# Patient Record
Sex: Male | Born: 1960 | Race: Black or African American | Hispanic: No | Marital: Married | Smoking: Never smoker
Health system: Southern US, Community
[De-identification: ages and names within clinical notes are randomized; demographics above are authoritative.]

## PROBLEM LIST (undated history)

## (undated) DIAGNOSIS — T7840XA Allergy, unspecified, initial encounter: Secondary | ICD-10-CM

## (undated) DIAGNOSIS — K603 Anal fistula, unspecified: Secondary | ICD-10-CM

## (undated) DIAGNOSIS — M1990 Unspecified osteoarthritis, unspecified site: Secondary | ICD-10-CM

## (undated) DIAGNOSIS — I10 Essential (primary) hypertension: Secondary | ICD-10-CM

## (undated) DIAGNOSIS — Z87442 Personal history of urinary calculi: Secondary | ICD-10-CM

## (undated) HISTORY — PX: HERNIA REPAIR: SHX51

## (undated) HISTORY — DX: Unspecified osteoarthritis, unspecified site: M19.90

## (undated) HISTORY — DX: Allergy, unspecified, initial encounter: T78.40XA

## (undated) HISTORY — DX: Essential (primary) hypertension: I10

---

## 1998-08-07 ENCOUNTER — Emergency Department (HOSPITAL_COMMUNITY): Admission: EM | Admit: 1998-08-07 | Discharge: 1998-08-07 | Payer: Self-pay | Admitting: Emergency Medicine

## 1998-08-07 ENCOUNTER — Encounter: Payer: Self-pay | Admitting: Emergency Medicine

## 2011-03-08 ENCOUNTER — Ambulatory Visit (INDEPENDENT_AMBULATORY_CARE_PROVIDER_SITE_OTHER): Payer: BC Managed Care – PPO

## 2011-03-08 DIAGNOSIS — R609 Edema, unspecified: Secondary | ICD-10-CM

## 2011-11-27 ENCOUNTER — Ambulatory Visit: Payer: BC Managed Care – PPO

## 2011-11-27 ENCOUNTER — Ambulatory Visit (INDEPENDENT_AMBULATORY_CARE_PROVIDER_SITE_OTHER): Payer: BC Managed Care – PPO | Admitting: Family Medicine

## 2011-11-27 VITALS — BP 135/83 | HR 86 | Temp 98.0°F | Resp 18 | Ht 71.0 in | Wt 257.0 lb

## 2011-11-27 DIAGNOSIS — N200 Calculus of kidney: Secondary | ICD-10-CM

## 2011-11-27 DIAGNOSIS — M79672 Pain in left foot: Secondary | ICD-10-CM

## 2011-11-27 DIAGNOSIS — L0231 Cutaneous abscess of buttock: Secondary | ICD-10-CM

## 2011-11-27 DIAGNOSIS — M79609 Pain in unspecified limb: Secondary | ICD-10-CM

## 2011-11-27 DIAGNOSIS — R109 Unspecified abdominal pain: Secondary | ICD-10-CM

## 2011-11-27 LAB — POCT UA - MICROSCOPIC ONLY
Bacteria, U Microscopic: NEGATIVE
Casts, Ur, LPF, POC: NEGATIVE
Crystals, Ur, HPF, POC: NEGATIVE
Epithelial cells, urine per micros: NEGATIVE
Yeast, UA: NEGATIVE

## 2011-11-27 LAB — POCT URINALYSIS DIPSTICK
Bilirubin, UA: NEGATIVE
Blood, UA: NEGATIVE
Glucose, UA: NEGATIVE
Ketones, UA: NEGATIVE
Leukocytes, UA: NEGATIVE
Nitrite, UA: NEGATIVE
Protein, UA: NEGATIVE
Spec Grav, UA: 1.025
Urobilinogen, UA: 0.2
pH, UA: 5.5

## 2011-11-27 IMAGING — CR DG ABDOMEN 1V
1 series · 1 of 1 positions shown · non-contrast
Comparison: None.

CLINICAL DATA: Left sided abdominal pain, history of kidney stones

ABDOMEN - 1 VIEW

[AP]
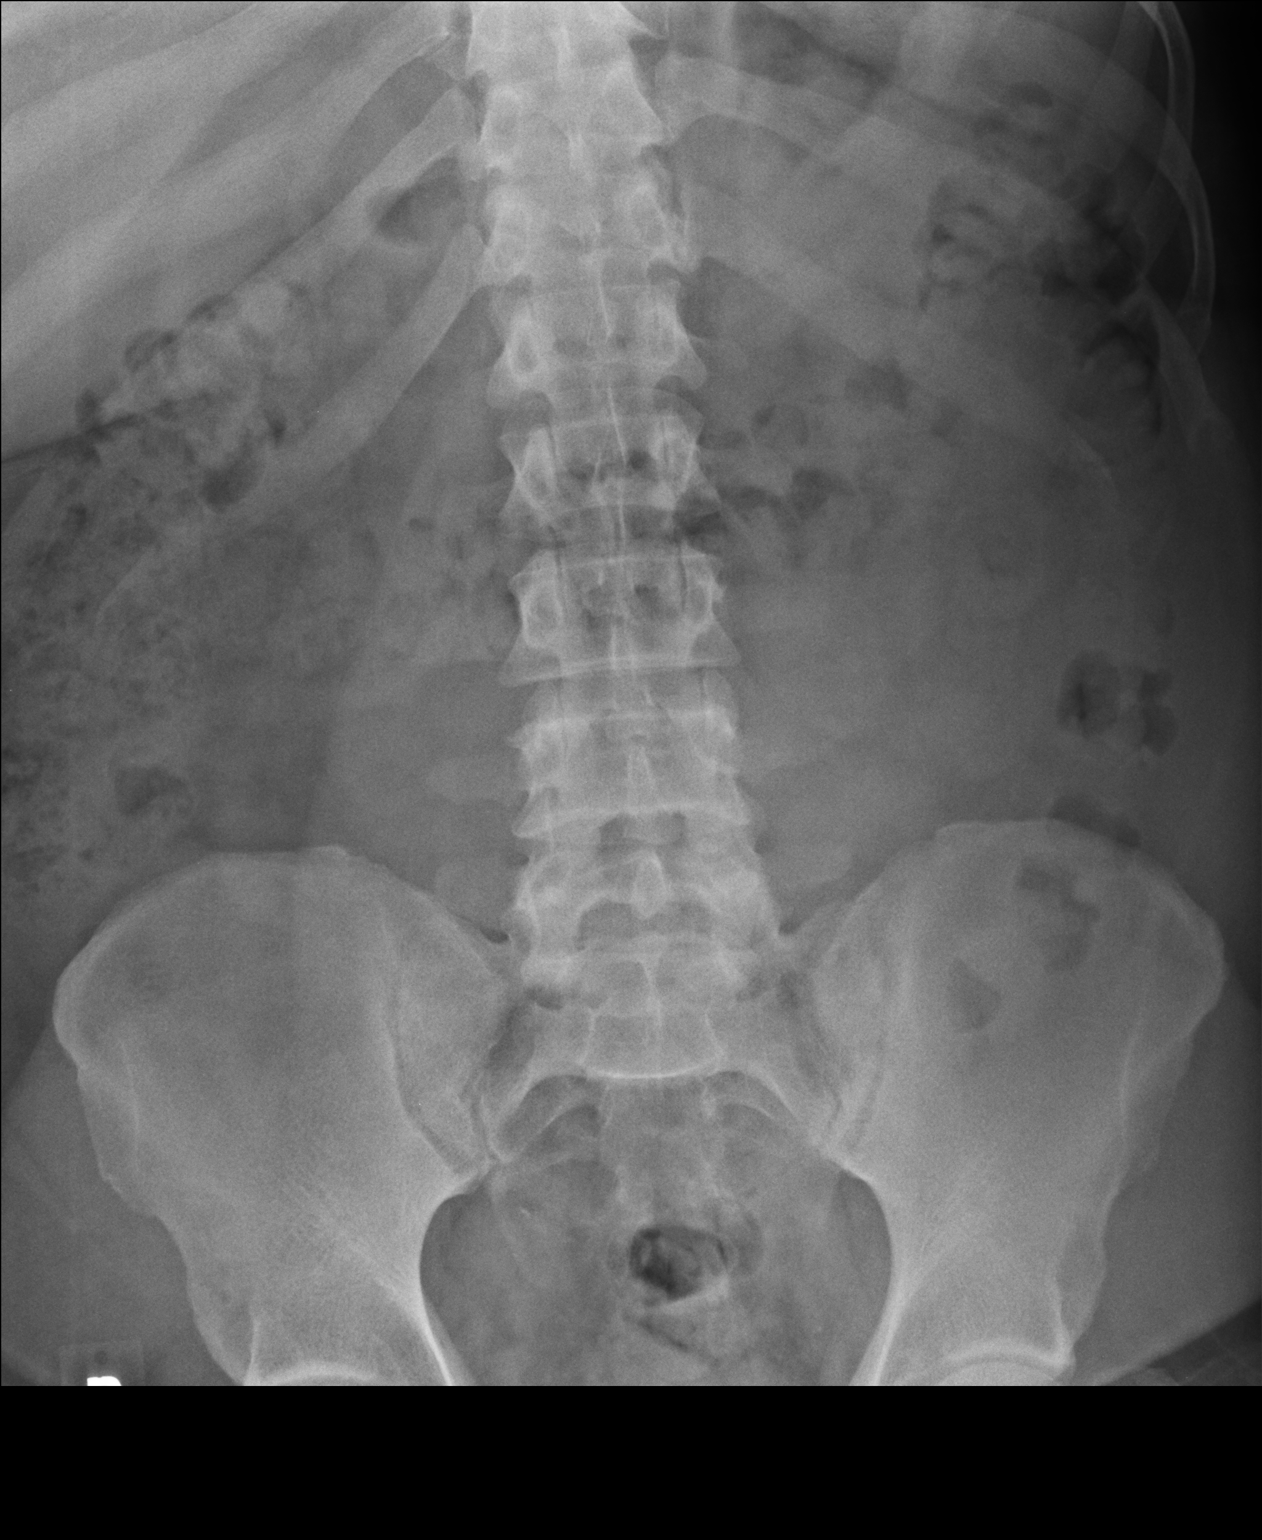

[1 of 1 positions shown; findings below may reference images not displayed]

FINDINGS: A supine film of the abdomen shows a nonspecific bowel
gas pattern.  The kidneys are overlain by bowel gas and feces
making assessment of calculi difficult, but no definite renal
calculi are seen.  No calcifications are seen along the expected
courses of the ureters.  No bony abnormality is seen.
IMPRESSION: No bowel obstruction.  No opaque calculi are noted.

Clinically significant discrepancy from primary report, if
provided: None

## 2011-11-27 MED ORDER — PREDNISONE 20 MG PO TABS: ORAL_TABLET | ORAL | Status: AC

## 2011-11-27 MED ORDER — DOXYCYCLINE HYCLATE 100 MG PO TABS: 100.0000 mg | ORAL_TABLET | Freq: Two times a day (BID) | ORAL | Status: AC

## 2011-11-27 NOTE — Progress Notes (Addendum)
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  This summer he passed some kidney stones.  He still has some left flank pain.  He has had some blood in urine.   Objective: NAD Foot exam:  Normal bony architecture, normal skin, good DP, nontender without STS  Results for orders placed in visit on 11/27/11  POCT URINALYSIS DIPSTICK      Component Value Range   Color, UA yellow     Clarity, UA clear     Glucose, UA neg     Bilirubin, UA neg     Ketones, UA neg     Spec Grav, UA 1.025     Blood, UA neg     pH, UA 5.5     Protein, UA neg     Urobilinogen, UA 0.2     Nitrite, UA neg     Leukocytes, UA Negative    POCT UA - MICROSCOPIC ONLY      Component Value Range   WBC, Ur, HPF, POC 2-5     RBC, urine, microscopic 0-2     Bacteria, U Microscopic neg     Mucus, UA small     Epithelial cells, urine per micros neg     Crystals, Ur, HPF, POC neg     Casts, Ur, LPF, POC neg     Yeast, UA neg     3 mm left gluteal cleft pustule without induration  Assessment:  Metatarsalgia and flank pain; infected pustule left gluteal cleft  Plan: 1. Kidney stones  POCT urinalysis dipstick, POCT UA - Microscopic Only, DG Abd 1 View, Comprehensive metabolic panel  2. Foot pain, left  predniSONE (DELTASONE) 20 MG tablet  3. Abscess of buttock, left  doxycycline (VIBRA-TABS) 100 MG tablet   [facility] reading (PRIMARY) by  Dr. Milus Glazier:  Negative KUB.

## 2011-11-28 LAB — COMPREHENSIVE METABOLIC PANEL
ALT: 13 U/L (ref 0–35)
AST: 15 U/L (ref 0–37)
Albumin: 4.2 g/dL (ref 3.5–5.2)
Alkaline Phosphatase: 131 U/L — ABNORMAL HIGH (ref 39–117)
BUN: 11 mg/dL (ref 6–23)
CO2: 29 mEq/L (ref 19–32)
Calcium: 10.1 mg/dL (ref 8.4–10.5)
Chloride: 106 mEq/L (ref 96–112)
Creat: 1.1 mg/dL (ref 0.50–1.10)
Glucose, Bld: 91 mg/dL (ref 70–99)
Potassium: 4.6 mEq/L (ref 3.5–5.3)
Sodium: 142 mEq/L (ref 135–145)
Total Bilirubin: 0.4 mg/dL (ref 0.3–1.2)
Total Protein: 7.2 g/dL (ref 6.0–8.3)

## 2012-02-03 ENCOUNTER — Ambulatory Visit (INDEPENDENT_AMBULATORY_CARE_PROVIDER_SITE_OTHER): Payer: BC Managed Care – PPO | Admitting: Emergency Medicine

## 2012-02-03 VITALS — BP 139/85 | HR 84 | Temp 98.4°F | Resp 16 | Ht 71.0 in | Wt 265.6 lb

## 2012-02-03 DIAGNOSIS — Z0289 Encounter for other administrative examinations: Secondary | ICD-10-CM

## 2012-02-03 NOTE — Progress Notes (Signed)
Tuberculosis Risk Questionnaire  1. Were you born outside the [place] in one of the following parts of the world:    [place], [place], [place], [place] or [place]?  No  2. Have you traveled outside the [place] and lived for more than one month in one of the following parts of the world:  [place], [place], [place], [place] or [place]?  No  3. Do you have a compromised immune system such as from any of the following conditions:  HIV/AIDS, organ or bone marrow transplantation, diabetes, immunosuppressive   medicines (e.g. Prednisone, Remicaide), leukemia, lymphoma, cancer of the   head or neck, gastrectomy or jejunal bypass, end-stage renal disease (on   dialysis), or silicosis?  No    4. 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  Yes 1992  5. Have you ever been exposed to anyone with infectious tuberculosis?  No Tuberculosis Symptom Questionnaire  Do you currently have any of the following symptoms?  1. Unexplained cough lasting more than 3 weeks?No  Unexplained fever lasting more than 3 weeks. No   3. Night Sweats (sweating that leaves the bedclothes and sheets wet)   No  4. Shortness of Breath No  5. Chest Pain No  6. Unintentional weight loss  No  7. Unexplained fatigue (very tired for no reason) No

## 2012-02-03 NOTE — Progress Notes (Signed)
  Subjective:    Patient ID: Sean Valencia, male    DOB: 16-Oct-1960, 51 y.o.   MRN: [id number]  HPI  Malen Gauze parent physical   Review of Systems    As per HPI, otherwise negative.   Objective:   Physical Exam  GEN: WDWN, NAD, Non-toxic, A & O x 3 HEENT: Atraumatic, Normocephalic. Neck supple. No masses, No LAD. Ears and Nose: No external deformity. CV: RRR, No M/G/R. No JVD. No thrill. No extra heart sounds. PULM: CTA B, no wheezes, crackles, rhonchi. No retractions. No resp. distress. No accessory muscle use. ABD: S, NT, ND, +BS. No rebound. No HSM. EXTR: No c/c/e NEURO Normal gait.  PSYCH: Normally interactive. Conversant. Not depressed or anxious appearing.  Calm demeanor.        Assessment & Plan:   TB screening accomplished Paperwork completed

## 2012-02-04 ENCOUNTER — Encounter: Payer: Self-pay | Admitting: Radiology

## 2012-02-05 NOTE — Progress Notes (Signed)
Reviewed and agree.

## 2013-06-14 ENCOUNTER — Ambulatory Visit: Payer: BC Managed Care – PPO

## 2013-06-14 ENCOUNTER — Ambulatory Visit (INDEPENDENT_AMBULATORY_CARE_PROVIDER_SITE_OTHER): Payer: BC Managed Care – PPO | Admitting: Internal Medicine

## 2013-06-14 VITALS — BP 138/82 | HR 86 | Temp 97.8°F | Resp 16 | Ht 70.5 in | Wt 257.0 lb

## 2013-06-14 DIAGNOSIS — R350 Frequency of micturition: Secondary | ICD-10-CM

## 2013-06-14 DIAGNOSIS — R1032 Left lower quadrant pain: Secondary | ICD-10-CM

## 2013-06-14 DIAGNOSIS — K603 Anal fistula: Secondary | ICD-10-CM

## 2013-06-14 DIAGNOSIS — Z1211 Encounter for screening for malignant neoplasm of colon: Secondary | ICD-10-CM

## 2013-06-14 DIAGNOSIS — Z87442 Personal history of urinary calculi: Secondary | ICD-10-CM

## 2013-06-14 DIAGNOSIS — R309 Painful micturition, unspecified: Secondary | ICD-10-CM

## 2013-06-14 LAB — POCT CBC
Granulocyte percent: 61.1 %G (ref 37–80)
HCT, POC: 44.9 % (ref 43.5–53.7)
HEMOGLOBIN: 14.3 g/dL (ref 14.1–18.1)
LYMPH, POC: 2.2 (ref 0.6–3.4)
MCH: 28.2 pg (ref 27–31.2)
MCHC: 31.8 g/dL (ref 31.8–35.4)
MCV: 88.5 fL (ref 80–97)
MID (cbc): 0.5 (ref 0–0.9)
MPV: 9.2 fL (ref 0–99.8)
POC GRANULOCYTE: 4.2 (ref 2–6.9)
POC LYMPH %: 31.3 % (ref 10–50)
POC MID %: 7.6 %M (ref 0–12)
Platelet Count, POC: 314 10*3/uL (ref 142–424)
RBC: 5.07 M/uL (ref 4.69–6.13)
RDW, POC: 14 %
WBC: 6.9 10*3/uL (ref 4.6–10.2)

## 2013-06-14 LAB — COMPREHENSIVE METABOLIC PANEL
ALBUMIN: 3.8 g/dL (ref 3.5–5.2)
ALT: 10 U/L (ref 0–53)
AST: 12 U/L (ref 0–37)
Alkaline Phosphatase: 113 U/L (ref 39–117)
BUN: 9 mg/dL (ref 6–23)
CALCIUM: 9.3 mg/dL (ref 8.4–10.5)
CO2: 27 mEq/L (ref 19–32)
CREATININE: 1.07 mg/dL (ref 0.50–1.35)
Chloride: 105 mEq/L (ref 96–112)
GLUCOSE: 117 mg/dL — AB (ref 70–99)
POTASSIUM: 4.3 meq/L (ref 3.5–5.3)
Sodium: 139 mEq/L (ref 135–145)
Total Bilirubin: 0.4 mg/dL (ref 0.2–1.2)
Total Protein: 6.8 g/dL (ref 6.0–8.3)

## 2013-06-14 LAB — POCT URINALYSIS DIPSTICK
Bilirubin, UA: NEGATIVE
Blood, UA: NEGATIVE
Glucose, UA: NEGATIVE
Ketones, UA: NEGATIVE
LEUKOCYTES UA: NEGATIVE
Nitrite, UA: NEGATIVE
PROTEIN UA: NEGATIVE
Spec Grav, UA: 1.02
Urobilinogen, UA: 0.2
pH, UA: 6.5

## 2013-06-14 LAB — POCT UA - MICROSCOPIC ONLY
Casts, Ur, LPF, POC: NEGATIVE
Crystals, Ur, HPF, POC: NEGATIVE
Mucus, UA: NEGATIVE
Yeast, UA: NEGATIVE

## 2013-06-14 LAB — IFOBT (OCCULT BLOOD): IMMUNOLOGICAL FECAL OCCULT BLOOD TEST: NEGATIVE

## 2013-06-14 LAB — PSA: PSA: 0.56 ng/mL (ref <=4.00)

## 2013-06-14 IMAGING — CR DG ABDOMEN 1V
2 series · 2 of 2 positions shown · non-contrast
Comparison: [DATE]

CLINICAL DATA: Abdominal pain.

EXAM:
ABDOMEN - 1 VIEW

[AP (1 of 2)]
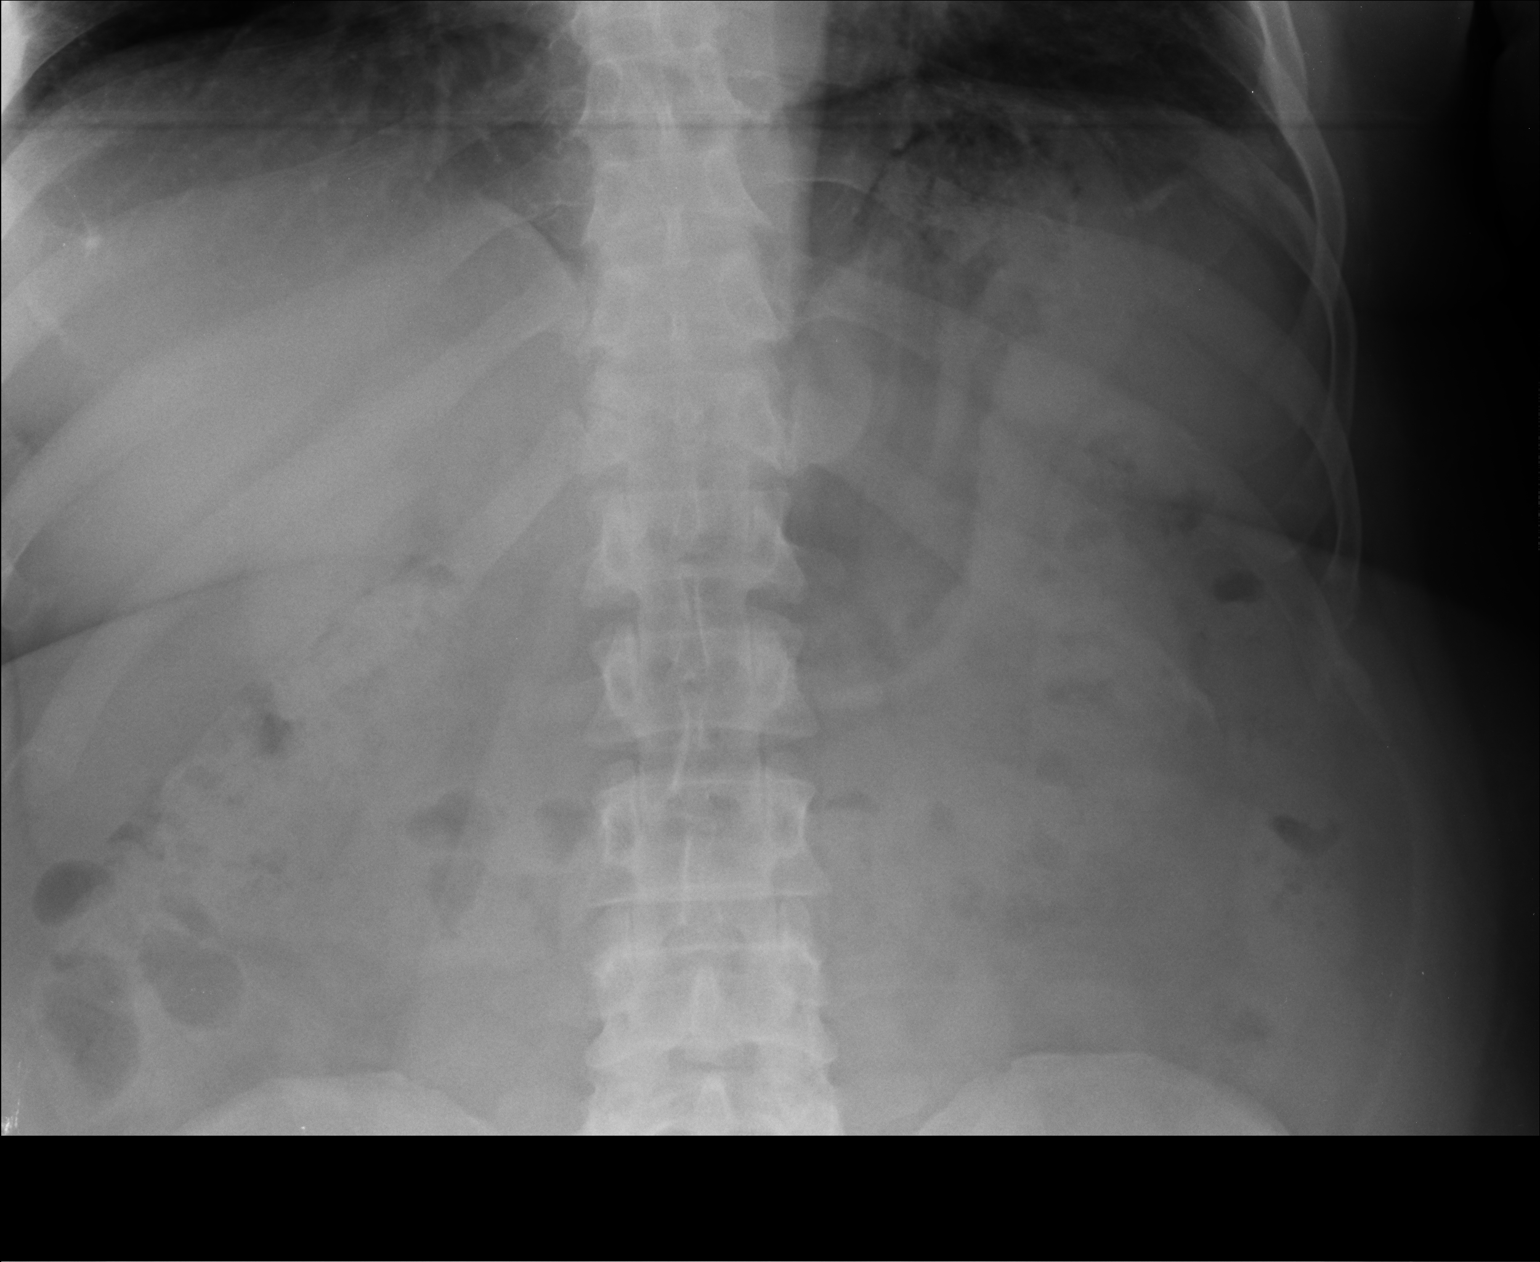

[AP (2 of 2)]
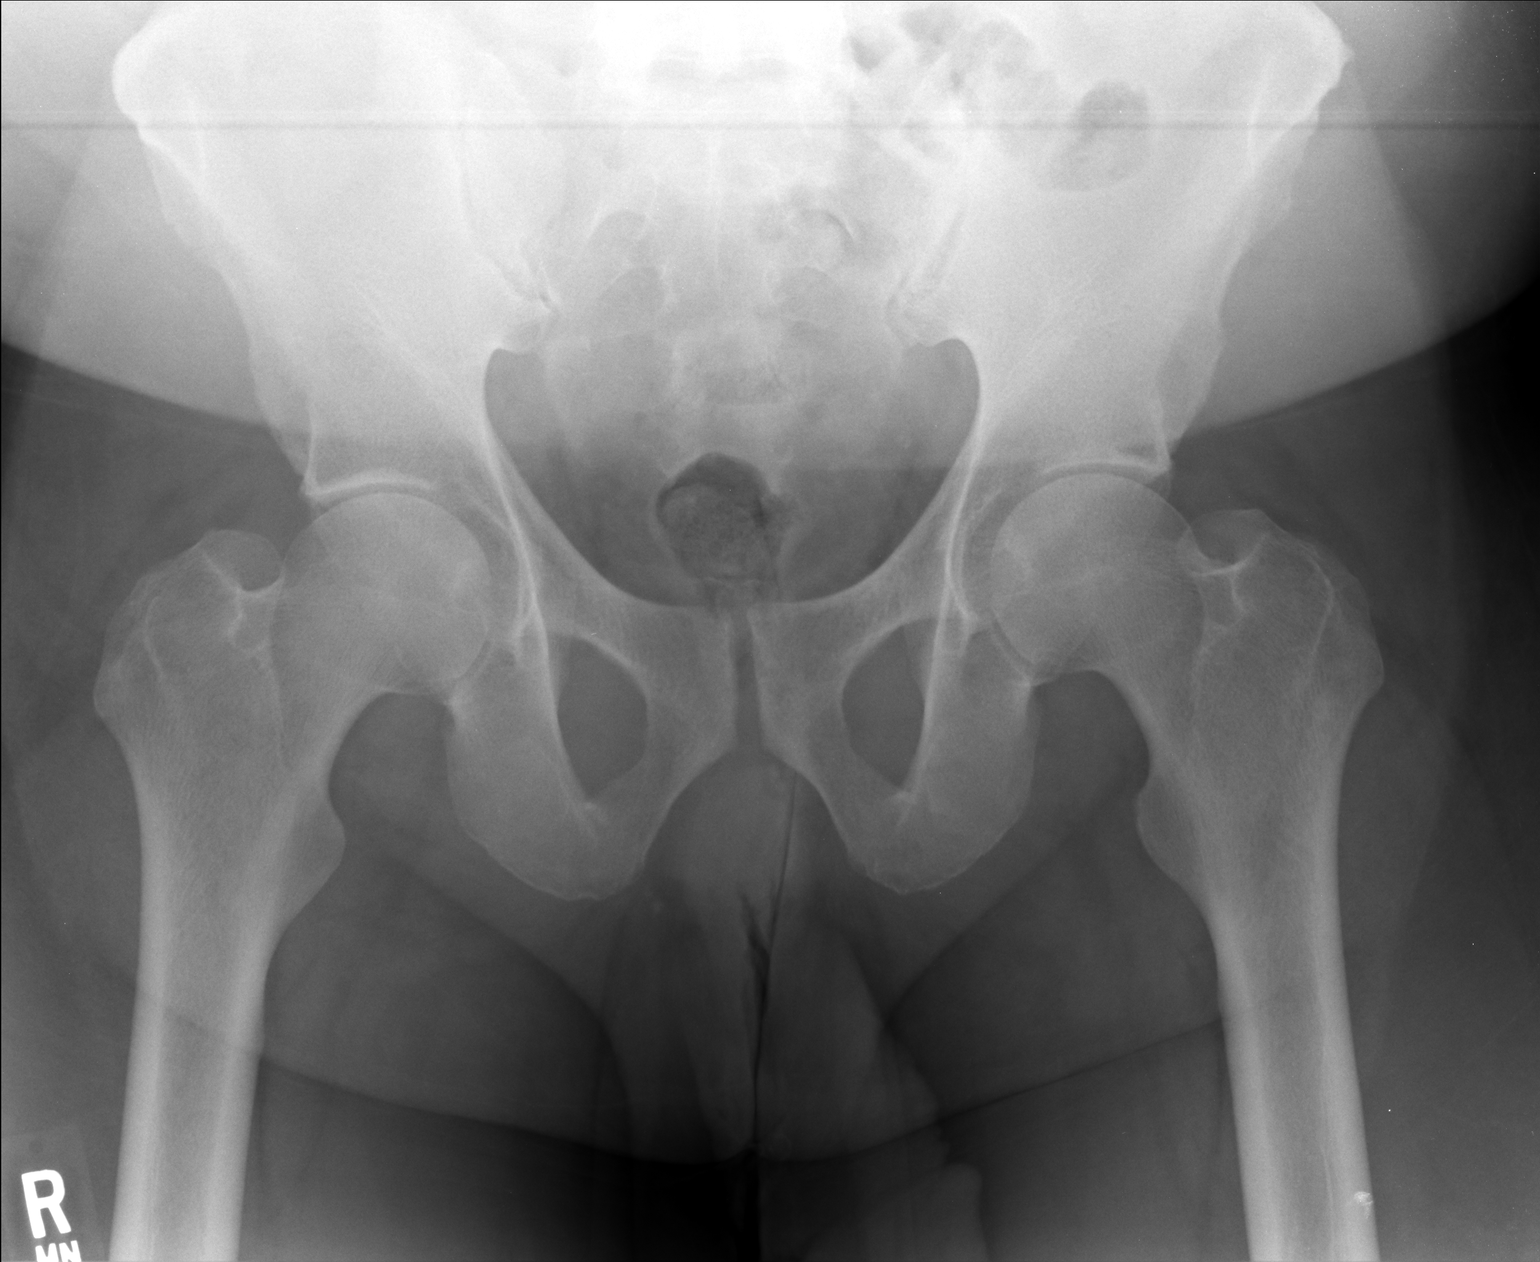

[2 of 2 positions shown; findings below may reference images not displayed]

FINDINGS: Small calcification in the left side of the anatomic pelvis with rim
calcification, most compatible with phlebolith. I see no suspicious
calcification projecting over the kidneys or expected course of the
ureters. Nonobstructive bowel gas pattern. No free air organomegaly.
IMPRESSION: No suspicious calcifications.  No acute findings.

## 2013-06-14 NOTE — Progress Notes (Addendum)
Subjective:   This chart was scribed for Sean Lin, MD by Forrestine Him, [facility] Scribe. This patient was seen in room 2 and the patient's care was started 11:00 AM.    Patient ID: Sean Valencia, male    DOB: [birth year], 53 y.o.   MRN: [id number]  Chief Complaint  Patient presents with  . Nephrolithiasis    x 2 wks  . Prostate Check     HPI  HPI Comments: Sean Valencia is a 53 y.o. male who presents to [facility] complaining of nephrolithiasis x 3 weeks that is progressively worsening. Pt reports dysuria, pain to the left groin area, and frequency. Pt admits to a history of kidney stones, and feels his symptoms are consistent with what he experiences when a stone is passing. Denies any prostate abnormalities. At this time he denies any hematuria, trouble starting a urine stream, or penile discharge. Pt has no pertinent medical history. No other concerns this visit.  He has never actually been diagnosed with a kidney stone. He has had no imaging procedures. He also has had relatively little medical care in the last 5 years.   History reviewed. No pertinent past medical history.   Review of Systems  Constitutional: Negative for fever, chills, fatigue and unexpected weight change.  HENT: Negative for congestion and trouble swallowing.   Eyes: Negative for visual disturbance.  Respiratory: Negative for cough.   Cardiovascular: Negative for chest pain, palpitations and leg swelling.  Gastrointestinal: Negative for diarrhea, constipation and rectal pain.       He describes a chronic problem with a rectal discharge that is purulent and foul smelling. He has identified in the past some sort of persistent "bump" and has been treated with antibiotics though it did not work. This has never been thoroughly evaluated.  Genitourinary: Positive for dysuria and frequency. Negative for urgency, hematuria, flank pain, decreased urine volume,  discharge, scrotal swelling, penile pain and testicular pain.  Skin: Negative for rash.  Psychiatric/Behavioral: Negative for confusion.    Triage Vitals: BP 138/82  Pulse 86  Temp(Src) 97.8 F (36.6 C) (Oral)  Resp 16  Ht 5' 10.5" (1.791 m)  Wt 257 lb (116.574 kg)  BMI 36.34 kg/m2  SpO2 99%   Objective:   Physical Exam  Nursing note and vitals reviewed. Constitutional: He is oriented to person, place, and time. He appears well-developed and well-nourished.  HENT:  Head: Normocephalic and atraumatic.  Eyes: EOM are normal. Pupils are equal, round, and reactive to light.  Neck: Normal range of motion. Neck supple.  Cardiovascular: Normal rate, regular rhythm and normal heart sounds.   Pulmonary/Chest: Effort normal and breath sounds normal.  Abdominal: Soft. Bowel sounds are normal. He exhibits no mass. There is tenderness. There is no rebound and no guarding.  Slightly tender is LLQ  Genitourinary: Rectum normal, prostate normal and penis normal.  No CVA tenderness to percussion No tenderness to Left inguinal area At 9:00 there is a fistula that is draining pus but there is no induration redness or tenderness. There are no rectal masses. The prostate has no nodules, is symmetrical and soft and nontender.  Musculoskeletal: Normal range of motion.  Neurological: He is alert and oriented to person, place, and time.  Skin: Skin is warm and dry.  Psychiatric: He has a normal mood and affect. His behavior is normal.   [facility] reading (PRIMARY) by  Dr. Laney Pastor? Stone at [facility]  wnl   Assessment & Plan:  History of kidney stones -   Painful urination -  Urinary frequency -   Abdominal pain, left lower quadrant -  Anal fistula - Plan: Ambulatory referral to General Surgery  Special screening for malignant neoplasms, colon - Plan: Ambulatory referral to Gastroenterology  With an unclear diagnosis no medications are prescribed. Laboratory pending.  I personally  performed the services described in this documentation, which was scribed in my presence. The recorded information has been reviewed and is accurate.I have completed the patient encounter in its entirety as documented by the scribe, with editing by me where necessary. Dace Denn P. Laney Pastor, M.D.    Addendum- Results for orders placed in visit on 06/14/13  COMPREHENSIVE METABOLIC PANEL      Result Value Ref Range   Sodium 139  135 - 145 mEq/L   Potassium 4.3  3.5 - 5.3 mEq/L   Chloride 105  96 - 112 mEq/L   CO2 27  19 - 32 mEq/L   Glucose, Bld 117 (*) 70 - 99 mg/dL   BUN 9  6 - 23 mg/dL   Creat 1.07  0.50 - 1.35 mg/dL   Total Bilirubin 0.4  0.2 - 1.2 mg/dL   Alkaline Phosphatase 113  39 - 117 U/L   AST 12  0 - 37 U/L   ALT 10  0 - 53 U/L   Total Protein 6.8  6.0 - 8.3 g/dL   Albumin 3.8  3.5 - 5.2 g/dL   Calcium 9.3  8.4 - 10.5 mg/dL  PSA      Result Value Ref Range   PSA 0.56  <=4.00 ng/mL  POCT UA - MICROSCOPIC ONLY      Result Value Ref Range   WBC, Ur, HPF, POC 0-1     RBC, urine, microscopic 0-1     Bacteria, U Microscopic trace     Mucus, UA neg     Epithelial cells, urine per micros 0-1     Crystals, Ur, HPF, POC neg     Casts, Ur, LPF, POC neg     Yeast, UA neg    POCT URINALYSIS DIPSTICK      Result Value Ref Range   Color, UA yellow     Clarity, UA clear     Glucose, UA neg     Bilirubin, UA neg     Ketones, UA neg     Spec Grav, UA 1.020     Blood, UA neg     pH, UA 6.5     Protein, UA neg     Urobilinogen, UA 0.2     Nitrite, UA neg     Leukocytes, UA Negative    POCT CBC      Result Value Ref Range   WBC 6.9  4.6 - 10.2 K/uL   Lymph, poc 2.2  0.6 - 3.4   POC LYMPH PERCENT 31.3  10 - 50 %L   MID (cbc) 0.5  0 - 0.9   POC MID % 7.6  0 - 12 %M   POC Granulocyte 4.2  2 - 6.9   Granulocyte percent 61.1  37 - 80 %G   RBC 5.07  4.69 - 6.13 M/uL   Hemoglobin 14.3  14.1 - 18.1 g/dL   HCT, POC 44.9  43.5 - 53.7 %   MCV 88.5  80 - 97 fL   MCH, POC 28.2  27  - 31.2 pg   MCHC 31.8  31.8 -  35.4 g/dL   RDW, POC 14.0     Platelet Count, POC 314  142 - 424 K/uL   MPV 9.2  0 - 99.8 fL  IFOBT (OCCULT BLOOD)      Result Value Ref Range   IFOBT Negative     Radiology did not see a stone There is no etiology found for his urinary symptoms and so we will set up a urological evaluation after his general surgery evaluation as the fistula seems like a more urgent problem. He also needs a health maintenance evaluation and we will set up an appointment w/ dr Antonietta Jewel at [phone]

## 2013-06-15 LAB — URINE CULTURE
COLONY COUNT: NO GROWTH
Organism ID, Bacteria: NO GROWTH

## 2013-06-20 NOTE — Progress Notes (Signed)
Left message for patient to call back  

## 2013-07-04 ENCOUNTER — Ambulatory Visit (INDEPENDENT_AMBULATORY_CARE_PROVIDER_SITE_OTHER): Payer: BC Managed Care – PPO | Admitting: Surgery

## 2013-07-07 ENCOUNTER — Encounter (INDEPENDENT_AMBULATORY_CARE_PROVIDER_SITE_OTHER): Payer: Self-pay | Admitting: Surgery

## 2013-07-17 ENCOUNTER — Encounter: Payer: Self-pay | Admitting: Internal Medicine

## 2013-08-08 ENCOUNTER — Ambulatory Visit (INDEPENDENT_AMBULATORY_CARE_PROVIDER_SITE_OTHER): Payer: BC Managed Care – PPO | Admitting: Internal Medicine

## 2013-08-08 VITALS — BP 144/78 | HR 81 | Temp 98.2°F | Resp 16 | Ht 70.0 in | Wt 259.0 lb

## 2013-08-08 DIAGNOSIS — M545 Low back pain, unspecified: Secondary | ICD-10-CM

## 2013-08-08 DIAGNOSIS — H1010 Acute atopic conjunctivitis, unspecified eye: Secondary | ICD-10-CM

## 2013-08-08 DIAGNOSIS — H1045 Other chronic allergic conjunctivitis: Secondary | ICD-10-CM

## 2013-08-08 MED ORDER — OLOPATADINE HCL 0.2 % OP SOLN: 1.0000 [drp] | Freq: Every day | OPHTHALMIC | Status: DC
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
## 2013-10-18 ENCOUNTER — Ambulatory Visit (INDEPENDENT_AMBULATORY_CARE_PROVIDER_SITE_OTHER): Payer: BC Managed Care – PPO

## 2013-10-18 ENCOUNTER — Ambulatory Visit (INDEPENDENT_AMBULATORY_CARE_PROVIDER_SITE_OTHER): Payer: BC Managed Care – PPO | Admitting: Internal Medicine

## 2013-10-18 VITALS — BP 130/82 | HR 74 | Temp 98.1°F | Resp 16 | Ht 71.0 in | Wt 261.0 lb

## 2013-10-18 DIAGNOSIS — M79609 Pain in unspecified limb: Secondary | ICD-10-CM

## 2013-10-18 DIAGNOSIS — M79645 Pain in left finger(s): Secondary | ICD-10-CM

## 2013-10-18 IMAGING — CR DG FINGER LITTLE 2+V*L*
2 series · 2 of 2 positions shown · non-contrast
Comparison: None.

CLINICAL DATA: Left fifth digit pain

EXAM:
LEFT LITTLE FINGER 2+V

[PA]
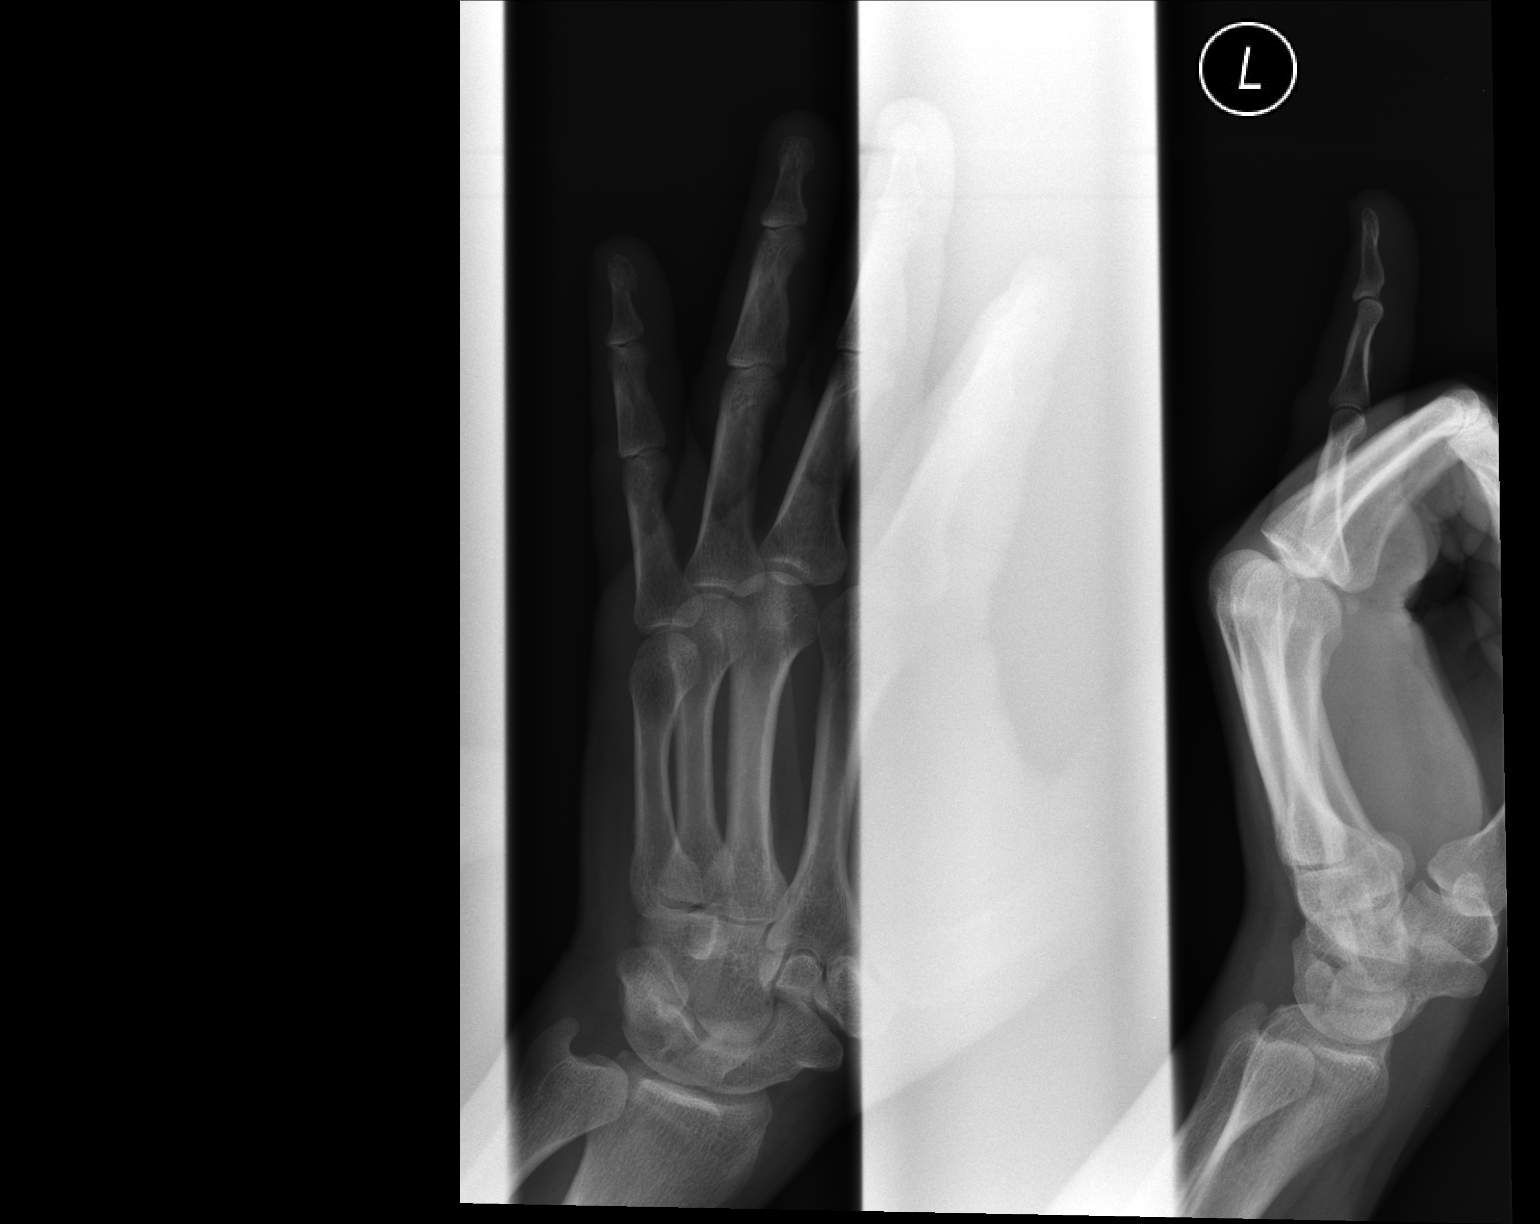

[pa obl]
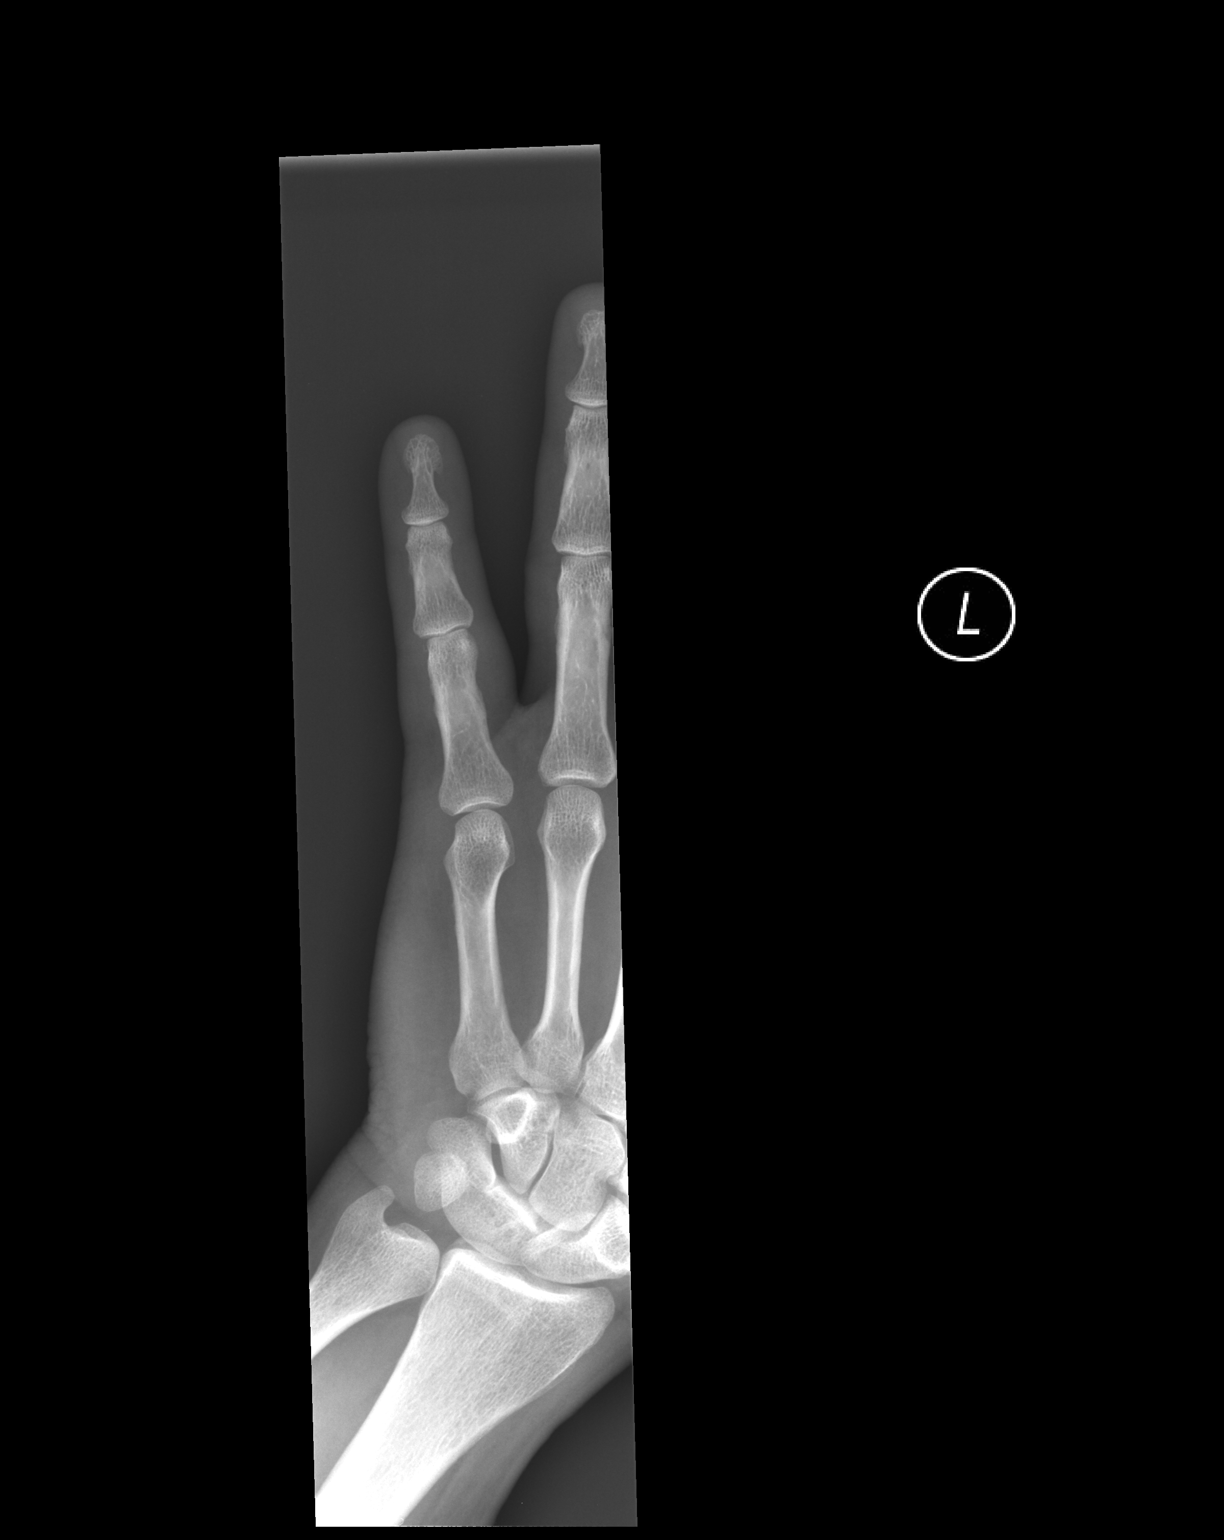

[2 of 2 positions shown; findings below may reference images not displayed]

FINDINGS: There is no evidence of fracture or dislocation. There is no
evidence of arthropathy or other focal bone abnormality. Soft
tissues are unremarkable.
IMPRESSION: Negative.

## 2013-10-18 NOTE — Progress Notes (Signed)
   Subjective:  This chart was scribed for Sean Lin, MD by Sean Valencia, Medical Scribe. This patient was seen in Room 5 and the patient's care was started at 9:15 AM.   Patient ID: Sean Valencia, male    DOB: Jan 14, 1961, 53 y.o.   MRN: [id number]  HPI HPI Comments: Sean Valencia is a 53 y.o. male who presents to the [facility] complaining of constant pain to his left fifth finger that started yesterday after he jammed his finger.    No past medical history on file. No past surgical history on file. No family history on file. History   Social History  . Marital Status: Married    Spouse Name: N/A    Number of Children: N/A  . Years of Education: N/A   Occupational History  . Not on file.   Social History Main Topics  . Smoking status: Never Smoker   . Smokeless tobacco: Never Used  . Alcohol Use: Yes  . Drug Use: No  . Sexual Activity: Yes    Birth Control/ Protection: None   Other Topics Concern  . Not on file   Social History Narrative  . No narrative on file   No Known Allergies  Review of Systems   Objective:  Physical Exam  Nursing note and vitals reviewed. Constitutional: He is oriented to person, place, and time. He appears well-developed and well-nourished. No distress.  HENT:  Head: Normocephalic and atraumatic.  Eyes: Conjunctivae and EOM are normal. Pupils are equal, round, and reactive to light.  Neck: Neck supple.  Cardiovascular: Normal rate.   Pulmonary/Chest: Effort normal.  Musculoskeletal:  Left fifth finger is displaced laterally with tenderness over the PIP and proximal phalanx and incomplete flexion though no malrotation.    Neurological: He is alert and oriented to person, place, and time. No cranial nerve deficit.  Psychiatric: He has a normal mood and affect. His behavior is normal.   [facility] preliminary x-ray report read by Dr. Laney Pastor: Left hand - no fractures at the PIP.    BP 130/82  Pulse 74   Temp(Src) 98.1 F (36.7 C) (Oral)  Resp 16  Ht 5\' 11"  (1.803 m)  Wt 261 lb (118.389 kg)  BMI 36.42 kg/m2  SpO2 98% Assessment & Plan:   Finger pain, left - Plan: DG Finger Little Left Sprain PIP  Buddy tape 3 weeks rom bid tid F/u if still has sl lat drift  I have completed the patient encounter in its entirety as documented by the scribe, with editing by me where necessary. Arkie Tagliaferro P. Laney Pastor, M.D.

## 2013-10-18 NOTE — Patient Instructions (Signed)
Use [place] to buddy tape 2 fingers. You may remove the tape when showering. After 2 weeks you may take off buddy tape in the mornings and soak finger in hot water and light make a fist a few times, then put the tape back on fingers. Use the buddy tape and therapy method for 4 weeks.

## 2014-01-22 ENCOUNTER — Ambulatory Visit (INDEPENDENT_AMBULATORY_CARE_PROVIDER_SITE_OTHER): Payer: BC Managed Care – PPO | Admitting: Emergency Medicine

## 2014-01-22 VITALS — BP 138/84 | HR 86 | Temp 98.3°F | Resp 16 | Ht 70.0 in | Wt 268.8 lb

## 2014-01-22 DIAGNOSIS — Z021 Encounter for pre-employment examination: Secondary | ICD-10-CM

## 2014-01-22 NOTE — Progress Notes (Signed)
[facility] [address], [place] [address] [phone]  Date:  01/22/2014   Name:  Sean Valencia   DOB:  [birth year]   MRN:  [id number]  PCP:  No PCP Per Patient    Chief Complaint: Employment Physical   History of Present Illness:  AC COLAN is a 53 y.o. very pleasant male patient who presents with the following:  Wellness exam   There are no active problems to display for this patient.   Past Medical History  Diagnosis Date  . Allergy     Past Surgical History  Procedure Laterality Date  . Hernia repair      History  Substance Use Topics  . Smoking status: Never Smoker   . Smokeless tobacco: Never Used  . Alcohol Use: Yes    Family History  Problem Relation Age of Onset  . Diabetes Father     No Known Allergies  Medication list has been reviewed and updated.  No current outpatient prescriptions on file prior to visit.   No current facility-administered medications on file prior to visit.    Review of Systems:  As per HPI, otherwise negative.    Physical Examination: Filed Vitals:   01/22/14 2012  BP: 138/84  Pulse: 86  Temp: 98.3 F (36.8 C)  Resp: 16   Filed Vitals:   01/22/14 2012  Height: 5\' 10"  (1.778 m)  Weight: 268 lb 12.8 oz (121.927 kg)   Body mass index is 38.57 kg/(m^2). Ideal Body Weight: Weight in (lb) to have BMI = 25: 173.9  GEN: WDWN, NAD, Non-toxic, A & O x 3 HEENT: Atraumatic, Normocephalic. Neck supple. No masses, No LAD. Ears and Nose: No external deformity. CV: RRR, No M/G/R. No JVD. No thrill. No extra heart sounds. PULM: CTA B, no wheezes, crackles, rhonchi. No retractions. No resp. distress. No accessory muscle use. ABD: S, NT, ND, +BS. No rebound. No HSM. EXTR: No c/c/e NEURO Normal gait.  PSYCH: Normally interactive. Conversant. Not depressed or anxious appearing.  Calm demeanor.    Assessment and Plan: Wellness exam  Signed,  Ellison Carwin, MD

## 2014-05-12 ENCOUNTER — Ambulatory Visit (INDEPENDENT_AMBULATORY_CARE_PROVIDER_SITE_OTHER): Payer: BC Managed Care – PPO

## 2014-05-12 ENCOUNTER — Ambulatory Visit (INDEPENDENT_AMBULATORY_CARE_PROVIDER_SITE_OTHER): Payer: BC Managed Care – PPO | Admitting: Family Medicine

## 2014-05-12 VITALS — BP 160/98 | HR 63 | Temp 98.0°F | Resp 16 | Ht 70.0 in | Wt 275.0 lb

## 2014-05-12 DIAGNOSIS — W19XXXA Unspecified fall, initial encounter: Secondary | ICD-10-CM

## 2014-05-12 DIAGNOSIS — M6249 Contracture of muscle, multiple sites: Secondary | ICD-10-CM

## 2014-05-12 DIAGNOSIS — M542 Cervicalgia: Secondary | ICD-10-CM

## 2014-05-12 DIAGNOSIS — I10 Essential (primary) hypertension: Secondary | ICD-10-CM

## 2014-05-12 DIAGNOSIS — S161XXS Strain of muscle, fascia and tendon at neck level, sequela: Secondary | ICD-10-CM

## 2014-05-12 DIAGNOSIS — M62838 Other muscle spasm: Secondary | ICD-10-CM

## 2014-05-12 IMAGING — CR DG CERVICAL SPINE 2 OR 3 VIEWS
2 series · 2 of 2 positions shown · non-contrast
Comparison: None.

CLINICAL DATA: Subacute neck pain following fall 1 month ago.
Initial encounter.

EXAM:
CERVICAL SPINE - 2-3 VIEW

[lateral]
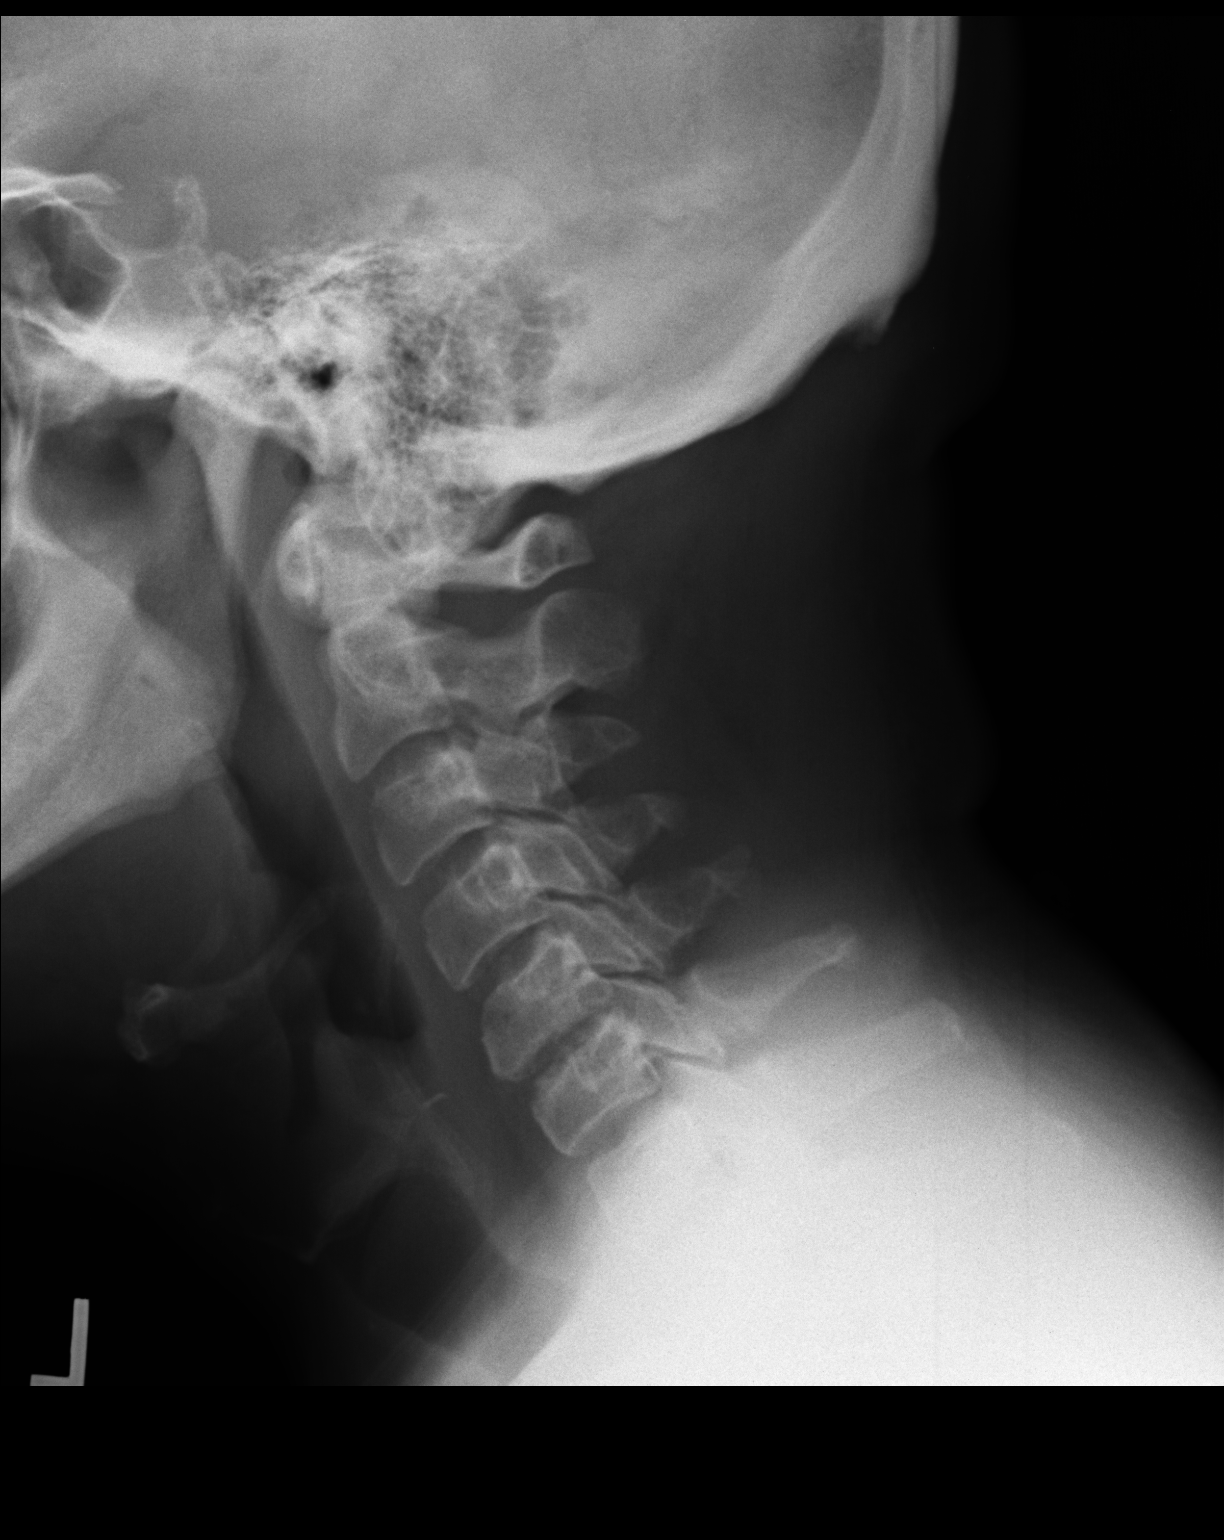

[AP]
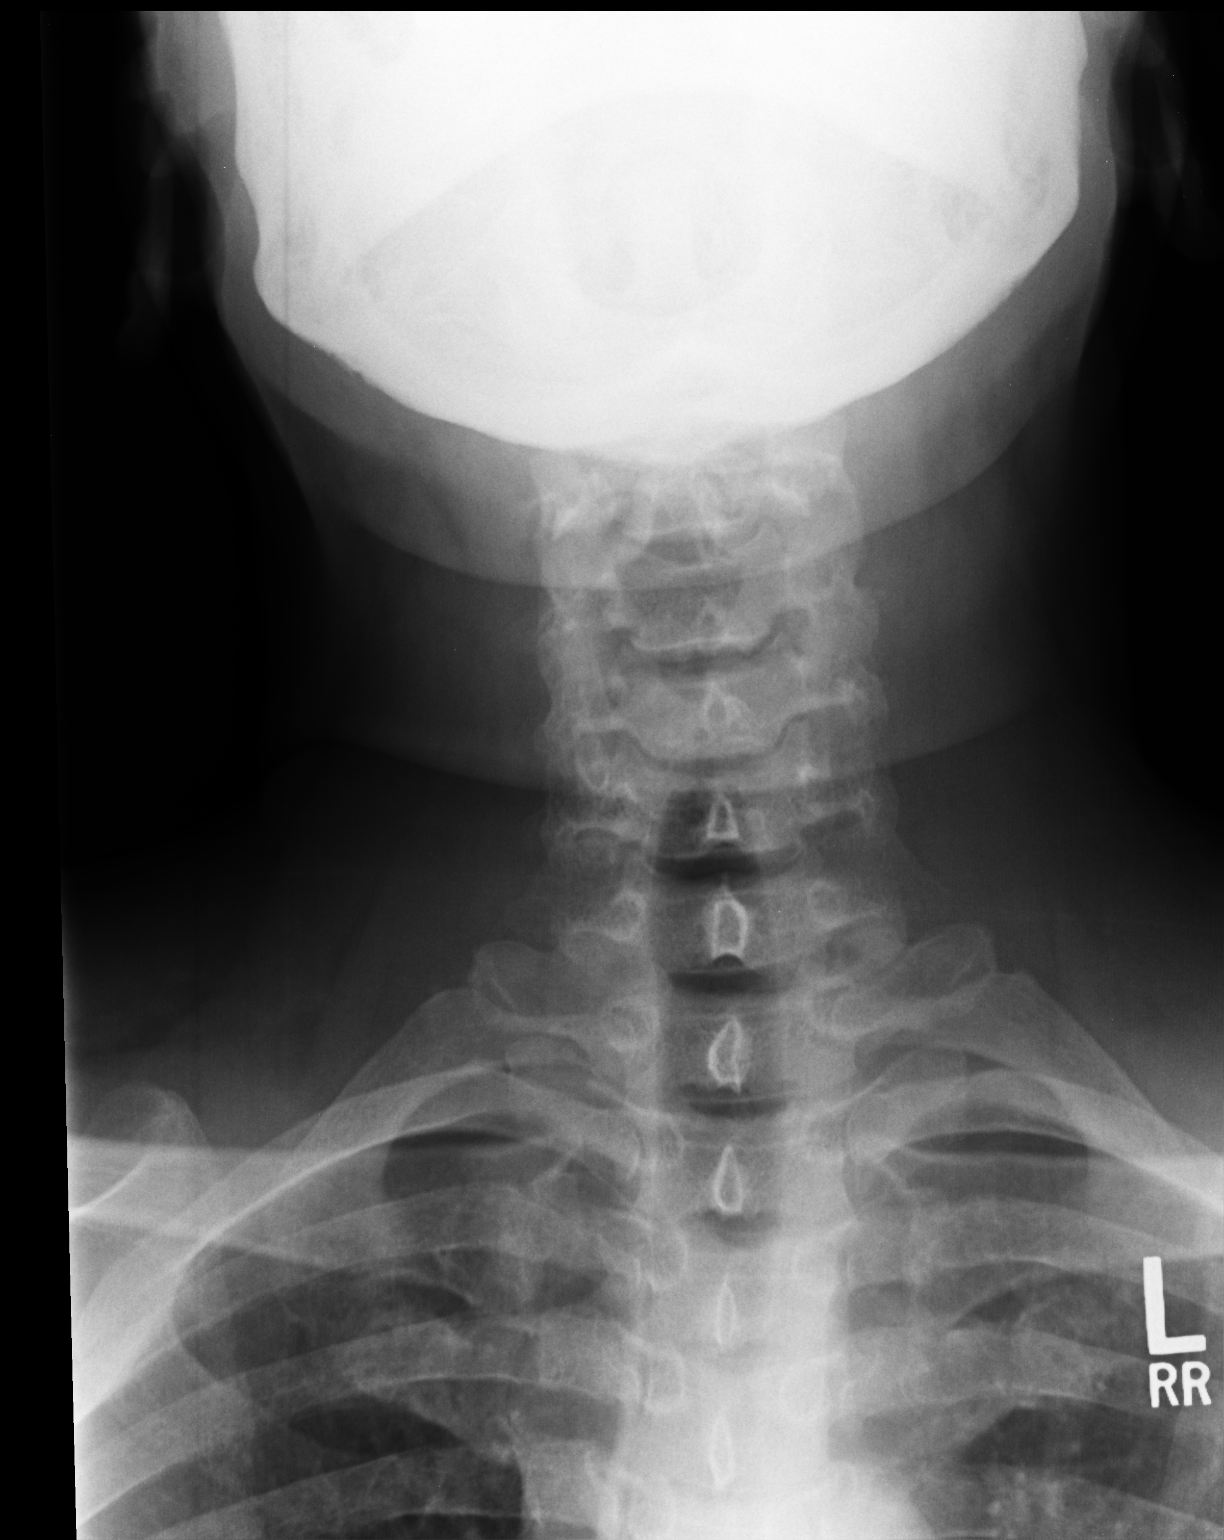

[2 of 2 positions shown; findings below may reference images not displayed]

FINDINGS: Normal alignment is noted.

There is no evidence of acute fracture, subluxation or prevertebral
soft tissue swelling.

Mild to moderate degenerative disc disease at C5-C6 is noted.

No focal bony abnormalities are noted.
IMPRESSION: No evidence of acute bony abnormality.

Mild to moderate degenerative disc disease at C5-C6.

## 2014-05-12 MED ORDER — CYCLOBENZAPRINE HCL 10 MG PO TABS
10.0000 mg | ORAL_TABLET | Freq: Three times a day (TID) | ORAL | Status: DC | PRN
Start: 2014-05-12 — End: 2016-11-16

## 2014-05-12 MED ORDER — TRAMADOL HCL 50 MG PO TABS: 50.0000 mg | ORAL_TABLET | Freq: Three times a day (TID) | ORAL | Status: DC | PRN

## 2014-05-12 MED ORDER — MELOXICAM 7.5 MG PO TABS: 7.5000 mg | ORAL_TABLET | Freq: Every day | ORAL | Status: DC

## 2014-05-12 NOTE — Patient Instructions (Addendum)
DASH Eating Plan DASH stands for "Dietary Approaches to Stop Hypertension." The DASH eating plan is a healthy eating plan that has been shown to reduce high blood pressure (hypertension). Additional health benefits may include reducing the risk of type 2 diabetes mellitus, heart disease, and stroke. The DASH eating plan may also help with weight loss. WHAT DO I NEED TO KNOW ABOUT THE DASH EATING PLAN? For the DASH eating plan, you will follow these general guidelines:  Choose foods with a percent daily value for sodium of less than 5% (as listed on the food label).  Use salt-free seasonings or herbs instead of table salt or sea salt.  Check with your health care provider or pharmacist before using salt substitutes.  Eat lower-sodium products, often labeled as "lower sodium" or "no salt added."  Eat fresh foods.  Eat more vegetables, fruits, and low-fat dairy products.  Choose whole grains. Look for the word "whole" as the first word in the ingredient list.  Choose fish and skinless chicken or turkey more often than red meat. Limit fish, poultry, and meat to 6 oz (170 g) each day.  Limit sweets, desserts, sugars, and sugary drinks.  Choose heart-healthy fats.  Limit cheese to 1 oz (28 g) per day.  Eat more home-cooked food and less restaurant, buffet, and fast food.  Limit fried foods.  Cook foods using methods other than frying.  Limit canned vegetables. If you do use them, rinse them well to decrease the sodium.  When eating at a restaurant, ask that your food be prepared with less salt, or no salt if possible. WHAT FOODS CAN I EAT? Seek help from a dietitian for individual calorie needs. Grains Whole grain or whole wheat bread. Brown rice. Whole grain or whole wheat pasta. Quinoa, bulgur, and whole grain cereals. Low-sodium cereals. Corn or whole wheat flour tortillas. Whole grain cornbread. Whole grain crackers. Low-sodium crackers. Vegetables Fresh or frozen vegetables  (raw, steamed, roasted, or grilled). Low-sodium or reduced-sodium tomato and vegetable juices. Low-sodium or reduced-sodium tomato sauce and paste. Low-sodium or reduced-sodium canned vegetables.  Fruits All fresh, canned (in natural juice), or frozen fruits. Meat and Other Protein Products Ground beef (85% or leaner), grass-fed beef, or beef trimmed of fat. Skinless chicken or turkey. Ground chicken or turkey. Pork trimmed of fat. All fish and seafood. Eggs. Dried beans, peas, or lentils. Unsalted nuts and seeds. Unsalted canned beans. Dairy Low-fat dairy products, such as skim or 1% milk, 2% or reduced-fat cheeses, low-fat ricotta or cottage cheese, or plain low-fat yogurt. Low-sodium or reduced-sodium cheeses. Fats and Oils Tub margarines without trans fats. Light or reduced-fat mayonnaise and salad dressings (reduced sodium). Avocado. Safflower, olive, or canola oils. Natural peanut or almond butter. Other Unsalted popcorn and pretzels. The items listed above may not be a complete list of recommended foods or beverages. Contact your dietitian for more options. WHAT FOODS ARE NOT RECOMMENDED? Grains White bread. White pasta. White rice. Refined cornbread. Bagels and croissants. Crackers that contain trans fat. Vegetables Creamed or fried vegetables. Vegetables in a cheese sauce. Regular canned vegetables. Regular canned tomato sauce and paste. Regular tomato and vegetable juices. Fruits Dried fruits. Canned fruit in light or heavy syrup. Fruit juice. Meat and Other Protein Products Fatty cuts of meat. Ribs, chicken wings, bacon, sausage, bologna, salami, chitterlings, fatback, hot dogs, bratwurst, and packaged luncheon meats. Salted nuts and seeds. Canned beans with salt. Dairy Whole or 2% milk, cream, half-and-half, and cream cheese. Whole-fat or sweetened yogurt. Full-fat   cheeses or blue cheese. Nondairy creamers and whipped toppings. Processed cheese, cheese spreads, or cheese  curds. Condiments Onion and garlic salt, seasoned salt, table salt, and sea salt. Canned and packaged gravies. Worcestershire sauce. Tartar sauce. Barbecue sauce. Teriyaki sauce. Soy sauce, including reduced sodium. Steak sauce. Fish sauce. Oyster sauce. Cocktail sauce. Horseradish. Ketchup and mustard. Meat flavorings and tenderizers. Bouillon cubes. Hot sauce. Tabasco sauce. Marinades. Taco seasonings. Relishes. Fats and Oils Butter, stick margarine, lard, shortening, ghee, and bacon fat. Coconut, palm kernel, or palm oils. Regular salad dressings. Other Pickles and olives. Salted popcorn and pretzels. The items listed above may not be a complete list of foods and beverages to avoid. Contact your dietitian for more information. WHERE CAN I FIND MORE INFORMATION? [employer], [employer], [employer]: travelstabl[employer] Document Released: 02/23/2011 Document Revised: 07/21/2013 Document Reviewed: 01/08/2013 [facility] Patient Information 2015 [place], [place]. This information is not intended to replace advice given to you by your health care provider. Make sure you discuss any questions you have with your health care provider.   Hypertension Hypertension, commonly called high blood pressure, is when the force of blood pumping through your arteries is too strong. Your arteries are the blood vessels that carry blood from your heart throughout your body. A blood pressure reading consists of a higher number over a lower number, such as 110/72. The higher number (systolic) is the pressure inside your arteries when your heart pumps. The lower number (diastolic) is the pressure inside your arteries when your heart relaxes. Ideally you want your blood pressure below 120/80. Hypertension forces your heart to work harder to pump blood. Your arteries may become narrow or stiff. Having hypertension puts you at risk for heart disease, stroke, and other problems.  RISK  FACTORS Some risk factors for high blood pressure are controllable. Others are not.  Risk factors you cannot control include:   Race. You may be at higher risk if you are African American.  Age. Risk increases with age.  Gender. Men are at higher risk than women before age 53 years. After age 53, women are at higher risk than men. Risk factors you can control include:  Not getting enough exercise or physical activity.  Being overweight.  Getting too much fat, sugar, calories, or salt in your diet.  Drinking too much alcohol. SIGNS AND SYMPTOMS Hypertension does not usually cause signs or symptoms. Extremely high blood pressure (hypertensive crisis) may cause headache, anxiety, shortness of breath, and nosebleed. DIAGNOSIS  To check if you have hypertension, your health care provider will measure your blood pressure while you are seated, with your arm held at the level of your heart. It should be measured at least twice using the same arm. Certain conditions can cause a difference in blood pressure between your right and left arms. A blood pressure reading that is higher than normal on one occasion does not mean that you need treatment. If one blood pressure reading is high, ask your health care provider about having it checked again. TREATMENT  Treating high blood pressure includes making lifestyle changes and possibly taking medicine. Living a healthy lifestyle can help lower high blood pressure. You may need to change some of your habits. Lifestyle changes may include:  Following the DASH diet. This diet is high in fruits, vegetables, and whole grains. It is low in salt, red meat, and added sugars.  Getting at least 2 hours of brisk physical activity every week.  Losing weight if necessary.  Not smoking.  Limiting alcoholic beverages.  Learning ways to reduce stress. If lifestyle changes are not enough to get your blood pressure under control, your health care provider may  prescribe medicine. You may need to take more than one. Work closely with your health care provider to understand the risks and benefits. HOME CARE INSTRUCTIONS  Have your blood pressure rechecked as directed by your health care provider.   Take medicines only as directed by your health care provider. Follow the directions carefully. Blood pressure medicines must be taken as prescribed. The medicine does not work as well when you skip doses. Skipping doses also puts you at risk for problems.   Do not smoke.   Monitor your blood pressure at home as directed by your health care provider. SEEK MEDICAL CARE IF:   You think you are having a reaction to medicines taken.  You have recurrent headaches or feel dizzy.  You have swelling in your ankles.  You have trouble with your vision. SEEK IMMEDIATE MEDICAL CARE IF:  You develop a severe headache or confusion.  You have unusual weakness, numbness, or feel faint.  You have severe chest or abdominal pain.  You vomit repeatedly.  You have trouble breathing. MAKE SURE YOU:   Understand these instructions.  Will watch your condition.  Will get help right away if you are not doing well or get worse. Document Released: 03/06/2005 Document Revised: 07/21/2013 Document Reviewed: 12/27/2012 [facility] Patient Information 2015 [place], [place]. This information is not intended to replace advice given to you by your health care provider. Make sure you discuss any questions you have with your health care provider. Cervical Strain and Sprain (Whiplash) with Rehab Cervical strain and sprain are injuries that commonly occur with "whiplash" injuries. Whiplash occurs when the neck is forcefully whipped backward or forward, such as during a motor vehicle accident or during contact sports. The muscles, ligaments, tendons, discs, and nerves of the neck are susceptible to injury when this occurs. RISK FACTORS Risk of having a whiplash injury increases  if:  Osteoarthritis of the spine.  Situations that make head or neck accidents or trauma more likely.  High-risk sports (football, rugby, wrestling, hockey, auto racing, gymnastics, diving, contact karate, or boxing).  Poor strength and flexibility of the neck.  Previous neck injury.  Poor tackling technique.  Improperly fitted or padded equipment. SYMPTOMS   Pain or stiffness in the front or back of neck or both.  Symptoms may present immediately or up to 24 hours after injury.  Dizziness, headache, nausea, and vomiting.  Muscle spasm with soreness and stiffness in the neck.  Tenderness and swelling at the injury site. PREVENTION  Learn and use proper technique (avoid tackling with the head, spearing, and head-butting; use proper falling techniques to avoid landing on the head).  Warm up and stretch properly before activity.  Maintain physical fitness:  Strength, flexibility, and endurance.  Cardiovascular fitness.  Wear properly fitted and padded protective equipment, such as padded soft collars, for participation in contact sports. PROGNOSIS  Recovery from cervical strain and sprain injuries is dependent on the extent of the injury. These injuries are usually curable in 1 week to 3 months with appropriate treatment.  RELATED COMPLICATIONS   Temporary numbness and weakness may occur if the nerve roots are damaged, and this may persist until the nerve has completely healed.  Chronic pain due to frequent recurrence of symptoms.  Prolonged healing, especially if activity is resumed too soon (before complete recovery). TREATMENT  Treatment  initially involves the use of ice and medication to help reduce pain and inflammation. It is also important to perform strengthening and stretching exercises and modify activities that worsen symptoms so the injury does not get worse. These exercises may be performed at home or with a therapist. For patients who experience severe  symptoms, a soft, padded collar may be recommended to be worn around the neck.  Improving your posture may help reduce symptoms. Posture improvement includes pulling your chin and abdomen in while sitting or standing. If you are sitting, sit in a firm chair with your buttocks against the back of the chair. While sleeping, try replacing your pillow with a small towel rolled to 2 inches in diameter, or use a cervical pillow or soft cervical collar. Poor sleeping positions delay healing.  For patients with nerve root damage, which causes numbness or weakness, the use of a cervical traction apparatus may be recommended. Surgery is rarely necessary for these injuries. However, cervical strain and sprains that are present at birth (congenital) may require surgery. MEDICATION   If pain medication is necessary, nonsteroidal anti-inflammatory medications, such as aspirin and ibuprofen, or other minor pain relievers, such as acetaminophen, are often recommended.  Do not take pain medication for 7 days before surgery.  Prescription pain relievers may be given if deemed necessary by your caregiver. Use only as directed and only as much as you need. HEAT AND COLD:   Cold treatment (icing) relieves pain and reduces inflammation. Cold treatment should be applied for 10 to 15 minutes every 2 to 3 hours for inflammation and pain and immediately after any activity that aggravates your symptoms. Use ice packs or an ice massage.  Heat treatment may be used prior to performing the stretching and strengthening activities prescribed by your caregiver, physical therapist, or athletic trainer. Use a heat pack or a warm soak. SEEK MEDICAL CARE IF:   Symptoms get worse or do not improve in 2 weeks despite treatment.  New, unexplained symptoms develop (drugs used in treatment may produce side effects). EXERCISES RANGE OF MOTION (ROM) AND STRETCHING EXERCISES - Cervical Strain and Sprain These exercises may help you when  beginning to rehabilitate your injury. In order to successfully resolve your symptoms, you must improve your posture. These exercises are designed to help reduce the forward-head and rounded-shoulder posture which contributes to this condition. Your symptoms may resolve with or without further involvement from your physician, physical therapist or athletic trainer. While completing these exercises, remember:   Restoring tissue flexibility helps normal motion to return to the joints. This allows healthier, less painful movement and activity.  An effective stretch should be held for at least 20 seconds, although you may need to begin with shorter hold times for comfort.  A stretch should never be painful. You should only feel a gentle lengthening or release in the stretched tissue. STRETCH- Axial Extensors  Lie on your back on the floor. You may bend your knees for comfort. Place a rolled-up hand towel or dish towel, about 2 inches in diameter, under the part of your head that makes contact with the floor.  Gently tuck your chin, as if trying to make a "double chin," until you feel a gentle stretch at the base of your head.  Hold __________ seconds. Repeat __________ times. Complete this exercise __________ times per day.  STRETCH - Axial Extension   Stand or sit on a firm surface. Assume a good posture: chest up, shoulders drawn back, abdominal muscles  slightly tense, knees unlocked (if standing) and feet hip width apart.  Slowly retract your chin so your head slides back and your chin slightly lowers. Continue to look straight ahead.  You should feel a gentle stretch in the back of your head. Be certain not to feel an aggressive stretch since this can cause headaches later.  Hold for __________ seconds. Repeat __________ times. Complete this exercise __________ times per day. STRETCH - Cervical Side Bend   Stand or sit on a firm surface. Assume a good posture: chest up, shoulders drawn  back, abdominal muscles slightly tense, knees unlocked (if standing) and feet hip width apart.  Without letting your nose or shoulders move, slowly tip your right / left ear to your shoulder until your feel a gentle stretch in the muscles on the opposite side of your neck.  Hold __________ seconds. Repeat __________ times. Complete this exercise __________ times per day. STRETCH - Cervical Rotators   Stand or sit on a firm surface. Assume a good posture: chest up, shoulders drawn back, abdominal muscles slightly tense, knees unlocked (if standing) and feet hip width apart.  Keeping your eyes level with the ground, slowly turn your head until you feel a gentle stretch along the back and opposite side of your neck.  Hold __________ seconds. Repeat __________ times. Complete this exercise __________ times per day. RANGE OF MOTION - Neck Circles   Stand or sit on a firm surface. Assume a good posture: chest up, shoulders drawn back, abdominal muscles slightly tense, knees unlocked (if standing) and feet hip width apart.  Gently roll your head down and around from the back of one shoulder to the back of the other. The motion should never be forced or painful.  Repeat the motion 10-20 times, or until you feel the neck muscles relax and loosen. Repeat __________ times. Complete the exercise __________ times per day. STRENGTHENING EXERCISES - Cervical Strain and Sprain These exercises may help you when beginning to rehabilitate your injury. They may resolve your symptoms with or without further involvement from your physician, physical therapist, or athletic trainer. While completing these exercises, remember:   Muscles can gain both the endurance and the strength needed for everyday activities through controlled exercises.  Complete these exercises as instructed by your physician, physical therapist, or athletic trainer. Progress the resistance and repetitions only as guided.  You may  experience muscle soreness or fatigue, but the pain or discomfort you are trying to eliminate should never worsen during these exercises. If this pain does worsen, stop and make certain you are following the directions exactly. If the pain is still present after adjustments, discontinue the exercise until you can discuss the trouble with your clinician. STRENGTH - Cervical Flexors, Isometric  Face a wall, standing about 6 inches away. Place a small pillow, a ball about 6-8 inches in diameter, or a folded towel between your forehead and the wall.  Slightly tuck your chin and gently push your forehead into the soft object. Push only with mild to moderate intensity, building up tension gradually. Keep your jaw and forehead relaxed.  Hold 10 to 20 seconds. Keep your breathing relaxed.  Release the tension slowly. Relax your neck muscles completely before you start the next repetition. Repeat __________ times. Complete this exercise __________ times per day. STRENGTH- Cervical Lateral Flexors, Isometric   Stand about 6 inches away from a wall. Place a small pillow, a ball about 6-8 inches in diameter, or a folded towel between the  side of your head and the wall.  Slightly tuck your chin and gently tilt your head into the soft object. Push only with mild to moderate intensity, building up tension gradually. Keep your jaw and forehead relaxed.  Hold 10 to 20 seconds. Keep your breathing relaxed.  Release the tension slowly. Relax your neck muscles completely before you start the next repetition. Repeat __________ times. Complete this exercise __________ times per day. STRENGTH - Cervical Extensors, Isometric   Stand about 6 inches away from a wall. Place a small pillow, a ball about 6-8 inches in diameter, or a folded towel between the back of your head and the wall.  Slightly tuck your chin and gently tilt your head back into the soft object. Push only with mild to moderate intensity, building up  tension gradually. Keep your jaw and forehead relaxed.  Hold 10 to 20 seconds. Keep your breathing relaxed.  Release the tension slowly. Relax your neck muscles completely before you start the next repetition. Repeat __________ times. Complete this exercise __________ times per day. POSTURE AND BODY MECHANICS CONSIDERATIONS - Cervical Strain and Sprain Keeping correct posture when sitting, standing or completing your activities will reduce the stress put on different body tissues, allowing injured tissues a chance to heal and limiting painful experiences. The following are general guidelines for improved posture. Your physician or physical therapist will provide you with any instructions specific to your needs. While reading these guidelines, remember:  The exercises prescribed by your provider will help you have the flexibility and strength to maintain correct postures.  The correct posture provides the optimal environment for your joints to work. All of your joints have less wear and tear when properly supported by a spine with good posture. This means you will experience a healthier, less painful body.  Correct posture must be practiced with all of your activities, especially prolonged sitting and standing. Correct posture is as important when doing repetitive low-stress activities (typing) as it is when doing a single heavy-load activity (lifting). PROLONGED STANDING WHILE SLIGHTLY LEANING FORWARD When completing a task that requires you to lean forward while standing in one place for a long time, place either foot up on a stationary 2- to 4-inch high object to help maintain the best posture. When both feet are on the ground, the low back tends to lose its slight inward curve. If this curve flattens (or becomes too large), then the back and your other joints will experience too much stress, fatigue more quickly, and can cause pain.  RESTING POSITIONS Consider which positions are most painful for  you when choosing a resting position. If you have pain with flexion-based activities (sitting, bending, stooping, squatting), choose a position that allows you to rest in a less flexed posture. You would want to avoid curling into a fetal position on your side. If your pain worsens with extension-based activities (prolonged standing, working overhead), avoid resting in an extended position such as sleeping on your stomach. Most people will find more comfort when they rest with their spine in a more neutral position, neither too rounded nor too arched. Lying on a non-sagging bed on your side with a pillow between your knees, or on your back with a pillow under your knees will often provide some relief. Keep in mind, being in any one position for a prolonged period of time, no matter how correct your posture, can still lead to stiffness. WALKING Walk with an upright posture. Your ears, shoulders, and hips should  all line up. OFFICE WORK When working at a desk, create an environment that supports good, upright posture. Without extra support, muscles fatigue and lead to excessive strain on joints and other tissues. CHAIR:  A chair should be able to slide under your desk when your back makes contact with the back of the chair. This allows you to work closely.  The chair's height should allow your eyes to be level with the upper part of your monitor and your hands to be slightly lower than your elbows.  Body position:  Your feet should make contact with the floor. If this is not possible, use a foot rest.  Keep your ears over your shoulders. This will reduce stress on your neck and low back. Document Released: 03/06/2005 Document Revised: 07/21/2013 Document Reviewed: 06/18/2008 [facility] Patient Information 2015 [place], [place]. This information is not intended to replace advice given to you by your health care provider. Make sure you discuss any questions you have with your health care provider.

## 2014-05-12 NOTE — Progress Notes (Addendum)
This chart was scribed for Delman Cheadle, MD by Lowella Petties, ED Scribe. The patient was seen in room 8. Patient's care was started at 10:45 AM.  Subjective:   Patient ID: Sean Valencia, male    DOB: 05-07-1960, 54 y.o.   MRN: [id number]  Chief Complaint  Patient presents with  . Neck Pain    Injured during snow/ 1 month ago, fell at [employer]    HPI HPI Comments: Sean Valencia is a 54 y.o. male who presents to the [facility] complaining of constant, aching, gradually worsening, left sided, neck pain after a fall in the ice and snow about 4 week ago. He states that he slipped and fell onto his side, and he denies bracing himself with his arms. He reports that the pain radiates into his upper left arm, but not past the elbow. He reports being able to use his left arm fully. He reports taking goody powders with moderate pain relief. He denies weakness, numbness, or headaches. He reports using Nasacort for allergies. He denies a history of HTN.  Past Medical History  Diagnosis Date  . Allergy    Current Outpatient Prescriptions on File Prior to Visit  Medication Sig Dispense Refill  . triamcinolone (NASACORT AQ) 55 MCG/ACT AERO nasal inhaler Place 2 sprays into the nose daily.     No current facility-administered medications on file prior to visit.   No Known Allergies  Review of Systems  Constitutional: Negative for fever and chills.  Musculoskeletal: Positive for neck pain.  Neurological: Negative for weakness and numbness.   Objective:  Physical Exam  Constitutional: He is oriented to person, place, and time. He appears well-developed and well-nourished. No distress.  HENT:  Head: Normocephalic and atraumatic.  Eyes: Conjunctivae and EOM are normal.  Neck: Neck supple. No tracheal deviation present.  Cardiovascular: Normal rate.   Pulmonary/Chest: Effort normal. No respiratory distress.  Musculoskeletal: Normal range of motion.  No tenderness over  C-spinous process. Severe spasm of the left cervical paraspinal muscle. Right paraspinal muscle normal. Negative Spurling's test. Moderately reduced flexion and lateral rotation, severely reduced extension and lateral flexion. Shoulder strength 5/5 bilaterally. Strength in hands 5/5 bilaterally.   Neurological: He is alert and oriented to person, place, and time. He has normal reflexes.  Reflex Scores:      Tricep reflexes are 2+ on the right side and 2+ on the left side.      Bicep reflexes are 2+ on the right side and 2+ on the left side.      Brachioradialis reflexes are 2+ on the right side and 2+ on the left side. Skin: Skin is warm and dry.  Psychiatric: He has a normal mood and affect. His behavior is normal.  Nursing note and vitals reviewed.  BP 160/98 mmHg  Pulse 63  Temp(Src) 98 F (36.7 C) (Oral)  Resp 16  Ht 5\' 10"  (1.778 m)  Wt 275 lb (124.739 kg)  BMI 39.46 kg/m2  SpO2 98%  [facility] reading (PRIMARY) by  Dr. Brigitte Pulse. C-spine: no acute bony abnormality. Some mild deg change c5-6, c6-7  Dg Cervical Spine 2 Or 3 Views  05/12/2014   CLINICAL DATA:  Subacute neck pain following fall 1 month ago. Initial encounter.  EXAM: CERVICAL SPINE - 2-3 VIEW  COMPARISON:  None.  FINDINGS: Normal alignment is noted.  There is no evidence of acute fracture, subluxation or prevertebral soft tissue swelling.  Mild to moderate degenerative disc disease  at C5-C6 is noted.  No focal bony abnormalities are noted.  IMPRESSION: No evidence of acute bony abnormality.  Mild to moderate degenerative disc disease at C5-C6.   Electronically Signed   By: Margarette Canada M.D.   On: 05/12/2014 12:34    Assessment & Plan:   Neck pain - Plan: DG Cervical Spine 2 or 3 views  Fall, initial encounter - Plan: DG Cervical Spine 2 or 3 views  Cervical strain, acute, sequela - Plan: Ambulatory referral to Physical Therapy  Cervical paraspinal muscle spasm - Plan: Ambulatory referral to Physical Therapy - worsening over 1  mo so try below w/ heat and gentle stretching.  Essential hypertension, benign - low salt dash diet, f/u in OV in 1 mo for recheck, careful with otc nsaids  Meds ordered this encounter  Medications  . loratadine-pseudoephedrine (CLARITIN-D 24-HOUR) 10-240 MG per 24 hr tablet    Sig: Take 1 tablet by mouth daily.  . cyclobenzaprine (FLEXERIL) 10 MG tablet    Sig: Take 1 tablet (10 mg total) by mouth 3 (three) times daily as needed for muscle spasms.    Dispense:  30 tablet    Refill:  1  . meloxicam (MOBIC) 7.5 MG tablet    Sig: Take 1 tablet (7.5 mg total) by mouth daily.    Dispense:  30 tablet    Refill:  0  . traMADol (ULTRAM) 50 MG tablet    Sig: Take 1 tablet (50 mg total) by mouth every 8 (eight) hours as needed.    Dispense:  30 tablet    Refill:  1    I personally performed the services described in this documentation, which was scribed in my presence. The recorded information has been reviewed and considered, and addended by me as needed.  Delman Cheadle, MD MPH

## 2015-02-07 ENCOUNTER — Encounter (HOSPITAL_COMMUNITY): Payer: Self-pay | Admitting: Emergency Medicine

## 2015-02-07 ENCOUNTER — Emergency Department (HOSPITAL_COMMUNITY): Payer: BC Managed Care – PPO

## 2015-02-07 ENCOUNTER — Emergency Department (HOSPITAL_COMMUNITY)
Admission: EM | Admit: 2015-02-07 | Discharge: 2015-02-08 | Disposition: A | Discharge status: Home / Self Care | Payer: BC Managed Care – PPO | Attending: Emergency Medicine | Admitting: Emergency Medicine

## 2015-02-07 DIAGNOSIS — M25562 Pain in left knee: Secondary | ICD-10-CM

## 2015-02-07 DIAGNOSIS — Y9289 Other specified places as the place of occurrence of the external cause: Secondary | ICD-10-CM | POA: Insufficient documentation

## 2015-02-07 DIAGNOSIS — Y9389 Activity, other specified: Secondary | ICD-10-CM | POA: Insufficient documentation

## 2015-02-07 DIAGNOSIS — S8992XA Unspecified injury of left lower leg, initial encounter: Secondary | ICD-10-CM | POA: Insufficient documentation

## 2015-02-07 DIAGNOSIS — Z79899 Other long term (current) drug therapy: Secondary | ICD-10-CM | POA: Insufficient documentation

## 2015-02-07 DIAGNOSIS — Z7951 Long term (current) use of inhaled steroids: Secondary | ICD-10-CM | POA: Diagnosis not present

## 2015-02-07 DIAGNOSIS — X58XXXA Exposure to other specified factors, initial encounter: Secondary | ICD-10-CM | POA: Diagnosis not present

## 2015-02-07 DIAGNOSIS — Y998 Other external cause status: Secondary | ICD-10-CM | POA: Diagnosis not present

## 2015-02-07 IMAGING — CR DG KNEE COMPLETE 4+V*L*
4 series · 4 of 4 positions shown · non-contrast
Comparison: None.

CLINICAL DATA: Patient missed a step with twisting injury to the
left knee at home this evening. Generalized left knee pain.

EXAM:
LEFT KNEE - COMPLETE 4+ VIEW

[knee ap]
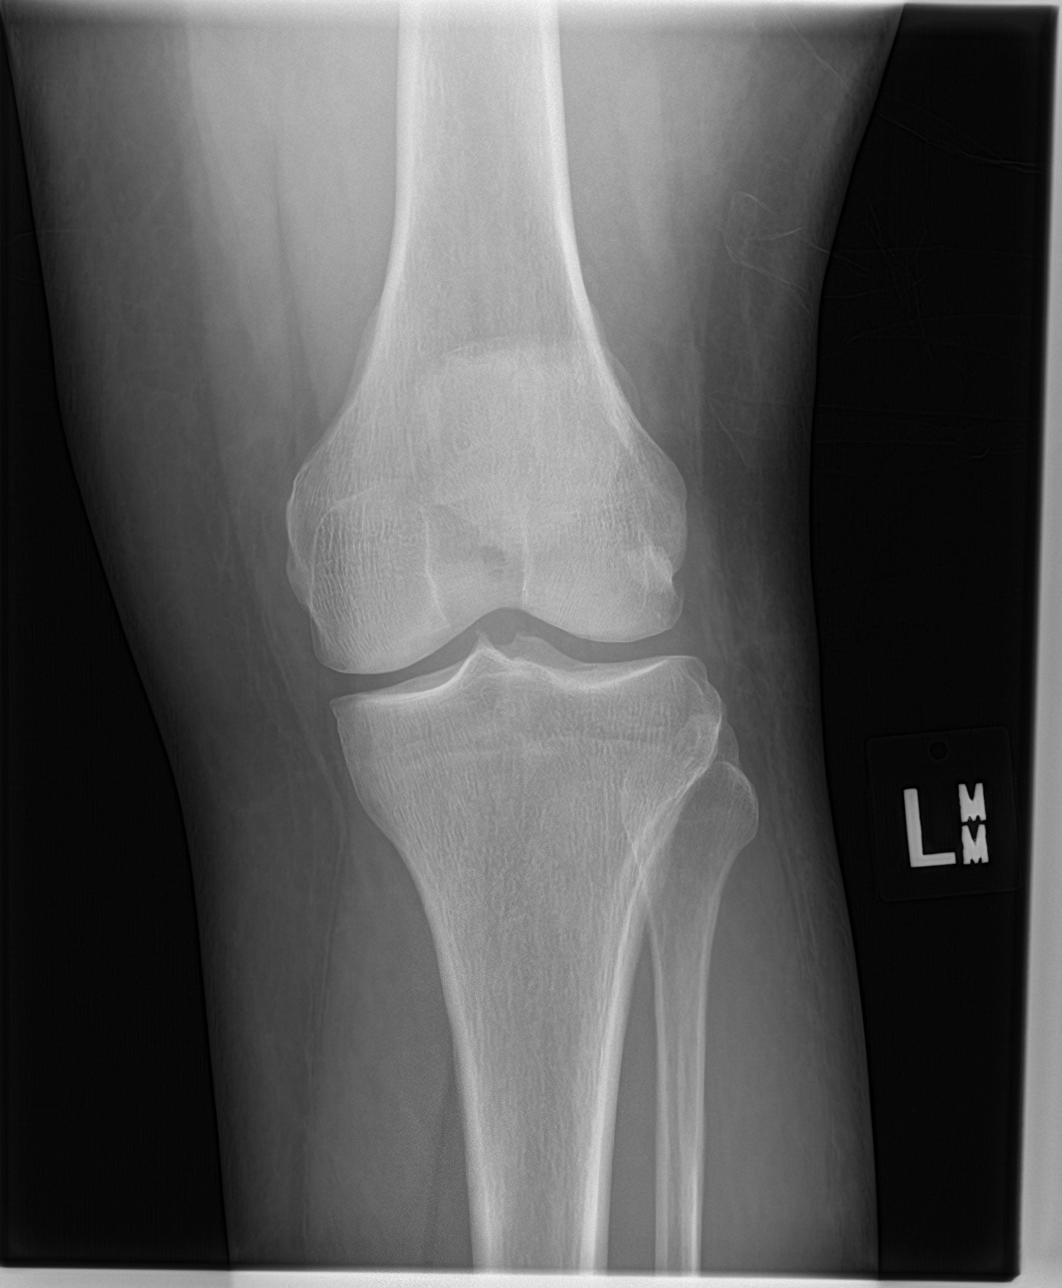

[tunnel]
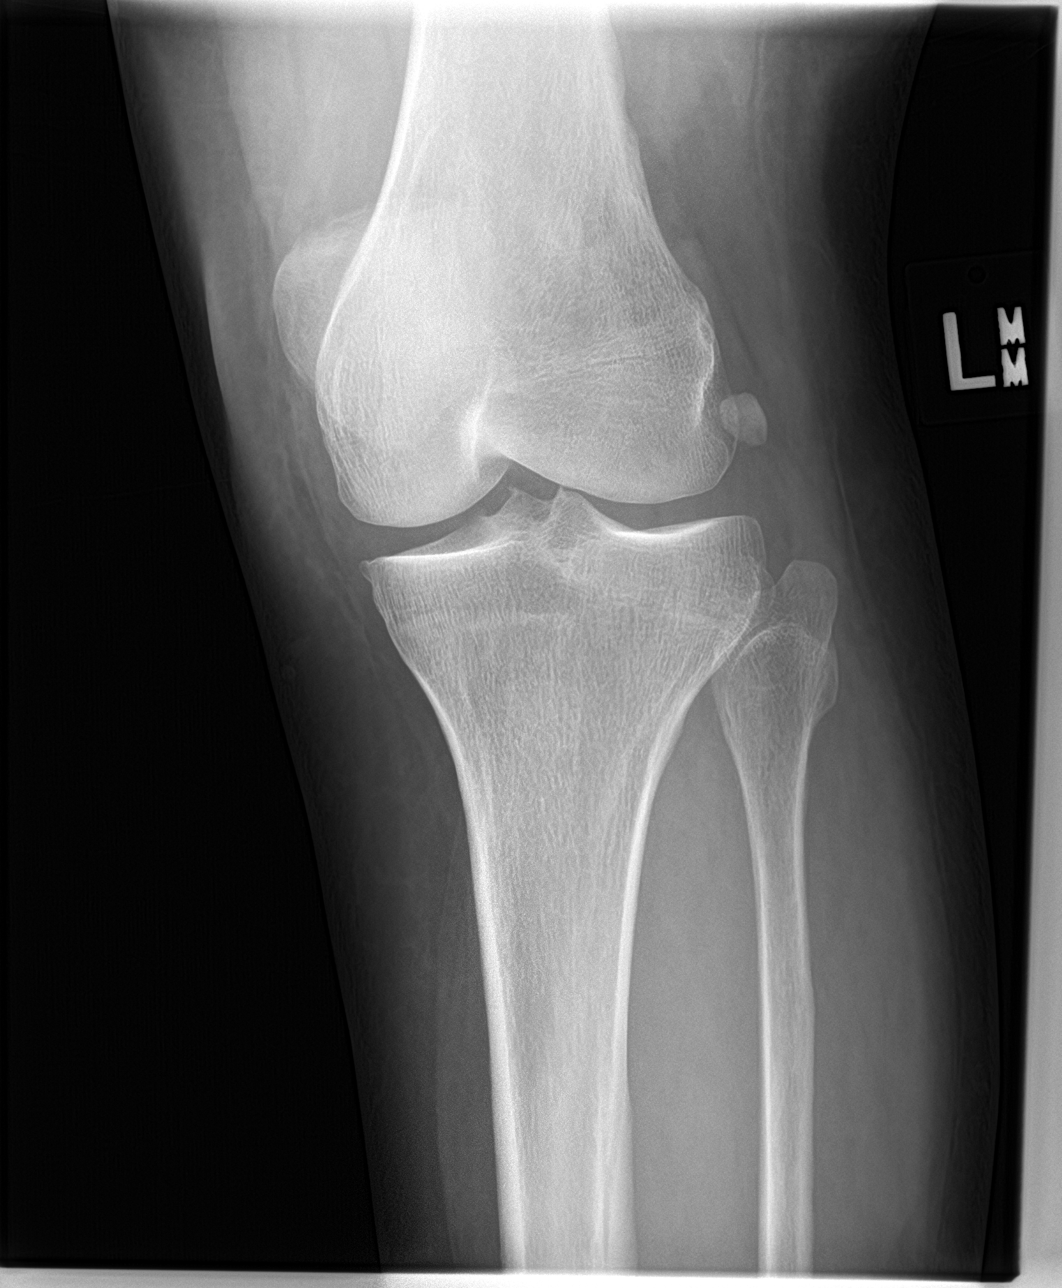

[knee lat]
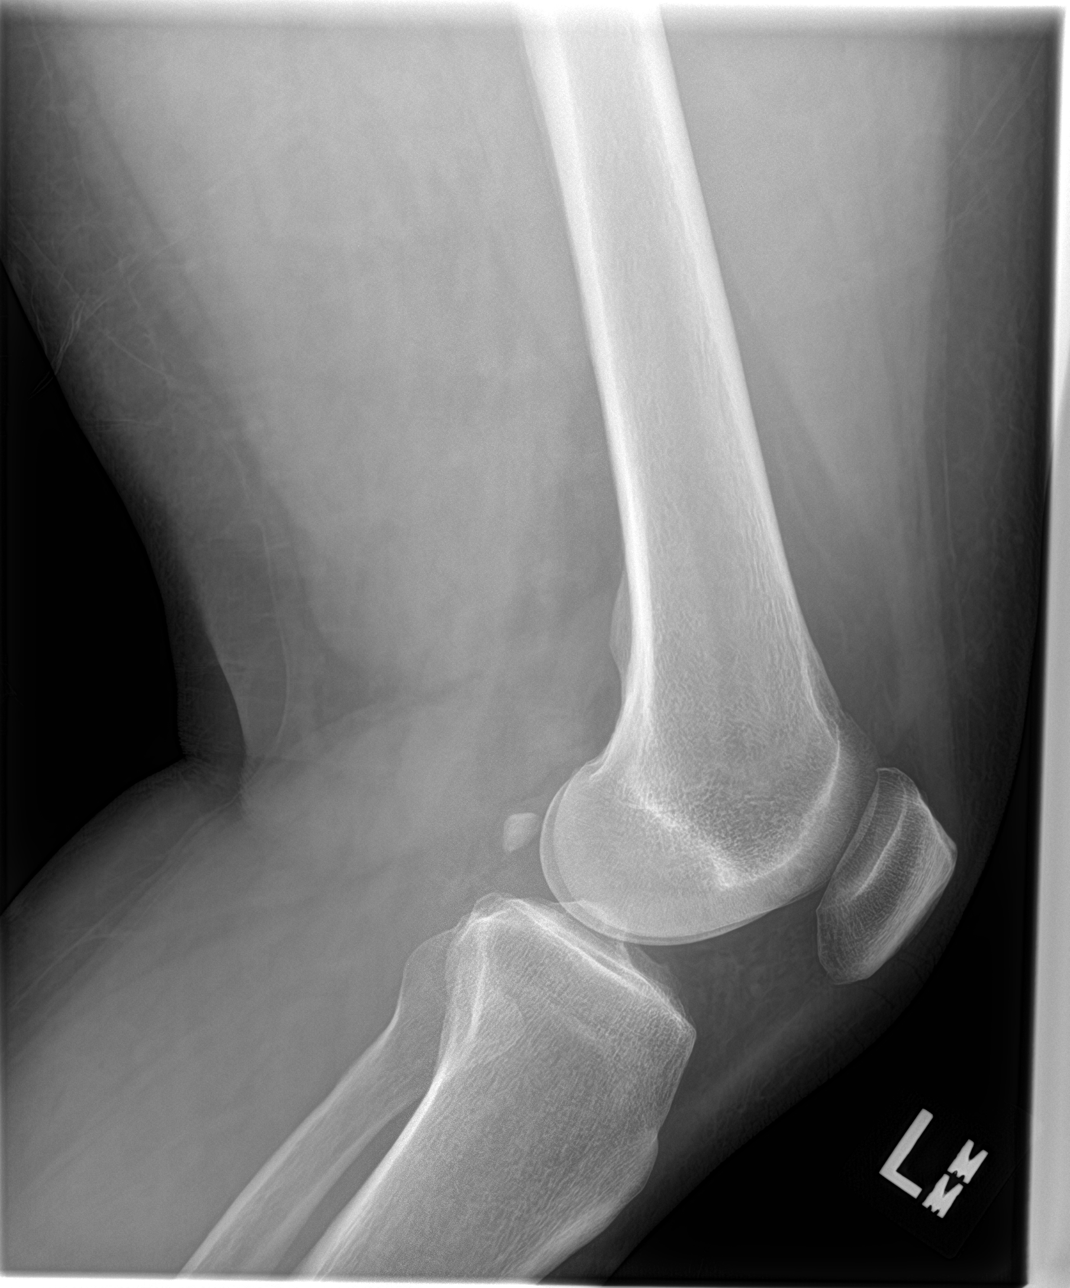

[knee obl]
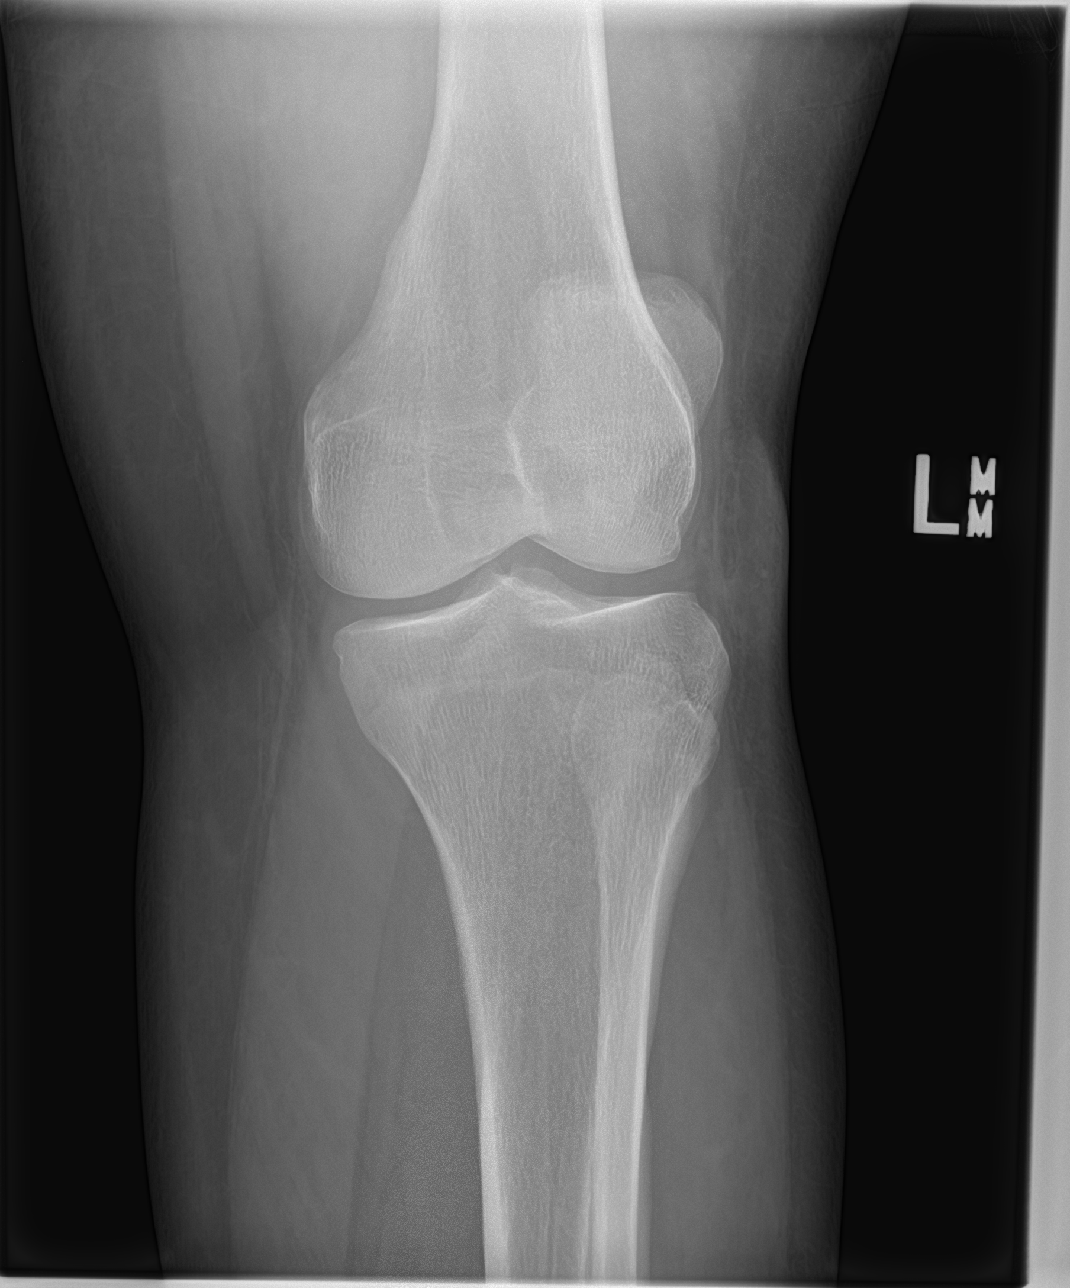

[4 of 4 positions shown; findings below may reference images not displayed]

FINDINGS: Mild medial compartment narrowing and osteophyte formation in the
left knee, likely early degenerative change. No significant
effusion. No evidence of acute fracture or subluxation. No focal
bone lesion or bone destruction. Bone cortex and trabecular
architecture appear intact. No radiopaque soft tissue foreign
bodies.
IMPRESSION: Mild degenerative changes in the medial compartment of the left
knee. No acute bony abnormalities.

## 2015-02-07 NOTE — ED Provider Notes (Signed)
CSN: [id number]     Arrival date & time 02/07/15  2308 History  By signing my name below, I, Hilda Lias, attest that this documentation has been prepared under the direction and in the presence of Rob Mentor, [place].  Electronically Signed: Hilda Lias, ED Scribe. 02/07/2015. 11:30 PM.  Chief Complaint  Patient presents with  . Knee Injury      The history is provided by the patient. No language interpreter was used.    HPI Comments: Sean Valencia is a 53 y.o. male who presents to the Emergency Department complaining of constant, worsening left knee pain that worsens with ambulation and movement of the knee that has been present since earlier this evening when pt missed a step and twisted his knee. Pt uses a cane to ambulate, and reports it is even more difficult to walk now that his knee hurts. Pt denies taking any medication to treat his pain prior to coming to ED.   Past Medical History  Diagnosis Date  . Allergy    Past Surgical History  Procedure Laterality Date  . Hernia repair     Family History  Problem Relation Age of Onset  . Diabetes Father    Social History  Substance Use Topics  . Smoking status: Never Smoker   . Smokeless tobacco: Never Used  . Alcohol Use: Yes    Review of Systems  Musculoskeletal: Positive for myalgias, arthralgias and gait problem.  All other systems reviewed and are negative.     Allergies  Review of patient's allergies indicates no known allergies.  Home Medications   Prior to Admission medications   Medication Sig Start Date End Date Taking? Authorizing Provider  cyclobenzaprine (FLEXERIL) 10 MG tablet Take 1 tablet (10 mg total) by mouth 3 (three) times daily as needed for muscle spasms. 05/12/14   Shawnee Knapp, MD  loratadine-pseudoephedrine (CLARITIN-D 24-HOUR) 10-240 MG per 24 hr tablet Take 1 tablet by mouth daily.    Historical Provider, MD  meloxicam (MOBIC) 7.5 MG tablet Take 1 tablet (7.5 mg total) by mouth daily.  05/12/14   Shawnee Knapp, MD  traMADol (ULTRAM) 50 MG tablet Take 1 tablet (50 mg total) by mouth every 8 (eight) hours as needed. 05/12/14   Shawnee Knapp, MD  triamcinolone (NASACORT AQ) 55 MCG/ACT AERO nasal inhaler Place 2 sprays into the nose daily.    Historical Provider, MD   BP 156/88 mmHg  Pulse 94  Temp(Src) 98.6 F (37 C) (Oral)  Resp 16  SpO2 98% Physical Exam  Constitutional: He is oriented to person, place, and time. He appears well-developed and well-nourished.  HENT:  Head: Normocephalic and atraumatic.  Cardiovascular: Normal rate.   Pulmonary/Chest: Effort normal.  Abdominal: He exhibits no distension.  Musculoskeletal: He exhibits no tenderness.  Left knee non-tender to palpation No bony abnormality or deformity ROM and strength mildly limited secondary to pain No evidence of septic joint or DVT  Neurological: He is alert and oriented to person, place, and time.  Skin: Skin is warm and dry.  Psychiatric: He has a normal mood and affect.  Nursing note and vitals reviewed.   ED Course  Procedures (including critical care time)  DIAGNOSTIC STUDIES: Oxygen Saturation is 98% on room air, normal by my interpretation.    COORDINATION OF CARE: 11:26 PM Discussed treatment plan with pt at bedside and pt agreed to plan.   Imaging Review Dg Knee Complete 4 Views Left  02/08/2015  CLINICAL DATA:  Patient missed a step with twisting injury to the left knee at home this evening. Generalized left knee pain. EXAM: LEFT KNEE - COMPLETE 4+ VIEW COMPARISON:  None. FINDINGS: Mild medial compartment narrowing and osteophyte formation in the left knee, likely early degenerative change. No significant effusion. No evidence of acute fracture or subluxation. No focal bone lesion or bone destruction. Bone cortex and trabecular architecture appear intact. No radiopaque soft tissue foreign bodies. IMPRESSION: Mild degenerative changes in the medial compartment of the left knee. No acute bony  abnormalities. Electronically Signed   By: Lucienne Capers M.D.   On: 02/08/2015 00:09   I have personally reviewed and evaluated these images and lab results as part of my medical decision-making.   MDM   Final diagnoses:  Left knee pain    Patient with left knee pain.  Plain films negative for fracture.  Will treat for sprain.  Will give knee sleeve and recommend ortho follow-up.  I personally performed the services described in this documentation, which was scribed in my presence. The recorded information has been reviewed and is accurate.      Montine Circle, PA-C 02/08/15 [id number]  Merryl Hacker, MD 02/08/15 [phone]

## 2015-02-07 NOTE — ED Notes (Signed)
Pt. mssed his step and injured his left knee at home this evening , reports left knee pain .

## 2015-02-08 MED ORDER — HYDROCODONE-ACETAMINOPHEN 5-325 MG PO TABS: 1.0000 | ORAL_TABLET | Freq: Four times a day (QID) | ORAL | Status: DC | PRN

## 2015-02-08 NOTE — Discharge Instructions (Signed)

## 2015-10-19 ENCOUNTER — Other Ambulatory Visit: Payer: Self-pay | Admitting: General Surgery

## 2015-10-19 NOTE — H&P (Signed)
History of Present Illness Sean Ruff MD; [id number] 10:40 AM) The patient is a 55 year old male who presents with anal fistula. 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 It has been seen by several different doctors and opened and drained in the past. He continues to persist. Patient states he has regular bowel habits and denies any major medical problems. He has never had any anorectal surgery before.   Other Problems Marjean Donna, CMA; 10/19/2015 10:19 AM) Inguinal Hernia  Past Surgical History Davy Pique Bynum, CMA; 10/19/2015 10:19 AM) Open Inguinal Hernia Surgery Bilateral.  Diagnostic Studies History Marjean Donna, CMA; 10/19/2015 10:19 AM) Colonoscopy never  Allergies (Sonya Bynum, CMA; 10/19/2015 10:20 AM) No Known Drug Allergies 10/19/2015  Medication History (Sonya Bynum, CMA; 10/19/2015 10:20 AM) Naproxen DR (500MG  Tablet DR, Oral) Active. Medications Reconciled  Social History ([place]; 10/19/2015 10:19 AM) Alcohol use Occasional alcohol use. Caffeine use Coffee, Tea. No drug use Tobacco use Never smoker.  Family History Marjean Donna, CMA; 10/19/2015 10:19 AM) Diabetes Mellitus Father.     Review of Systems Davy Pique Bynum CMA; 10/19/2015 10:19 AM) General Not Present- Appetite Loss, Chills, Fatigue, Fever, Night Sweats, Weight Gain and Weight Loss. Skin Present- Dryness. Not Present- Change in Wart/Mole, Hives, Jaundice, New Lesions, Non-Healing Wounds, Rash and Ulcer. HEENT Present- Sinus Pain and Wears glasses/contact lenses. Not Present- Earache, Hearing Loss, Hoarseness, Nose Bleed, Oral Ulcers, Ringing in the Ears, Seasonal Allergies, Sore Throat, Visual Disturbances and Yellow Eyes. Respiratory Not Present- Bloody sputum, Chronic Cough, Difficulty Breathing, Snoring and Wheezing. Breast Not Present- Breast Mass, Breast Pain, Nipple Discharge and Skin Changes. Cardiovascular Present- Swelling of  Extremities. Not Present- Chest Pain, Difficulty Breathing Lying Down, Leg Cramps, Palpitations, Rapid Heart Rate and Shortness of Breath. Gastrointestinal Not Present- Abdominal Pain, Bloating, Bloody Stool, Change in Bowel Habits, Chronic diarrhea, Constipation, Difficulty Swallowing, Excessive gas, Gets full quickly at meals, Hemorrhoids, Indigestion, Nausea, Rectal Pain and Vomiting. Male Genitourinary Present- Nocturia. Not Present- Blood in Urine, Change in Urinary Stream, Frequency, Impotence, Painful Urination, Urgency and Urine Leakage. Musculoskeletal Present- Back Pain. Not Present- Joint Pain, Joint Stiffness, Muscle Pain, Muscle Weakness and Swelling of Extremities. Neurological Not Present- Decreased Memory, Fainting, Headaches, Numbness, Seizures, Tingling, Tremor, Trouble walking and Weakness. Psychiatric Not Present- Anxiety, Bipolar, Change in Sleep Pattern, Depression, Fearful and Frequent crying. Endocrine Not Present- Cold Intolerance, Excessive Hunger, Hair Changes, Heat Intolerance, Hot flashes and New Diabetes. Hematology Not Present- Blood Thinners, Easy Bruising, Excessive bleeding, Gland problems, HIV and Persistent Infections.  Vitals (Sonya Bynum CMA; 10/19/2015 10:20 AM) 10/19/2015 10:19 AM Weight: 248 lb Height: 71in Body Surface Area: 2.31 m Body Mass Index: 34.59 kg/m  Temp.: 38F(Temporal)  Pulse: 79 (Regular)  BP: 128/84 (Sitting, Left Arm, Standard)      Physical Exam Sean Ruff MD; [id number] 10:51 AM)  General Mental Status-Alert. General Appearance-Not in acute distress. Build & Nutrition-Well nourished. Posture-Normal posture. Gait-Normal.  Head and Neck Head-normocephalic, atraumatic with no lesions or palpable masses. Trachea-midline.  Chest and Lung Exam Chest and lung exam reveals -on auscultation, normal breath sounds, no adventitious sounds and normal vocal resonance.  Cardiovascular Cardiovascular  examination reveals -normal heart sounds, regular rate and rhythm with no murmurs.  Abdomen Inspection Inspection of the abdomen reveals - No Hernias. Palpation/Percussion Palpation and Percussion of the abdomen reveal - Soft, Non Tender, No Rigidity (guarding), No hepatosplenomegaly and No Palpable abdominal masses.  Rectal Anorectal Exam External - Note: Left posterior lateral  external opening draining purulence.  Neurologic Neurologic evaluation reveals -alert and oriented x 3 with no impairment of recent or remote memory, normal attention span and ability to concentrate, normal sensation and normal coordination.  Musculoskeletal Normal Exam - Bilateral-Upper Extremity Strength Normal and Lower Extremity Strength Normal.    Assessment & Plan Sean Ruff MD; [id number] 10:50 AM)  ANAL FISTULA (K60.3) Impression: 55 year old male with a 3 year history of a chronically draining perianal wound. This is most consistent with anal fistula on physical exam. I have recommended anal exam under anesthesia with possible fistulotomy versus seton placement. We have discussed this in detail. Risks of surgery include bleeding, prolonged healing, recurrence and incontinence.

## 2015-12-10 ENCOUNTER — Encounter: Payer: Self-pay | Admitting: Physician Assistant

## 2016-10-16 ENCOUNTER — Other Ambulatory Visit: Payer: Self-pay | Admitting: General Surgery

## 2016-10-16 NOTE — H&P (Signed)
History of Present Illness Leighton Ruff MD; 1/88/4166 12:36 PM) The patient is a 56 year old male who presents with anal fistula. 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 It has been seen by several different doctors and opened and drained in the past. He continues to have persistent pain and drainage. Patient states he has regular bowel habits and denies any major medical problems. He has never had any anorectal surgery before.   Problem List/Past Medical Leighton Ruff, MD; 0/63/0160 12:36 PM) ANAL FISTULA (K60.3)  Past Surgical History Leighton Ruff, MD; 03/28/3233 12:36 PM) Open Inguinal Hernia Surgery Bilateral.  Diagnostic Studies History Leighton Ruff, MD; 5/73/2202 12:36 PM) Colonoscopy never  Allergies Mammie Lorenzo, LPN; 5/42/7062 37:62 PM) No Known Drug Allergies 10/19/2015  Medication History Mammie Lorenzo, LPN; 11/18/5174 16:07 PM) Nasonex (50MCG/ACT Suspension, Nasal) Active. Naproxen DR (500MG  Tablet DR, Oral) Active. Medications Reconciled  Social History Leighton Ruff, MD; 3/71/0626 12:36 PM) Alcohol use Occasional alcohol use. Caffeine use Coffee, Tea. No drug use Tobacco use Never smoker.  Family History Leighton Ruff, MD; 9/48/5462 12:36 PM) Diabetes Mellitus Father.  Other Problems Leighton Ruff, MD; 09/20/5007 12:36 PM) Inguinal Hernia     Review of Systems Leighton Ruff MD; 3/81/8299 12:36 PM) General Not Present- Appetite Loss, Chills, Fatigue, Fever, Night Sweats, Weight Gain and Weight Loss. Skin Present- Dryness. Not Present- Change in Wart/Mole, Hives, Jaundice, New Lesions, Non-Healing Wounds, Rash and Ulcer. HEENT Present- Sinus Pain and Wears glasses/contact lenses. Not Present- Earache, Hearing Loss, Hoarseness, Nose Bleed, Oral Ulcers, Ringing in the Ears, Seasonal Allergies, Sore Throat, Visual Disturbances and Yellow Eyes. Respiratory Not Present- Bloody sputum,  Chronic Cough, Difficulty Breathing, Snoring and Wheezing. Breast Not Present- Breast Mass, Breast Pain, Nipple Discharge and Skin Changes. Cardiovascular Present- Swelling of Extremities. Not Present- Chest Pain, Difficulty Breathing Lying Down, Leg Cramps, Palpitations, Rapid Heart Rate and Shortness of Breath. Gastrointestinal Not Present- Abdominal Pain, Bloating, Bloody Stool, Change in Bowel Habits, Chronic diarrhea, Constipation, Difficulty Swallowing, Excessive gas, Gets full quickly at meals, Hemorrhoids, Indigestion, Nausea, Rectal Pain and Vomiting. Male Genitourinary Present- Nocturia. Not Present- Blood in Urine, Change in Urinary Stream, Frequency, Impotence, Painful Urination, Urgency and Urine Leakage. Musculoskeletal Present- Back Pain. Not Present- Joint Pain, Joint Stiffness, Muscle Pain, Muscle Weakness and Swelling of Extremities. Neurological Not Present- Decreased Memory, Fainting, Headaches, Numbness, Seizures, Tingling, Tremor, Trouble walking and Weakness. Psychiatric Not Present- Anxiety, Bipolar, Change in Sleep Pattern, Depression, Fearful and Frequent crying. Endocrine Not Present- Cold Intolerance, Excessive Hunger, Hair Changes, Heat Intolerance and New Diabetes. Hematology Not Present- Blood Thinners, Easy Bruising, Excessive bleeding, Gland problems, HIV and Persistent Infections.  Vitals Claiborne Billings Dockery LPN; 3/71/6967 89:38 PM) 10/16/2016 12:25 PM Weight: 242.4 lb Height: 71in Body Surface Area: 2.29 m Body Mass Index: 33.81 kg/m  Temp.: 98.87F(Oral)  Pulse: 89 (Regular)  BP: 142/90 (Sitting, Left Arm, Standard)      Physical Exam Leighton Ruff MD; 03/20/7508 12:36 PM)  General Mental Status-Alert. General Appearance-Not in acute distress. Build & Nutrition-Well nourished. Posture-Normal posture. Gait-Normal.  Head and Neck Head-normocephalic, atraumatic with no lesions or palpable masses. Trachea-midline.  Chest and  Lung Exam Chest and lung exam reveals -on auscultation, normal breath sounds, no adventitious sounds and normal vocal resonance.  Cardiovascular Cardiovascular examination reveals -normal heart sounds, regular rate and rhythm with no murmurs.  Abdomen Inspection Inspection of the abdomen reveals - No Hernias. Palpation/Percussion Palpation and Percussion of the abdomen reveal - Soft, Non Tender,  No Rigidity (guarding), No hepatosplenomegaly and No Palpable abdominal masses.  Rectal Anorectal Exam External - Note: Left posterior lateral external opening draining purulence.  Neurologic Neurologic evaluation reveals -alert and oriented x 3 with no impairment of recent or remote memory, normal attention span and ability to concentrate, normal sensation and normal coordination.  Musculoskeletal Normal Exam - Bilateral-Upper Extremity Strength Normal and Lower Extremity Strength Normal.    Assessment & Plan Leighton Ruff MD; 4/94/4967 12:35 PM)  ANAL FISTULA (K60.3) Impression: 56 year old male with a 3 year history of a chronically draining perianal wound. This is most consistent with anal fistula on physical exam. I have recommended anal exam under anesthesia with possible fistulotomy versus seton placement. We have discussed this in detail. Risks of surgery include bleeding, prolonged healing, recurrence and incontinence.

## 2016-11-16 ENCOUNTER — Encounter (HOSPITAL_BASED_OUTPATIENT_CLINIC_OR_DEPARTMENT_OTHER): Payer: Self-pay | Admitting: *Deleted

## 2016-11-23 ENCOUNTER — Ambulatory Visit (HOSPITAL_BASED_OUTPATIENT_CLINIC_OR_DEPARTMENT_OTHER): Admission: RE | Admit: 2016-11-23 | Payer: BC Managed Care – PPO | Source: Ambulatory Visit | Admitting: General Surgery

## 2016-11-23 ENCOUNTER — Encounter (HOSPITAL_BASED_OUTPATIENT_CLINIC_OR_DEPARTMENT_OTHER): Admission: RE | Payer: Self-pay | Source: Ambulatory Visit

## 2016-11-23 HISTORY — DX: Anal fistula, unspecified: K60.30

## 2016-11-23 HISTORY — DX: Anal fistula: K60.3

## 2016-11-23 HISTORY — DX: Personal history of urinary calculi: Z87.442

## 2016-11-23 SURGERY — EXAM UNDER ANESTHESIA WITH ANAL FISSUROTOMY
Anesthesia: Monitor Anesthesia Care

## 2020-01-30 ENCOUNTER — Ambulatory Visit: Payer: Self-pay

## 2020-01-30 ENCOUNTER — Ambulatory Visit: Payer: BC Managed Care – PPO | Attending: Internal Medicine

## 2020-01-30 DIAGNOSIS — Z23 Encounter for immunization: Secondary | ICD-10-CM

## 2020-01-30 NOTE — Progress Notes (Signed)
   Covid-19 Vaccination [facility]  Name:  RYATT CORSINO    MRN: [id number] DOB: June 07, 1960  01/30/2020  Mr. Batts was observed post Covid-19 immunization for 15 minutes without incident. He was provided with Vaccine Information Sheet and instruction to access the V-Safe system.   Mr. Basher was instructed to call [phone] with any severe reactions post vaccine: Marland Kitchen Difficulty breathing  . Swelling of face and throat  . A fast heartbeat  . A bad rash all over body  . Dizziness and weakness

## 2020-05-14 ENCOUNTER — Ambulatory Visit: Payer: BC Managed Care – PPO | Admitting: Gastroenterology

## 2020-05-17 ENCOUNTER — Encounter: Payer: Self-pay | Admitting: Gastroenterology

## 2020-05-17 ENCOUNTER — Ambulatory Visit: Payer: BC Managed Care – PPO | Admitting: Gastroenterology

## 2020-05-17 VITALS — BP 152/82 | HR 80 | Ht 69.25 in | Wt 257.5 lb

## 2020-05-17 DIAGNOSIS — Z8719 Personal history of other diseases of the digestive system: Secondary | ICD-10-CM

## 2020-05-17 DIAGNOSIS — R1032 Left lower quadrant pain: Secondary | ICD-10-CM

## 2020-05-17 MED ORDER — PLENVU 140 G PO SOLR: ORAL | 0 refills | Status: DC

## 2020-05-17 NOTE — Progress Notes (Signed)
ISSAIH KAUS    [id number]    July 11, 1960  Primary Care Physician:Patient, No Pcp Per  Referring Physician: Everardo Beals, NP [place],  [place] [address]   Chief complaint: Left upper quadrant abdominal pain  HPI:  60 year old very pleasant gentleman here for evaluation of left-sided abdominal pain.  He went to urgent care last month with severe left-sided abdominal pain, was prescribed antibiotics which improved his pain.  Denies any recent change in bowel habits.  He has occasional constipation and diarrhea.  Denies any rectal bleeding or melena.  No weight loss or decreased appetite  He he has history of anal fistula, was planning to undergo fistulotomy but canceled it due to Covid.  He continues to have intermittent discharge per rectum but is not as bad as it was before.  No family history of IBD or colon cancer.  He never had colonoscopy.    Outpatient Encounter Medications as of 05/17/2020  Medication Sig  . loratadine-pseudoephedrine (CLARITIN-D 24-HOUR) 10-240 MG per 24 hr tablet Take 1 tablet by mouth daily.  Marland Kitchen triamcinolone (NASACORT AQ) 55 MCG/ACT AERO nasal inhaler Place 2 sprays into the nose daily.   No facility-administered encounter medications on file as of 05/17/2020.    Allergies as of 05/17/2020  . (No Known Allergies)    Past Medical History:  Diagnosis Date  . Anal fistula   . History of kidney stones     Past Surgical History:  Procedure Laterality Date  . HERNIA REPAIR      Family History  Problem Relation Age of Onset  . Diabetes Father     Social History   Socioeconomic History  . Marital status: Married    Spouse name: Not on file  . Number of children: Not on file  . Years of education: Not on file  . Highest education level: Not on file  Occupational History  . Not on file  Tobacco Use  . Smoking status: Never Smoker  . Smokeless tobacco: Never Used  Substance and Sexual Activity  .  Alcohol use: Yes  . Drug use: No  . Sexual activity: Yes    Birth control/protection: None  Other Topics Concern  . Not on file  Social History Narrative  . Not on file   Social Determinants of Health   Financial Resource Strain: Not on file  Food Insecurity: Not on file  Transportation Needs: Not on file  Physical Activity: Not on file  Stress: Not on file  Social Connections: Not on file  Intimate Partner Violence: Not on file      Review of systems: All other review of systems negative except as mentioned in the HPI.   Physical Exam: Vitals:   05/17/20 1455  BP: (!) 152/82  Pulse: 80   Body mass index is 37.75 kg/m. Gen:      No acute distress HEENT:  sclera anicteric Abd:      soft, non-tender; no palpable masses, no distension Ext:    No edema Neuro: alert and oriented x 3 Psych: normal mood and affect  Data Reviewed:  Reviewed labs, radiology imaging, old records and pertinent past GI work up   Assessment and Plan/Recommendations: 60 year old very pleasant gentleman here for evaluation of left-sided abdominal pain, recent visit to urgent care 4 weeks ago and was treated empirically with antibiotics for acute diverticulitis, with improvement of symptoms History of anal fistula  We will schedule for colonoscopy  for further evaluation and exclude any neoplastic lesion or IBD The risks and benefits as well as alternatives of endoscopic procedure(s) have been discussed and reviewed. All questions answered. The patient agrees to proceed.  Continue with high-fiber diet and increase water intake  Further recommendations based on findings on colonoscopy  The patient was provided an opportunity to ask questions and all were answered. The patient agreed with the plan and demonstrated an understanding of the instructions.  Damaris Hippo , MD    CC: Everardo Beals, NP

## 2020-05-17 NOTE — Patient Instructions (Signed)
You have been scheduled for a colonoscopy. Please follow written instructions given to you at your visit today.  Please pick up your prep supplies at the pharmacy within the next 1-3 days. If you use inhalers (even only as needed), please bring them with you on the day of your procedure.   Due to recent changes in healthcare laws, you may see the results of your imaging and laboratory studies on MyChart before your provider has had a chance to review them.  We understand that in some cases there may be results that are confusing or concerning to you. Not all laboratory results come back in the same time frame and the provider may be waiting for multiple results in order to interpret others.  Please give [place] 48 hours in order for your provider to thoroughly review all the results before contacting the office for clarification of your results.   Call the office at [phone] with any recurrent episodes of severe abdominal pain   If you are age 40 or older, your body mass index should be between 23-30. Your Body mass index is 37.75 kg/m. If this is out of the aforementioned range listed, please consider follow up with your Primary Care Provider.  If you are age 70 or younger, your body mass index should be between 19-25. Your Body mass index is 37.75 kg/m. If this is out of the aformentioned range listed, please consider follow up with your Primary Care Provider.    I appreciate the  opportunity to care for you  Thank You   Harl Bowie , MD

## 2020-07-19 ENCOUNTER — Encounter: Payer: Self-pay | Admitting: Gastroenterology

## 2020-07-19 ENCOUNTER — Ambulatory Visit (AMBULATORY_SURGERY_CENTER): Payer: BC Managed Care – PPO | Admitting: Gastroenterology

## 2020-07-19 ENCOUNTER — Other Ambulatory Visit: Payer: Self-pay

## 2020-07-19 VITALS — BP 152/87 | HR 67 | Temp 97.8°F | Resp 16 | Ht 69.25 in | Wt 257.0 lb

## 2020-07-19 DIAGNOSIS — D129 Benign neoplasm of anus and anal canal: Secondary | ICD-10-CM | POA: Diagnosis not present

## 2020-07-19 DIAGNOSIS — K5730 Diverticulosis of large intestine without perforation or abscess without bleeding: Secondary | ICD-10-CM

## 2020-07-19 DIAGNOSIS — D128 Benign neoplasm of rectum: Secondary | ICD-10-CM | POA: Diagnosis not present

## 2020-07-19 DIAGNOSIS — K648 Other hemorrhoids: Secondary | ICD-10-CM | POA: Diagnosis not present

## 2020-07-19 DIAGNOSIS — D123 Benign neoplasm of transverse colon: Secondary | ICD-10-CM

## 2020-07-19 DIAGNOSIS — R1032 Left lower quadrant pain: Secondary | ICD-10-CM

## 2020-07-19 MED ORDER — SODIUM CHLORIDE 0.9 % IV SOLN: 500.0000 mL | Freq: Once | INTRAVENOUS | Status: DC

## 2020-07-19 NOTE — Patient Instructions (Signed)
YOU HAD AN ENDOSCOPIC PROCEDURE TODAY AT [facility]:   Refer to the procedure report that was given to you for any specific questions about what was found during the examination.  If the procedure report does not answer your questions, please call your gastroenterologist to clarify.  If you requested that your care partner not be given the details of your procedure findings, then the procedure report has been included in a sealed envelope for you to review at your convenience later.  YOU SHOULD EXPECT: Some feelings of bloating in the abdomen. Passage of more gas than usual.  Walking can help get rid of the air that was put into your GI tract during the procedure and reduce the bloating. If you had a lower endoscopy (such as a colonoscopy or flexible sigmoidoscopy) you may notice spotting of blood in your stool or on the toilet paper. If you underwent a bowel prep for your procedure, you may not have a normal bowel movement for a few days.  Please Note:  You might notice some irritation and congestion in your nose or some drainage.  This is from the oxygen used during your procedure.  There is no need for concern and it should clear up in a day or so.  SYMPTOMS TO REPORT IMMEDIATELY:   Following lower endoscopy (colonoscopy or flexible sigmoidoscopy):  Excessive amounts of blood in the stool  Significant tenderness or worsening of abdominal pains  Swelling of the abdomen that is new, acute  Fever of 100F or higher   For urgent or emergent issues, a gastroenterologist can be reached at any hour by calling [phone]. Do not use MyChart messaging for urgent concerns.    DIET:  We do recommend a small meal at first, but then you may proceed to your regular diet.  Drink plenty of fluids but you should avoid alcoholic beverages for 24 hours.  MEDICATIONS: Continue present medications. Use Benefiber one teaspoon by mouth three times daily (this is over-the-counter). No Aspirin,  Ibuprofen, Naproxen, or other non-steroidal anti-inflammatory drugs for 2 weeks.  Please see handouts given to you by your recovery nurse.  Thank you for allowing [place] to provide for your healthcare needs today.  ACTIVITY:  You should plan to take it easy for the rest of today and you should NOT DRIVE or use heavy machinery until tomorrow (because of the sedation medicines used during the test).    FOLLOW UP: Our staff will call the number listed on your records 48-72 hours following your procedure to check on you and address any questions or concerns that you may have regarding the information given to you following your procedure. If we do not reach you, we will leave a message.  We will attempt to reach you two times.  During this call, we will ask if you have developed any symptoms of COVID 19. If you develop any symptoms (ie: fever, flu-like symptoms, shortness of breath, cough etc.) before then, please call [phone].  If you test positive for Covid 19 in the 2 weeks post procedure, please call and report this information to [place].    If any biopsies were taken you will be contacted by phone or by letter within the next 1-3 weeks.  Please call us at [phone] if you have not heard about the biopsies in 3 weeks.    SIGNATURES/CONFIDENTIALITY: You and/or your care partner have signed paperwork which will be entered into your electronic medical record.  These signatures attest  to the fact that that the information above on your After Visit Summary has been reviewed and is understood.  Full responsibility of the confidentiality of this discharge information lies with you and/or your care-partner. 

## 2020-07-19 NOTE — Progress Notes (Signed)
Report given to PACU, vss 

## 2020-07-19 NOTE — Progress Notes (Signed)
Called to room to assist during endoscopic procedure.  Patient ID and intended procedure confirmed with present staff. Received instructions for my participation in the procedure from the performing physician.  

## 2020-07-19 NOTE — Op Note (Signed)
[place] Patient Name: Sean Valencia Procedure Date: 07/19/2020 10:33 AM MRN: [id number] Endoscopist: Mauri Pole , MD Age: 60 Referring MD:  Date of Birth: September 20, 1960 Gender: Male Account #: [id number] Procedure:                Colonoscopy Indications:              Abdominal pain in the left lower quadrant,                            Diverticulitis, Follow-up of diverticulitis Medicines:                Monitored Anesthesia Care Procedure:                Pre-Anesthesia Assessment:                           - Prior to the procedure, a History and Physical                            was performed, and patient medications and                            allergies were reviewed. The patient's tolerance of                            previous anesthesia was also reviewed. The risks                            and benefits of the procedure and the sedation                            options and risks were discussed with the patient.                            All questions were answered, and informed consent                            was obtained. Prior Anticoagulants: The patient has                            taken no previous anticoagulant or antiplatelet                            agents. ASA Grade Assessment: II - A patient with                            mild systemic disease. After reviewing the risks                            and benefits, the patient was deemed in                            satisfactory condition to undergo the procedure.  After obtaining informed consent, the colonoscope                            was passed under direct vision. Throughout the                            procedure, the patient's blood pressure, pulse, and                            oxygen saturations were monitored continuously. The                            Olympus [id number]) Colonoscope was                            introduced through the  anus and advanced to the the                            cecum, identified by appendiceal orifice and                            ileocecal valve. The colonoscopy was performed                            without difficulty. The patient tolerated the                            procedure well. The quality of the bowel                            preparation was good. The ileocecal valve,                            appendiceal orifice, and rectum were photographed. Scope In: 10:43:23 AM Scope Out: 10:59:49 AM Scope Withdrawal Time: 0 hours 11 minutes 25 seconds  Total Procedure Duration: 0 hours 16 minutes 26 seconds  Findings:                 The perianal and digital rectal examinations were                            normal.                           Three sessile polyps were found in the rectum and                            transverse colon. The polyps were 4 to 10 mm in                            size. These polyps were removed with a cold snare.                            Resection and retrieval were complete.  Scattered small and large-mouthed diverticula were                            found in the sigmoid colon. There was narrowing of                            the colon in association with the diverticular                            opening. Peri-diverticular erythema was seen. There                            was evidence of an impacted diverticulum. There was                            no evidence of diverticular bleeding.                           Non-bleeding external and internal hemorrhoids were                            found during retroflexion. The hemorrhoids were                            medium-sized. Complications:            No immediate complications. Estimated Blood Loss:     Estimated blood loss was minimal. Impression:               - Three 4 to 10 mm polyps in the rectum and in the                            transverse colon, removed with  a cold snare.                            Resected and retrieved.                           - Moderate diverticulosis in the sigmoid colon.                            There was narrowing of the colon in association                            with the diverticular opening. Peri-diverticular                            erythema was seen. There was evidence of an                            impacted diverticulum. There was no evidence of                            diverticular bleeding.                           -  Non-bleeding external and internal hemorrhoids. Recommendation:           - Patient has a contact number available for                            emergencies. The signs and symptoms of potential                            delayed complications were discussed with the                            patient. Return to normal activities tomorrow.                            Written discharge instructions were provided to the                            patient.                           - Resume previous diet.                           - Continue present medications.                           - Use Benefiber one teaspoon PO TID.                           - No aspirin, ibuprofen, naproxen, or other                            non-steroidal anti-inflammatory drugs for 2 weeks.                           - Await pathology results.                           - Repeat colonoscopy in 3 - 5 years for                            surveillance based on pathology results. Mauri Pole, MD 07/19/2020 11:08:48 AM This report has been signed electronically.

## 2020-07-21 ENCOUNTER — Telehealth: Payer: Self-pay

## 2020-07-21 NOTE — Telephone Encounter (Signed)
LVM

## 2020-08-06 ENCOUNTER — Encounter: Payer: Self-pay | Admitting: Gastroenterology

## 2020-10-31 ENCOUNTER — Emergency Department (HOSPITAL_COMMUNITY): Payer: BC Managed Care – PPO

## 2020-10-31 ENCOUNTER — Encounter (HOSPITAL_COMMUNITY): Payer: Self-pay

## 2020-10-31 ENCOUNTER — Other Ambulatory Visit: Payer: Self-pay

## 2020-10-31 ENCOUNTER — Emergency Department (HOSPITAL_COMMUNITY)
Admission: EM | Admit: 2020-10-31 | Discharge: 2020-10-31 | Discharge status: Against Medical Advice | Payer: BC Managed Care – PPO | Source: Home / Self Care | Attending: Emergency Medicine | Admitting: Emergency Medicine

## 2020-10-31 DIAGNOSIS — R1013 Epigastric pain: Secondary | ICD-10-CM | POA: Insufficient documentation

## 2020-10-31 DIAGNOSIS — R1011 Right upper quadrant pain: Secondary | ICD-10-CM | POA: Diagnosis not present

## 2020-10-31 DIAGNOSIS — R109 Unspecified abdominal pain: Secondary | ICD-10-CM

## 2020-10-31 DIAGNOSIS — I10 Essential (primary) hypertension: Secondary | ICD-10-CM | POA: Insufficient documentation

## 2020-10-31 DIAGNOSIS — K8066 Calculus of gallbladder and bile duct with acute and chronic cholecystitis without obstruction: Secondary | ICD-10-CM | POA: Diagnosis not present

## 2020-10-31 LAB — CBC WITH DIFFERENTIAL/PLATELET
Abs Immature Granulocytes: 0.13 10*3/uL — ABNORMAL HIGH (ref 0.00–0.07)
Basophils Absolute: 0 10*3/uL (ref 0.0–0.1)
Basophils Relative: 0 %
Eosinophils Absolute: 0.1 10*3/uL (ref 0.0–0.5)
Eosinophils Relative: 0 %
HCT: 46.1 % (ref 39.0–52.0)
Hemoglobin: 15.4 g/dL (ref 13.0–17.0)
Immature Granulocytes: 1 %
Lymphocytes Relative: 7 %
Lymphs Abs: 1.1 10*3/uL (ref 0.7–4.0)
MCH: 28.9 pg (ref 26.0–34.0)
MCHC: 33.4 g/dL (ref 30.0–36.0)
MCV: 86.7 fL (ref 80.0–100.0)
Monocytes Absolute: 1 10*3/uL (ref 0.1–1.0)
Monocytes Relative: 6 %
Neutro Abs: 14.4 10*3/uL — ABNORMAL HIGH (ref 1.7–7.7)
Neutrophils Relative %: 86 %
Platelets: 232 10*3/uL (ref 150–400)
RBC: 5.32 MIL/uL (ref 4.22–5.81)
RDW: 13.9 % (ref 11.5–15.5)
WBC: 16.8 10*3/uL — ABNORMAL HIGH (ref 4.0–10.5)
nRBC: 0 % (ref 0.0–0.2)

## 2020-10-31 LAB — COMPREHENSIVE METABOLIC PANEL WITH GFR
ALT: 181 U/L — ABNORMAL HIGH (ref 0–44)
AST: 83 U/L — ABNORMAL HIGH (ref 15–41)
Albumin: 3.7 g/dL (ref 3.5–5.0)
Alkaline Phosphatase: 241 U/L — ABNORMAL HIGH (ref 38–126)
Anion gap: 9 (ref 5–15)
BUN: 11 mg/dL (ref 6–20)
CO2: 26 mmol/L (ref 22–32)
Calcium: 9.8 mg/dL (ref 8.9–10.3)
Chloride: 102 mmol/L (ref 98–111)
Creatinine, Ser: 0.95 mg/dL (ref 0.61–1.24)
GFR, Estimated: >60 mL/min
Glucose, Bld: 167 mg/dL — ABNORMAL HIGH (ref 70–99)
Potassium: 4.3 mmol/L (ref 3.5–5.1)
Sodium: 137 mmol/L (ref 135–145)
Total Bilirubin: 3.9 mg/dL — ABNORMAL HIGH (ref 0.3–1.2)
Total Protein: 7.9 g/dL (ref 6.5–8.1)

## 2020-10-31 LAB — URINALYSIS, ROUTINE W REFLEX MICROSCOPIC
Glucose, UA: NEGATIVE mg/dL
Hgb urine dipstick: NEGATIVE
Ketones, ur: NEGATIVE mg/dL
Leukocytes,Ua: NEGATIVE
Nitrite: NEGATIVE
Protein, ur: 30 mg/dL — AB
Specific Gravity, Urine: 1.028 (ref 1.005–1.030)
pH: 5 (ref 5.0–8.0)

## 2020-10-31 LAB — LIPASE, BLOOD: Lipase: 67 U/L — ABNORMAL HIGH (ref 11–51)

## 2020-10-31 IMAGING — CT CT ABD-PELV W/ CM
2 of 5 series · 15 of 46 positions shown, 17 images · IV contrast (omnipaque)
Comparison: None.

CLINICAL DATA: Abdominal distension, abdominal pain.

EXAM:
CT ABDOMEN AND PELVIS WITH CONTRAST
TECHNIQUE: Multidetector CT imaging of the abdomen and pelvis was performed
using the standard protocol following bolus administration of
intravenous contrast.
CONTRAST:  80mL OMNIPAQUE IOHEXOL 350 MG/ML SOLN

[Series 2: axial st · axial · 0.78mm/px · z∈[-516,-106]mm · 12 of 98 slices shown, 14 images]
[im 8/98  soft-tissue]
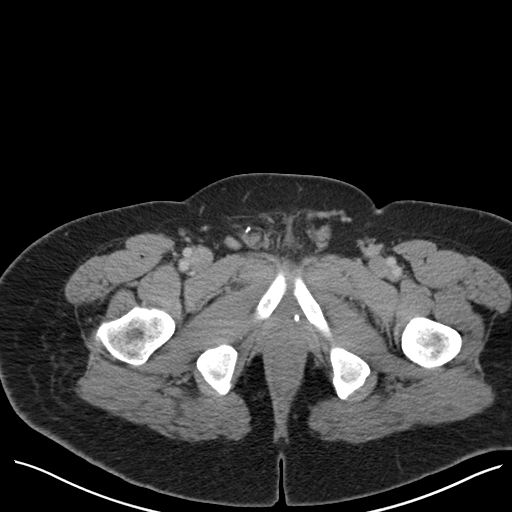
[im 8/98  bone]
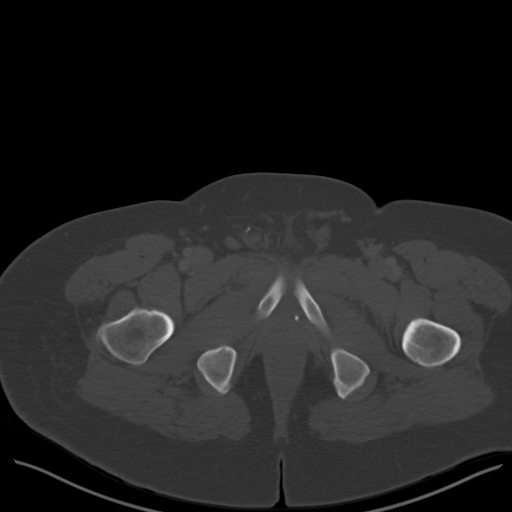
[im 15/98  soft-tissue]
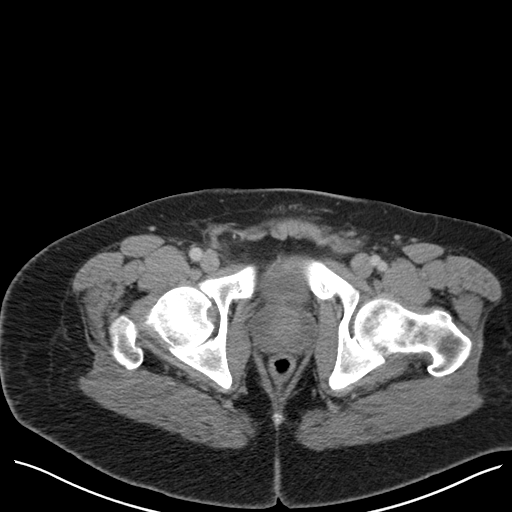
[im 23/98  soft-tissue]
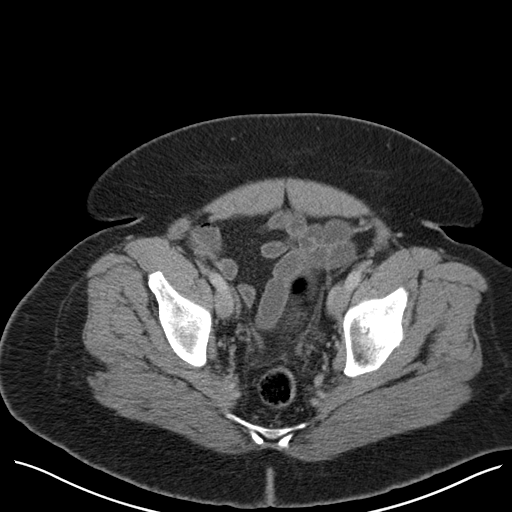
[im 30/98  soft-tissue]
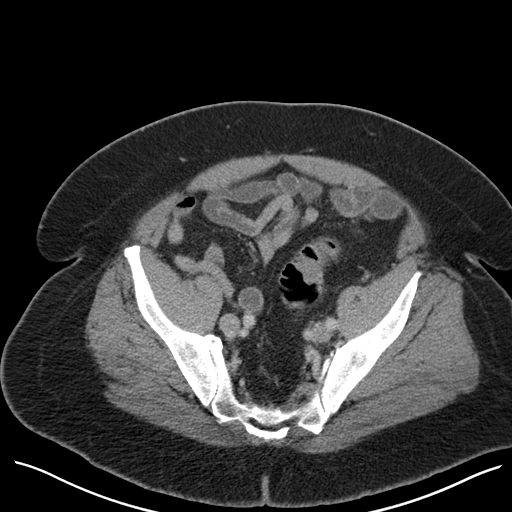
[im 38/98  soft-tissue]
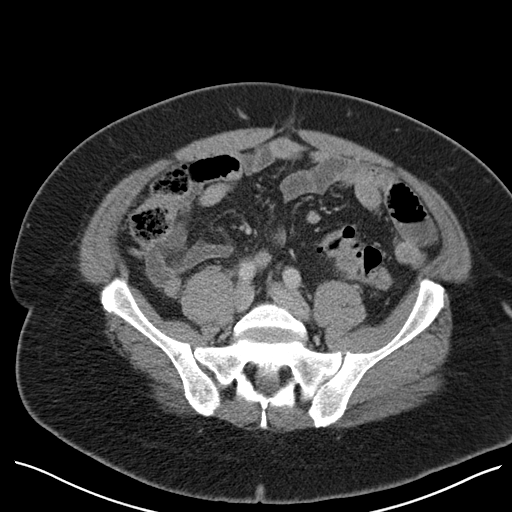
[im 45/98  soft-tissue]
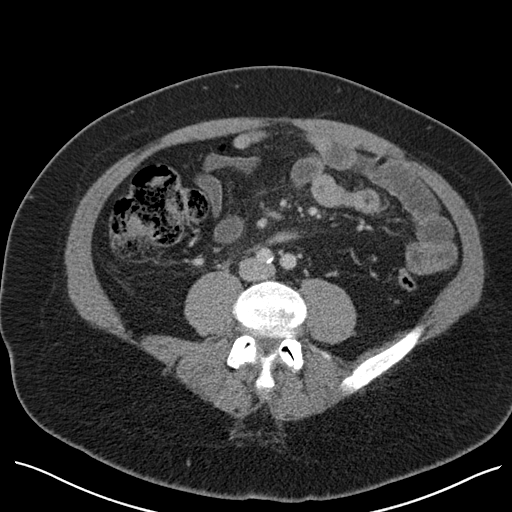
[im 53/98  soft-tissue]
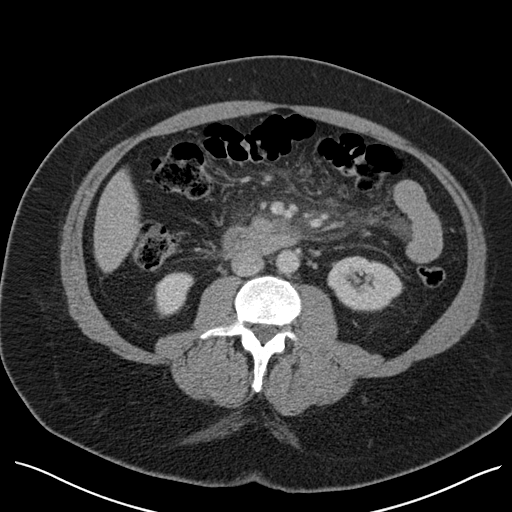
[im 60/98  soft-tissue]
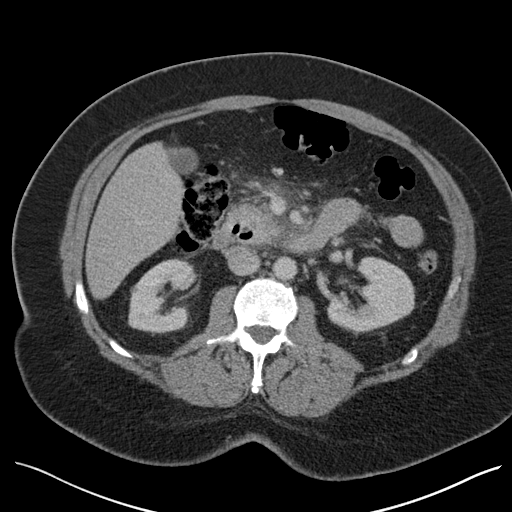
[im 68/98  soft-tissue]
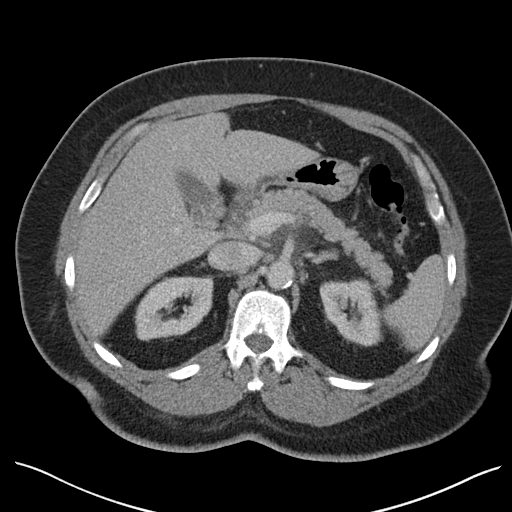
[im 68/98  bone]
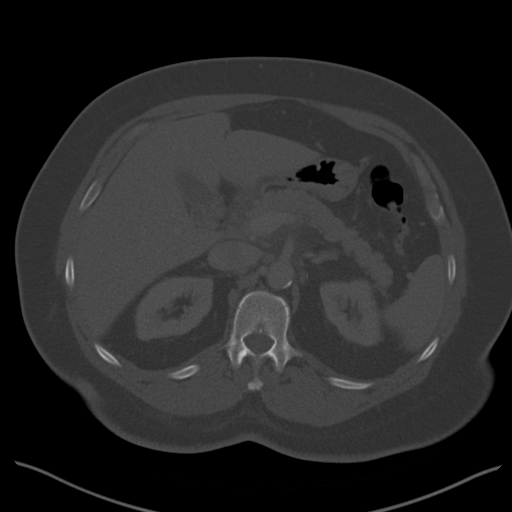
[im 75/98  soft-tissue]
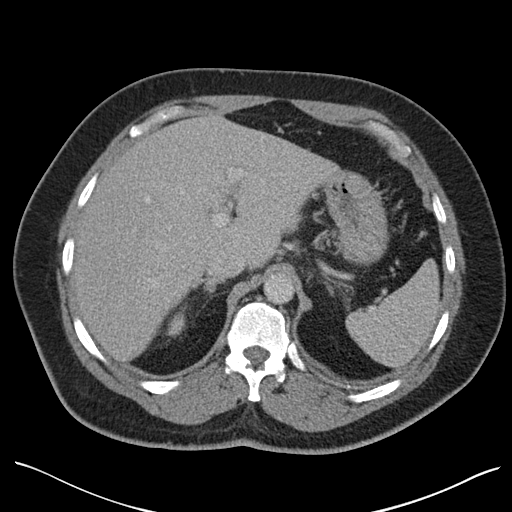
[im 83/98  soft-tissue]
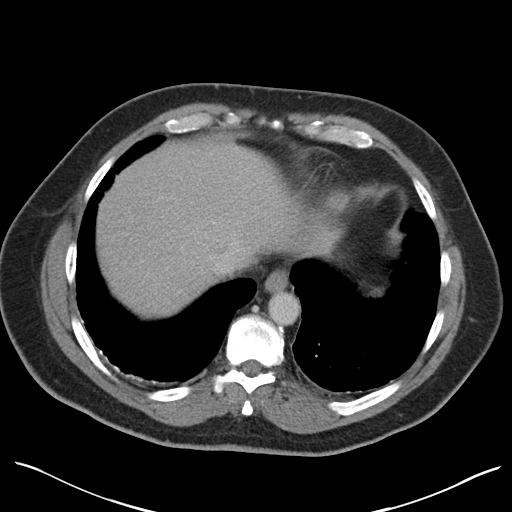
[im 90/98  soft-tissue]
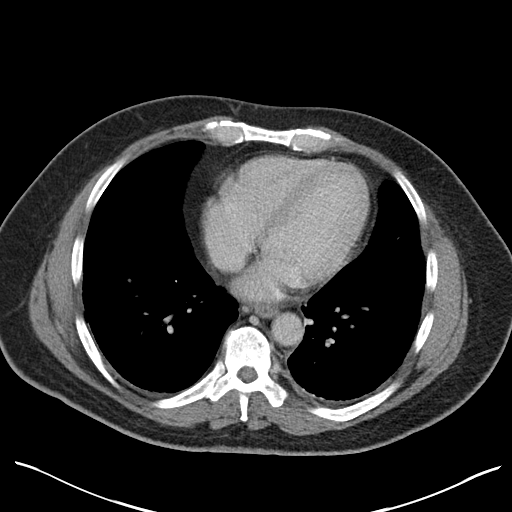

[Series 5: coronal st · coronal · 0.93mm/px · 3 of 164 slices shown]
[im 55/164  soft-tissue]
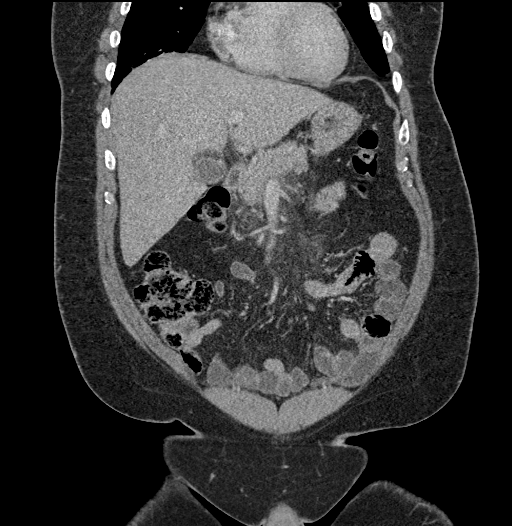
[im 73/164  soft-tissue]
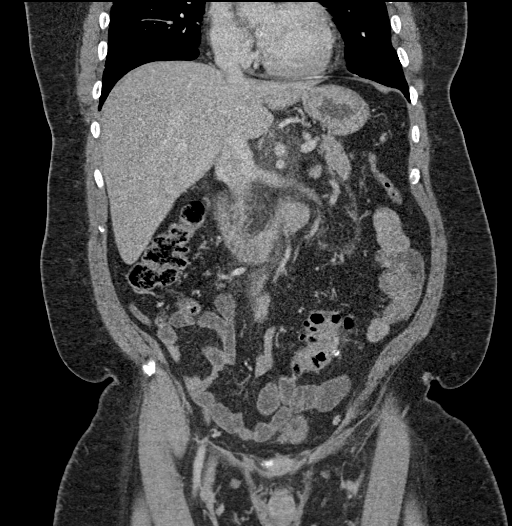
[im 91/164  soft-tissue]
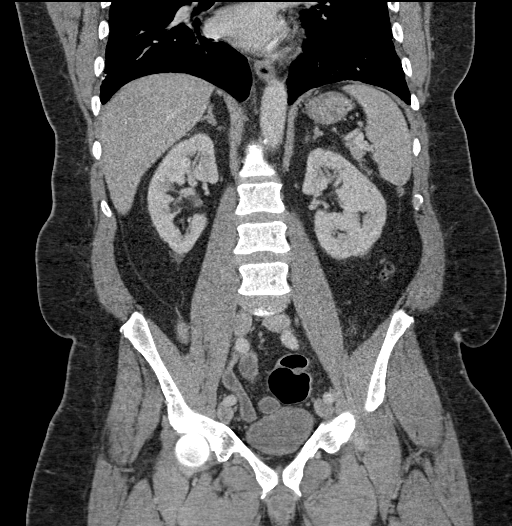

[15 of 46 positions shown; findings below may reference images not displayed]

FINDINGS: Lower chest: Mild bibasilar scarring/atelectasis.

Hepatobiliary: No focal liver abnormality is seen. Gallbladder walls
appear thickened but there is no pericholecystic fluid seen.

Pancreas: Ill-defined fluid/edema about the pancreatic head/proximal
body. Pancreatic tail appears normal.

Spleen: Normal in size without focal abnormality.

Adrenals/Urinary Tract: Adrenal glands appear normal. Kidneys are
unremarkable without mass, stone or hydronephrosis. No ureteral or
bladder calculi are identified. Bladder is unremarkable,
decompressed.

Stomach/Bowel: No dilated large or small bowel loops. Appendix is
normal. Diverticulosis within the upper sigmoid colon but no focal
inflammatory change to suggest acute diverticulitis. Probable
reactive thickening of the walls of the duodenum, given the
proximity to the peripancreatic inflammation. Stomach is
unremarkable, partially decompressed.

Vascular/Lymphatic: Mild aortic atherosclerosis. No acute-appearing
vascular abnormality. No enlarged lymph nodes seen in the abdomen or
pelvis.

Reproductive: Prostate is unremarkable.

Other: No active hemorrhage or abscess collection. No free
intraperitoneal air.

Musculoskeletal: No acute or suspicious osseous abnormality. Small
RIGHT inguinal hernia which contains fat only.
IMPRESSION: 1. Findings are consistent with acute pancreatitis. No evidence of
pancreatic necrosis.
2. Gallbladder walls appear thickened but there is no
pericholecystic fluid seen, likely reactive to the adjacent
pancreatitis.
3. Similarly, suspect reactive thickening of the walls of the
duodenum given the adjacent peripancreatic inflammation.
4. Colonic diverticulosis without evidence of acute diverticulitis.
5. Small RIGHT inguinal hernia which contains fat only.

Aortic Atherosclerosis ([QV]-[QV]).

## 2020-10-31 IMAGING — US US ABDOMEN LIMITED
1 series · 15 of 25 positions shown · non-contrast
Comparison: None.

CLINICAL DATA: Right upper quadrant pain for 5 days.

EXAM:
ULTRASOUND ABDOMEN LIMITED RIGHT UPPER QUADRANT

[Series 1: us abdomen limited ruq mc & wl · 15 of 41 slices shown]
[im 1/41]
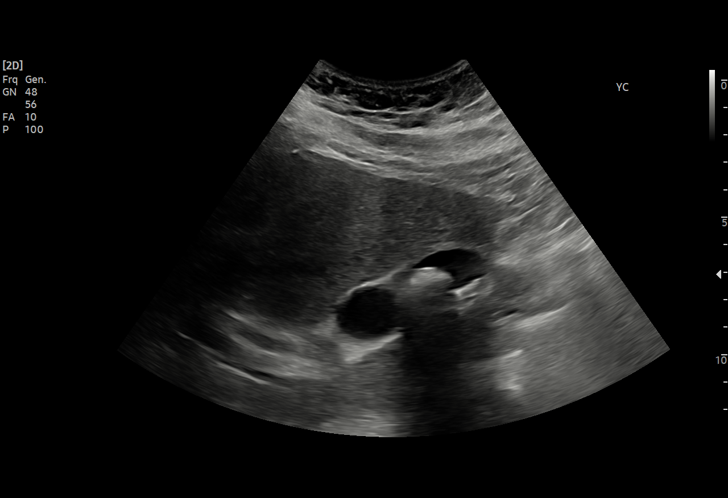
[im 4/41]
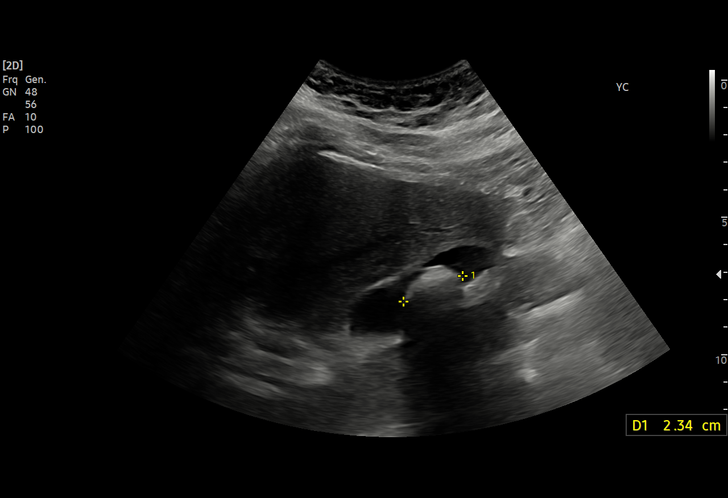
[im 7/41]
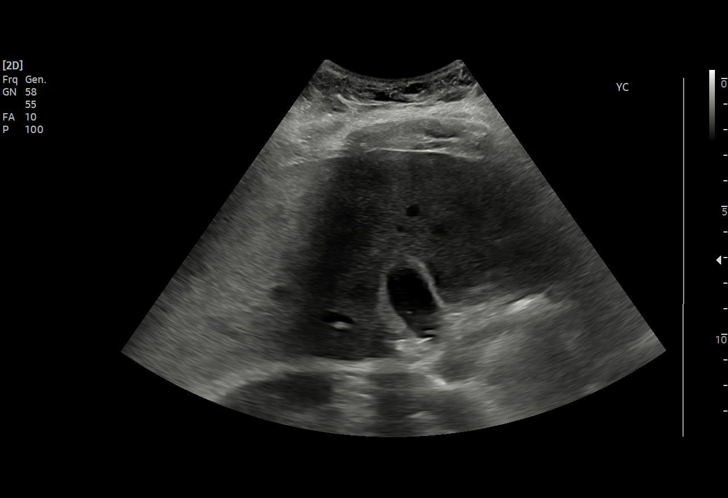
[im 9/41]
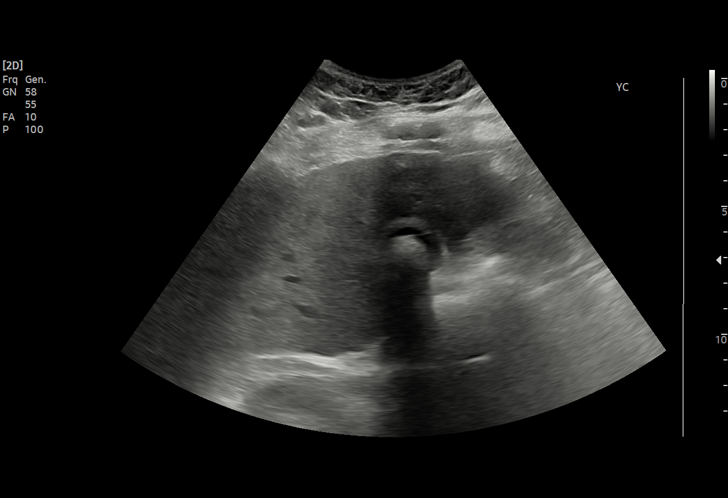
[im 12/41]
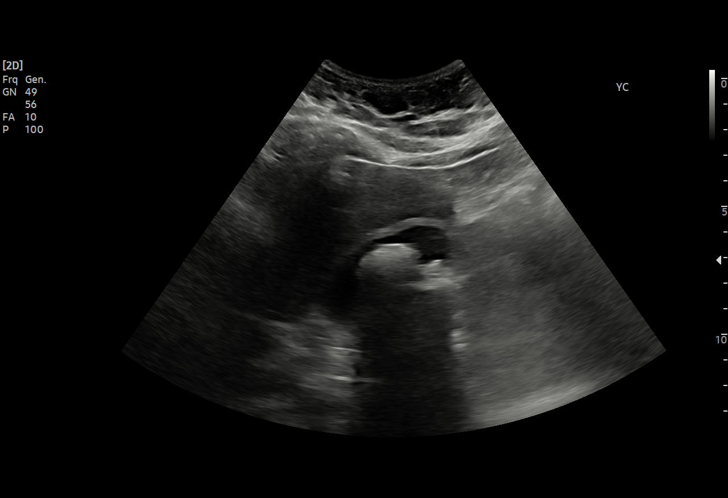
[im 16/41]
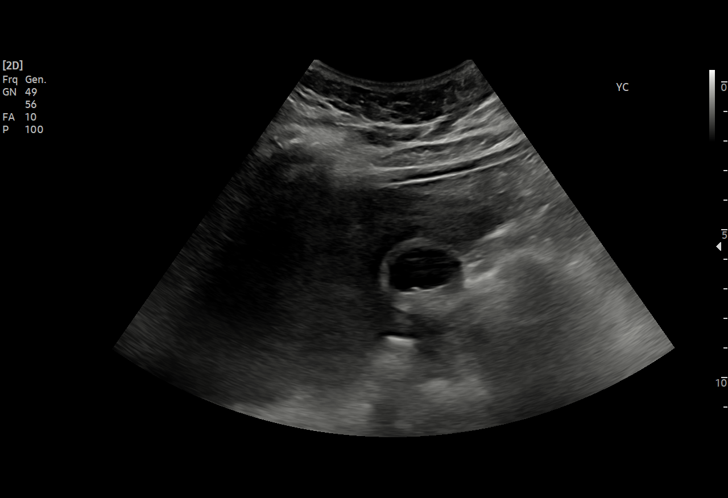
[im 17/41]
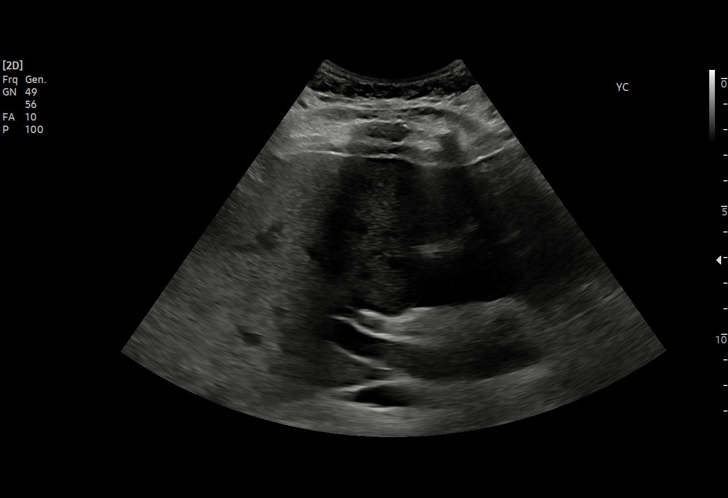
[im 21/41]
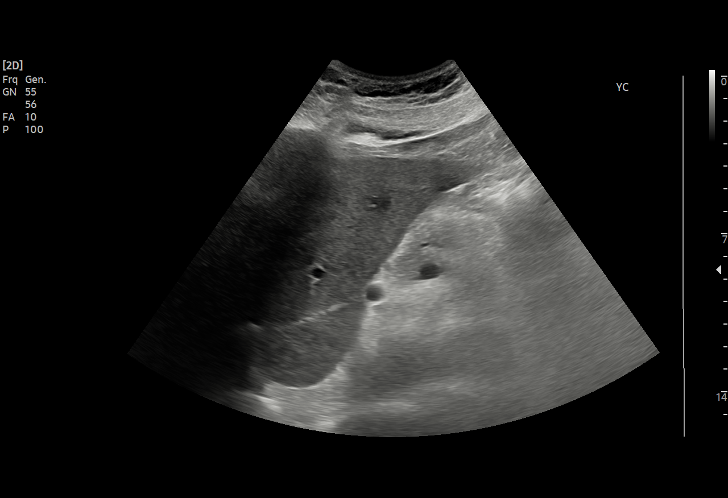
[im 24/41]
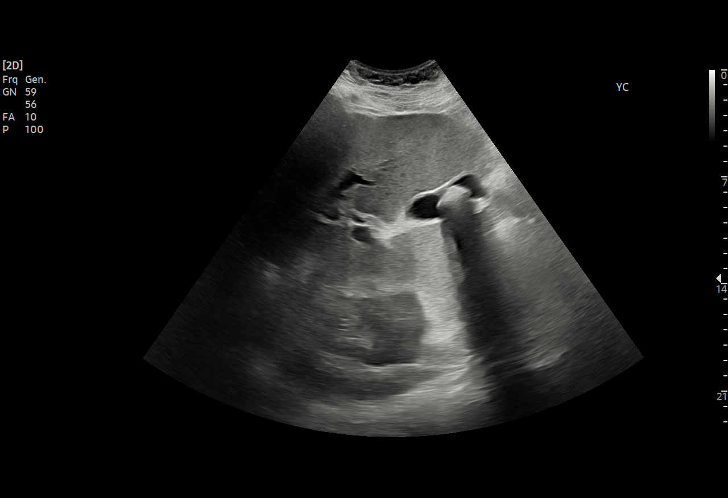
[im 26/41]
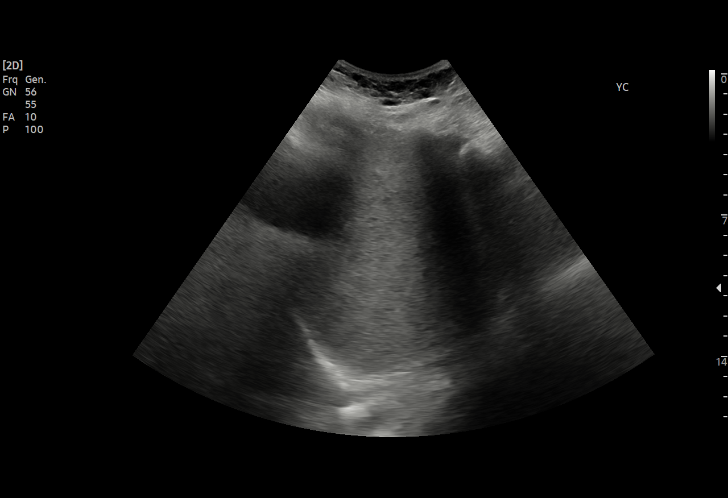
[im 29/41]
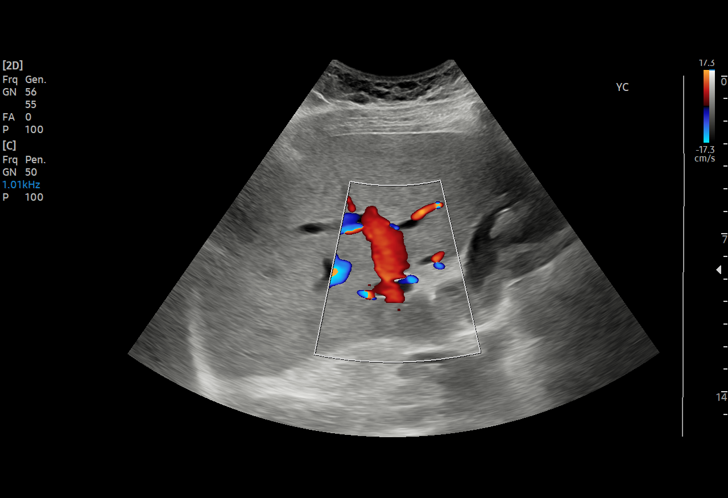
[im 32/41]
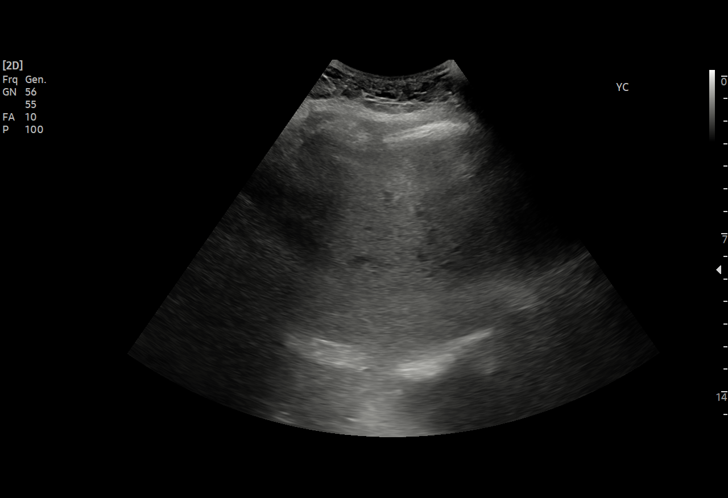
[im 34/41]
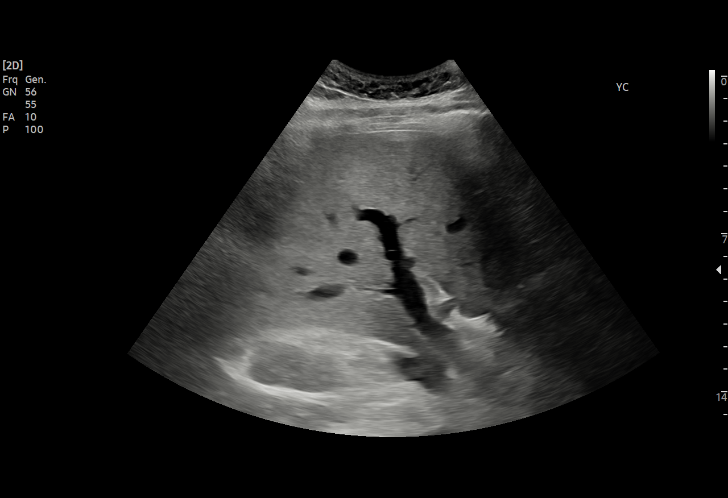
[im 37/41]
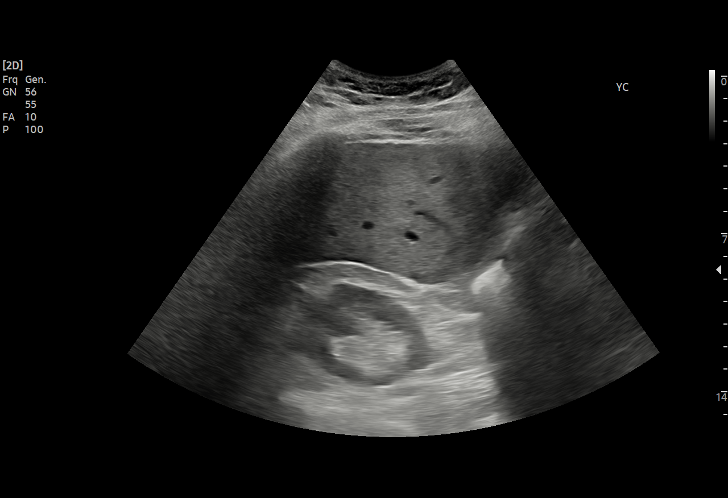
[im 41/41]
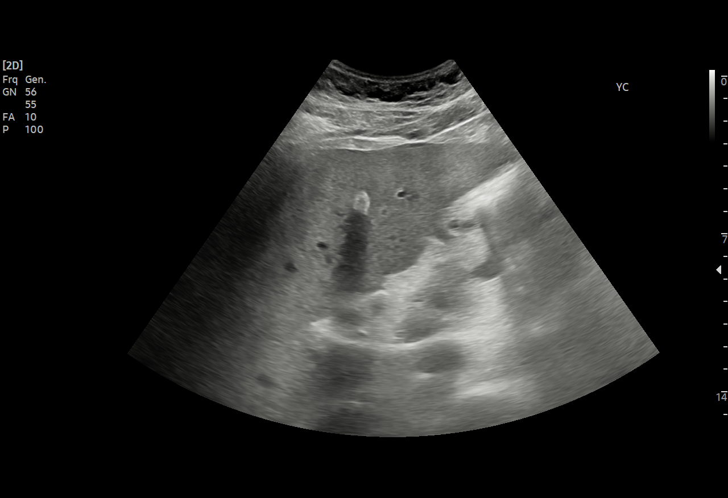

[15 of 25 positions shown; findings below may reference images not displayed]

FINDINGS: Gallbladder:

Physiologically distended. Shadowing intraluminal gallstone
measuring 2.3 cm. Gallbladder wall thickness is upper normal at 2 to
3 mm. No pericholecystic fluid. No sonographic Murphy sign noted by
sonographer.

Common bile duct:

Diameter: 5 mm, normal

Liver:

No focal lesion identified. Within normal limits in parenchymal
echogenicity. Portal vein is patent on color Doppler imaging with
normal direction of blood flow towards the liver.

Other: No right upper quadrant ascites.
IMPRESSION: 1. Gallstone without sonographic findings of acute cholecystitis.
2. No biliary dilatation.

## 2020-10-31 MED ORDER — SODIUM CHLORIDE 0.9 % IV BOLUS
1000.0000 mL | Freq: Once | INTRAVENOUS | Status: AC
  Administered 2020-10-31: 1000 mL via INTRAVENOUS

## 2020-10-31 MED ORDER — SODIUM CHLORIDE 0.9 % IV BOLUS: 30.0000 mL/kg | Freq: Once | INTRAVENOUS | Status: DC

## 2020-10-31 MED ORDER — IOHEXOL 350 MG/ML SOLN
80.0000 mL | Freq: Once | INTRAVENOUS | Status: AC | PRN
  Administered 2020-10-31: 80 mL via INTRAVENOUS

## 2020-10-31 MED ORDER — AMLODIPINE BESYLATE 5 MG PO TABS
5.0000 mg | ORAL_TABLET | Freq: Once | ORAL | Status: AC
  Administered 2020-10-31: 5 mg via ORAL
  Filled 2020-10-31: qty 1

## 2020-10-31 MED ORDER — MORPHINE SULFATE (PF) 4 MG/ML IV SOLN
4.0000 mg | Freq: Once | INTRAVENOUS | Status: DC
  Filled 2020-10-31: qty 1

## 2020-10-31 NOTE — ED Notes (Signed)
US at bedside

## 2020-10-31 NOTE — Discharge Instructions (Addendum)
Return at any time if you change your mind and would like to get continued medical care.  Otherwise follow-up with your doctor or gastroenterology soon as you can.

## 2020-10-31 NOTE — ED Provider Notes (Signed)
Emergency Medicine Provider Triage Evaluation Note  Sean Valencia , a 60 y.o. adult  was evaluated in triage.  Pt complains of hematuria.  Patient states about 5 days ago he began experiencing central abdominal pain as well as dark urine with traces of blood.  He went to urgent care earlier today and was told to come to the emergency department for further evaluation.  Reports an episode of vomiting 2 days ago.  No persistent vomiting or diarrhea.  Reports associated fatigue.  No chest pain, shortness of breath, or LOC.  He is not anticoagulated.  Denies a smoking history.  Physical Exam  BP (!) 173/94 (BP Location: Left Arm)   Pulse 95   Temp 98 F (36.7 C) (Oral)   Ht '5\' 11"'$  (1.803 m)   Wt 113.4 kg   SpO2 96%   BMI 34.87 kg/m  Gen:   Awake, no distress   Resp:  Normal effort  MSK:   Moves extremities without difficulty  Other:  Protuberant abdomen that is soft.  Tenderness noted along the periumbilical region.  Medical Decision Making  Medically screening exam initiated at 4:46 PM.  Appropriate orders placed.  HAKIM HANN was informed that the remainder of the evaluation will be completed by another provider, this initial triage assessment does not replace that evaluation, and the importance of remaining in the ED until their evaluation is complete.   Rayna Sexton, PA-C 10/31/20 1647    Luna Fuse, MD 10/31/20 2155

## 2020-10-31 NOTE — ED Triage Notes (Signed)
Pt presents to the ED for hematuria and abd pain. He states he was seen at Urgent Care for his sx PTA to this ED and was told at Urgent Care that there was blood in his urine. Pt states his sx have been ongoing since 10/26/2020. Pt has a past hx of kidney stones, however he states his current sx are similar to previous. He denies any bloody emesis or dark tarry stools/bloody stools. Pt denies any hx of past blood transfusions. Pt reports accompanying sx of fatigue. He denies any CP, SOB, dizziness, or LOC. Pt does not take a blood thinner.

## 2020-10-31 NOTE — ED Notes (Signed)
PA-C at the bedside.  ?

## 2020-10-31 NOTE — ED Provider Notes (Signed)
[facility] DEPT Provider Note   CSN: [id number] Arrival date & time: 10/31/20  1605     History   Sean Valencia is a 60 y.o. adult.  Patient presents chief complaint of nominal pain.  Triage notation states hematuria, however he had abdominal pain in the epigastric region.  He went to urgent care and was told he had some blood in the urinalysis and told to go to the ER.  He describes epigastric pain ongoing for about 5 days.  Describes sharp and aching nonradiating.  No associated fevers no cough no vomiting no diarrhea.  Does not drink any alcohol.  Denies any drug use.      Past Medical History:  Diagnosis Date   Allergy    Anal fistula    Arthritis    History of kidney stones    HTN (hypertension)     There are no problems to display for this patient.   Past Surgical History:  Procedure Laterality Date   INGUINAL HERNIA REPAIR Bilateral 1992     OB History   No obstetric history on file.     Family History  Problem Relation Age of Onset   Diabetes Father    Hypertension Father    Bone cancer Maternal Grandfather    Diabetes Paternal Grandmother    Diabetes Paternal Grandfather    Leukemia Paternal Grandfather    Hypercalcemia Brother    Colon cancer Neg Hx    Esophageal cancer Neg Hx    Stomach cancer Neg Hx    Rectal cancer Neg Hx     Social History   Tobacco Use   Smoking status: Never   Smokeless tobacco: Never  Vaping Use   Vaping Use: Never used  Substance Use Topics   Alcohol use: Yes    Comment: 4 per year   Drug use: No    Home Medications Prior to Admission medications   Medication Sig Start Date End Date Taking? Authorizing Provider  loratadine-pseudoephedrine (CLARITIN-D 24-HOUR) 10-240 MG per 24 hr tablet Take 1 tablet by mouth daily.    [provider]  triamcinolone (NASACORT) 55 MCG/ACT AERO nasal inhaler Place 2 sprays into the nose daily.    [provider]    Allergies     Patient has no known allergies.  Review of Systems   Review of Systems  Constitutional:  Negative for fever.  HENT:  Negative for ear pain and sore throat.   Eyes:  Negative for pain.  Respiratory:  Negative for cough.   Cardiovascular:  Negative for chest pain.  Gastrointestinal:  Positive for abdominal pain.  Genitourinary:  Negative for flank pain.  Musculoskeletal:  Negative for back pain.  Skin:  Negative for color change and rash.  Neurological:  Negative for syncope.  All other systems reviewed and are negative.  Physical Exam Updated Vital Signs BP (!) 183/107   Pulse 89   Temp 98 F (36.7 C) (Oral)   Resp 17   Ht '5\' 11"'$  (1.803 m)   Wt 113.4 kg   SpO2 100%   BMI 34.87 kg/m   Physical Exam Constitutional:      Appearance: He is well-developed.  HENT:     Head: Normocephalic.     Nose: Nose normal.  Eyes:     Extraocular Movements: Extraocular movements intact.  Cardiovascular:     Rate and Rhythm: Normal rate.  Pulmonary:     Effort: Pulmonary effort is normal.  Abdominal:  Tenderness: There is abdominal tenderness.     Comments: Moderate epigastric tenderness on evaluation.  Skin:    Coloration: Skin is not jaundiced.  Neurological:     Mental Status: He is alert. Mental status is at baseline.    ED Results / Procedures / Treatments   Labs (all labs ordered are listed, but only abnormal results are displayed) Labs Reviewed  COMPREHENSIVE METABOLIC PANEL - Abnormal; Notable for the following components:      Result Value   Glucose, Bld 167 (*)    AST 83 (*)    ALT 181 (*)    Alkaline Phosphatase 241 (*)    Total Bilirubin 3.9 (*)    All other components within normal limits  CBC WITH DIFFERENTIAL/PLATELET - Abnormal; Notable for the following components:   WBC 16.8 (*)    Neutro Abs 14.4 (*)    Abs Immature Granulocytes 0.13 (*)    All other components within normal limits  URINALYSIS, ROUTINE W REFLEX MICROSCOPIC - Abnormal; Notable for  the following components:   Color, Urine RED (*)    APPearance TURBID (*)    Bilirubin Urine SMALL (*)    Protein, ur 30 (*)    Bacteria, UA RARE (*)    All other components within normal limits  LIPASE, BLOOD - Abnormal; Notable for the following components:   Lipase 67 (*)    All other components within normal limits  URINE CULTURE    EKG None  Radiology CT Abdomen Pelvis W Contrast  Result Date: 10/31/2020 CLINICAL DATA:  Abdominal distension, abdominal pain. EXAM: CT ABDOMEN AND PELVIS WITH CONTRAST TECHNIQUE: Multidetector CT imaging of the abdomen and pelvis was performed using the standard protocol following bolus administration of intravenous contrast. CONTRAST:  63m OMNIPAQUE IOHEXOL 350 MG/ML SOLN COMPARISON:  None. FINDINGS: Lower chest: Mild bibasilar scarring/atelectasis. Hepatobiliary: No focal liver abnormality is seen. Gallbladder walls appear thickened but there is no pericholecystic fluid seen. Pancreas: Ill-defined fluid/edema about the pancreatic head/proximal body. Pancreatic tail appears normal. Spleen: Normal in size without focal abnormality. Adrenals/Urinary Tract: Adrenal glands appear normal. Kidneys are unremarkable without mass, stone or hydronephrosis. No ureteral or bladder calculi are identified. Bladder is unremarkable, decompressed. Stomach/Bowel: No dilated large or small bowel loops. Appendix is normal. Diverticulosis within the upper sigmoid colon but no focal inflammatory change to suggest acute diverticulitis. Probable reactive thickening of the walls of the duodenum, given the proximity to the peripancreatic inflammation. Stomach is unremarkable, partially decompressed. Vascular/Lymphatic: Mild aortic atherosclerosis. No acute-appearing vascular abnormality. No enlarged lymph nodes seen in the abdomen or pelvis. Reproductive: Prostate is unremarkable. Other: No active hemorrhage or abscess collection. No free intraperitoneal air. Musculoskeletal: No acute  or suspicious osseous abnormality. Small RIGHT inguinal hernia which contains fat only. IMPRESSION: 1. Findings are consistent with acute pancreatitis. No evidence of pancreatic necrosis. 2. Gallbladder walls appear thickened but there is no pericholecystic fluid seen, likely reactive to the adjacent pancreatitis. 3. Similarly, suspect reactive thickening of the walls of the duodenum given the adjacent peripancreatic inflammation. 4. Colonic diverticulosis without evidence of acute diverticulitis. 5. Small RIGHT inguinal hernia which contains fat only. Aortic Atherosclerosis (ICD10-I70.0). Electronically Signed   By: SFranki CabotM.D.   On: 10/31/2020 20:11   UKoreaAbdomen Limited RUQ (LIVER/GB)  Result Date: 10/31/2020 CLINICAL DATA:  Right upper quadrant pain for 5 days. EXAM: ULTRASOUND ABDOMEN LIMITED RIGHT UPPER QUADRANT COMPARISON:  None. FINDINGS: Gallbladder: Physiologically distended. Shadowing intraluminal gallstone measuring 2.3 cm. Gallbladder wall thickness is  upper normal at 2 to 3 mm. No pericholecystic fluid. No sonographic Murphy sign noted by sonographer. Common bile duct: Diameter: 5 mm, normal Liver: No focal lesion identified. Within normal limits in parenchymal echogenicity. Portal vein is patent on color Doppler imaging with normal direction of blood flow towards the liver. Other: No right upper quadrant ascites. IMPRESSION: 1. Gallstone without sonographic findings of acute cholecystitis. 2. No biliary dilatation. Electronically Signed   By: Keith Rake M.D.   On: 10/31/2020 19:57    Procedures Procedures   Medications Ordered in ED Medications  morphine 4 MG/ML injection 4 mg (4 mg Intravenous Not Given 10/31/20 2112)  iohexol (OMNIPAQUE) 350 MG/ML injection 80 mL (80 mLs Intravenous Contrast Given 10/31/20 1955)  sodium chloride 0.9 % bolus 1,000 mL (1,000 mLs Intravenous New Bag/Given 10/31/20 2029)  sodium chloride 0.9 % bolus 1,000 mL (1,000 mLs Intravenous New Bag/Given  10/31/20 2112)  amLODipine (NORVASC) tablet 5 mg (5 mg Oral Given 10/31/20 2142)    ED Course  I have reviewed the triage vital signs and the nursing notes.  Pertinent labs & imaging results that were available during my care of the patient were reviewed by me and considered in my medical decision making (see chart for details).    MDM Rules/Calculators/A&P                           Labs show leukocytosis white count 16.  Lipase is near normal mildly elevated.  Liver enzymes elevated T bili appears elevated as well.  Ultrasound shows gallstones but no evidence of acute cholecystitis.  CT abdomen pelvis concerning for acute pancreatitis as cause of the patient's pain.  Given elevated white count and elevated liver enzymes I did advise the patient stay in the hospital overnight for GI evaluation.  However the patient states that he has children at home who he is taking care of as well as closing on a home sale tomorrow and that he cannot miss being there tomorrow.  Risk and benefits of leaving against medical vice discussed.  Patient states that he will return tomorrow as needed.  Advised immediate return if he changes his mind or would like further medical care otherwise patient leaving [facility].  Final Clinical Impression(s) / ED Diagnoses Final diagnoses:  Right sided abdominal pain    Rx / DC Orders ED Discharge Orders     None        Luna Fuse, MD 10/31/20 2159

## 2020-11-01 ENCOUNTER — Inpatient Hospital Stay (HOSPITAL_COMMUNITY)
Admission: EM | Admit: 2020-11-01 | Discharge: 2020-11-08 | DRG: 417 | Disposition: A | Discharge status: Home / Self Care | Payer: BC Managed Care – PPO | Attending: Family Medicine | Admitting: Family Medicine

## 2020-11-01 ENCOUNTER — Encounter (HOSPITAL_COMMUNITY): Payer: Self-pay

## 2020-11-01 ENCOUNTER — Other Ambulatory Visit: Payer: Self-pay

## 2020-11-01 DIAGNOSIS — K851 Biliary acute pancreatitis without necrosis or infection: Secondary | ICD-10-CM | POA: Diagnosis present

## 2020-11-01 DIAGNOSIS — K567 Ileus, unspecified: Secondary | ICD-10-CM | POA: Diagnosis not present

## 2020-11-01 DIAGNOSIS — K8066 Calculus of gallbladder and bile duct with acute and chronic cholecystitis without obstruction: Principal | ICD-10-CM | POA: Diagnosis present

## 2020-11-01 DIAGNOSIS — R109 Unspecified abdominal pain: Secondary | ICD-10-CM

## 2020-11-01 DIAGNOSIS — R933 Abnormal findings on diagnostic imaging of other parts of digestive tract: Secondary | ICD-10-CM

## 2020-11-01 DIAGNOSIS — K4090 Unilateral inguinal hernia, without obstruction or gangrene, not specified as recurrent: Secondary | ICD-10-CM | POA: Diagnosis present

## 2020-11-01 DIAGNOSIS — R030 Elevated blood-pressure reading, without diagnosis of hypertension: Secondary | ICD-10-CM | POA: Diagnosis present

## 2020-11-01 DIAGNOSIS — K859 Acute pancreatitis without necrosis or infection, unspecified: Secondary | ICD-10-CM | POA: Diagnosis present

## 2020-11-01 DIAGNOSIS — K8050 Calculus of bile duct without cholangitis or cholecystitis without obstruction: Secondary | ICD-10-CM

## 2020-11-01 DIAGNOSIS — Z86010 Personal history of colonic polyps: Secondary | ICD-10-CM

## 2020-11-01 DIAGNOSIS — I10 Essential (primary) hypertension: Secondary | ICD-10-CM | POA: Diagnosis present

## 2020-11-01 DIAGNOSIS — Z79899 Other long term (current) drug therapy: Secondary | ICD-10-CM

## 2020-11-01 DIAGNOSIS — K5730 Diverticulosis of large intestine without perforation or abscess without bleeding: Secondary | ICD-10-CM | POA: Diagnosis present

## 2020-11-01 DIAGNOSIS — D649 Anemia, unspecified: Secondary | ICD-10-CM | POA: Diagnosis present

## 2020-11-01 DIAGNOSIS — K8020 Calculus of gallbladder without cholecystitis without obstruction: Secondary | ICD-10-CM | POA: Diagnosis present

## 2020-11-01 DIAGNOSIS — R1013 Epigastric pain: Secondary | ICD-10-CM

## 2020-11-01 DIAGNOSIS — Z20822 Contact with and (suspected) exposure to covid-19: Secondary | ICD-10-CM | POA: Diagnosis present

## 2020-11-01 DIAGNOSIS — Z8249 Family history of ischemic heart disease and other diseases of the circulatory system: Secondary | ICD-10-CM

## 2020-11-01 DIAGNOSIS — Z87442 Personal history of urinary calculi: Secondary | ICD-10-CM

## 2020-11-01 LAB — URINALYSIS, ROUTINE W REFLEX MICROSCOPIC
Bilirubin Urine: NEGATIVE
Glucose, UA: NEGATIVE mg/dL
Hgb urine dipstick: NEGATIVE
Ketones, ur: 20 mg/dL — AB
Leukocytes,Ua: NEGATIVE
Nitrite: NEGATIVE
Protein, ur: 100 mg/dL — AB
Specific Gravity, Urine: 1.024 (ref 1.005–1.030)
pH: 5 (ref 5.0–8.0)

## 2020-11-01 LAB — CBC WITH DIFFERENTIAL/PLATELET
Abs Immature Granulocytes: 0.34 10*3/uL — ABNORMAL HIGH (ref 0.00–0.07)
Basophils Absolute: 0.1 10*3/uL (ref 0.0–0.1)
Basophils Relative: 0 %
Eosinophils Absolute: 0.1 10*3/uL (ref 0.0–0.5)
Eosinophils Relative: 0 %
HCT: 43.5 % (ref 39.0–52.0)
Hemoglobin: 14.7 g/dL (ref 13.0–17.0)
Immature Granulocytes: 2 %
Lymphocytes Relative: 7 %
Lymphs Abs: 1.5 10*3/uL (ref 0.7–4.0)
MCH: 29.3 pg (ref 26.0–34.0)
MCHC: 33.8 g/dL (ref 30.0–36.0)
MCV: 86.8 fL (ref 80.0–100.0)
Monocytes Absolute: 1.2 10*3/uL — ABNORMAL HIGH (ref 0.1–1.0)
Monocytes Relative: 6 %
Neutro Abs: 17.4 10*3/uL — ABNORMAL HIGH (ref 1.7–7.7)
Neutrophils Relative %: 85 %
Platelets: 245 10*3/uL (ref 150–400)
RBC: 5.01 MIL/uL (ref 4.22–5.81)
RDW: 13.8 % (ref 11.5–15.5)
WBC: 20.5 10*3/uL — ABNORMAL HIGH (ref 4.0–10.5)
nRBC: 0 % (ref 0.0–0.2)

## 2020-11-01 LAB — COMPREHENSIVE METABOLIC PANEL
ALT: 119 U/L — ABNORMAL HIGH (ref 0–44)
AST: 41 U/L (ref 15–41)
Albumin: 3.6 g/dL (ref 3.5–5.0)
Alkaline Phosphatase: 214 U/L — ABNORMAL HIGH (ref 38–126)
Anion gap: 8 (ref 5–15)
BUN: 9 mg/dL (ref 6–20)
CO2: 26 mmol/L (ref 22–32)
Calcium: 9.8 mg/dL (ref 8.9–10.3)
Chloride: 103 mmol/L (ref 98–111)
Creatinine, Ser: 0.93 mg/dL (ref 0.61–1.24)
GFR, Estimated: >60 mL/min (ref >60)
Glucose, Bld: 149 mg/dL — ABNORMAL HIGH (ref 70–99)
Potassium: 4.9 mmol/L (ref 3.5–5.1)
Sodium: 137 mmol/L (ref 135–145)
Total Bilirubin: 2.7 mg/dL — ABNORMAL HIGH (ref 0.3–1.2)
Total Protein: 8.1 g/dL (ref 6.5–8.1)

## 2020-11-01 LAB — URINE CULTURE: Culture: NO GROWTH

## 2020-11-01 LAB — LIPASE, BLOOD: Lipase: 34 U/L (ref 11–51)

## 2020-11-01 NOTE — ED Notes (Signed)
Patients associate minister would like to contact the patient  Sean Valencia [phone]

## 2020-11-01 NOTE — ED Provider Notes (Signed)
Emergency Medicine Provider Triage Evaluation Note  Sean Valencia , a 60 y.o. adult  was evaluated in triage.  Pt complains of abdominal pain and hematuria.  Patient seen yesterday for the same complaint and recommended admission however, patient notes he had to leave.  CT abdomen performed yesterday which demonstrated concerns for acute pancreatitis.  Patient returns today due to continued pain. No alcohol use. No chronic NSAIDs.   Review of Systems  Positive: Abdominal pain, hematuria Negative:   Physical Exam  There were no vitals taken for this visit. Gen:   Awake, no distress   Resp:  Normal effort  MSK:   Moves extremities without difficulty  Other:    Medical Decision Making  Medically screening exam initiated at 6:56 PM.  Appropriate orders placed.  TREVIUS KEMPER was informed that the remainder of the evaluation will be completed by another provider, this initial triage assessment does not replace that evaluation, and the importance of remaining in the ED until their evaluation is complete.  Abdominal labs to compare from yesterday   Karie Kirks 11/01/20 1901    Dorie Rank, MD 11/02/20 [phone]

## 2020-11-01 NOTE — ED Triage Notes (Signed)
Pt reports abdominal pain and hematuria. Pt states he was here yesterday but left before being admitted because he has some important things to take care of at home.

## 2020-11-02 ENCOUNTER — Emergency Department (HOSPITAL_COMMUNITY): Payer: BC Managed Care – PPO

## 2020-11-02 ENCOUNTER — Encounter (HOSPITAL_COMMUNITY): Payer: Self-pay | Admitting: Internal Medicine

## 2020-11-02 ENCOUNTER — Inpatient Hospital Stay (HOSPITAL_COMMUNITY): Payer: BC Managed Care – PPO

## 2020-11-02 DIAGNOSIS — K8050 Calculus of bile duct without cholangitis or cholecystitis without obstruction: Secondary | ICD-10-CM | POA: Diagnosis not present

## 2020-11-02 DIAGNOSIS — R1011 Right upper quadrant pain: Secondary | ICD-10-CM | POA: Diagnosis present

## 2020-11-02 DIAGNOSIS — K851 Biliary acute pancreatitis without necrosis or infection: Secondary | ICD-10-CM | POA: Diagnosis present

## 2020-11-02 DIAGNOSIS — Z79899 Other long term (current) drug therapy: Secondary | ICD-10-CM | POA: Diagnosis not present

## 2020-11-02 DIAGNOSIS — Z87442 Personal history of urinary calculi: Secondary | ICD-10-CM | POA: Diagnosis not present

## 2020-11-02 DIAGNOSIS — Z20822 Contact with and (suspected) exposure to covid-19: Secondary | ICD-10-CM | POA: Diagnosis present

## 2020-11-02 DIAGNOSIS — K859 Acute pancreatitis without necrosis or infection, unspecified: Secondary | ICD-10-CM

## 2020-11-02 DIAGNOSIS — K4090 Unilateral inguinal hernia, without obstruction or gangrene, not specified as recurrent: Secondary | ICD-10-CM | POA: Diagnosis present

## 2020-11-02 DIAGNOSIS — Z8249 Family history of ischemic heart disease and other diseases of the circulatory system: Secondary | ICD-10-CM | POA: Diagnosis not present

## 2020-11-02 DIAGNOSIS — Z86010 Personal history of colonic polyps: Secondary | ICD-10-CM | POA: Diagnosis not present

## 2020-11-02 DIAGNOSIS — K567 Ileus, unspecified: Secondary | ICD-10-CM | POA: Diagnosis not present

## 2020-11-02 DIAGNOSIS — R030 Elevated blood-pressure reading, without diagnosis of hypertension: Secondary | ICD-10-CM | POA: Diagnosis present

## 2020-11-02 DIAGNOSIS — K8020 Calculus of gallbladder without cholecystitis without obstruction: Secondary | ICD-10-CM | POA: Diagnosis present

## 2020-11-02 DIAGNOSIS — K8066 Calculus of gallbladder and bile duct with acute and chronic cholecystitis without obstruction: Secondary | ICD-10-CM | POA: Diagnosis present

## 2020-11-02 DIAGNOSIS — D649 Anemia, unspecified: Secondary | ICD-10-CM | POA: Diagnosis present

## 2020-11-02 DIAGNOSIS — K5730 Diverticulosis of large intestine without perforation or abscess without bleeding: Secondary | ICD-10-CM | POA: Diagnosis present

## 2020-11-02 DIAGNOSIS — I10 Essential (primary) hypertension: Secondary | ICD-10-CM | POA: Diagnosis present

## 2020-11-02 LAB — CBC WITH DIFFERENTIAL/PLATELET
Abs Immature Granulocytes: 0.43 10*3/uL — ABNORMAL HIGH (ref 0.00–0.07)
Basophils Absolute: 0.1 10*3/uL (ref 0.0–0.1)
Basophils Relative: 0 %
Eosinophils Absolute: 0.2 10*3/uL (ref 0.0–0.5)
Eosinophils Relative: 1 %
HCT: 39.7 % (ref 39.0–52.0)
Hemoglobin: 13.3 g/dL (ref 13.0–17.0)
Immature Granulocytes: 3 %
Lymphocytes Relative: 12 %
Lymphs Abs: 2 10*3/uL (ref 0.7–4.0)
MCH: 29.2 pg (ref 26.0–34.0)
MCHC: 33.5 g/dL (ref 30.0–36.0)
MCV: 87.1 fL (ref 80.0–100.0)
Monocytes Absolute: 1.1 10*3/uL — ABNORMAL HIGH (ref 0.1–1.0)
Monocytes Relative: 7 %
Neutro Abs: 13.6 10*3/uL — ABNORMAL HIGH (ref 1.7–7.7)
Neutrophils Relative %: 77 %
Platelets: 208 10*3/uL (ref 150–400)
RBC: 4.56 MIL/uL (ref 4.22–5.81)
RDW: 14 % (ref 11.5–15.5)
WBC: 17.4 10*3/uL — ABNORMAL HIGH (ref 4.0–10.5)
nRBC: 0 % (ref 0.0–0.2)

## 2020-11-02 LAB — BASIC METABOLIC PANEL
Anion gap: 9 (ref 5–15)
BUN: 10 mg/dL (ref 6–20)
CO2: 26 mmol/L (ref 22–32)
Calcium: 9.6 mg/dL (ref 8.9–10.3)
Chloride: 106 mmol/L (ref 98–111)
Creatinine, Ser: 0.97 mg/dL (ref 0.61–1.24)
GFR, Estimated: >60 mL/min (ref >60)
Glucose, Bld: 111 mg/dL — ABNORMAL HIGH (ref 70–99)
Potassium: 3.7 mmol/L (ref 3.5–5.1)
Sodium: 141 mmol/L (ref 135–145)

## 2020-11-02 LAB — HEPATIC FUNCTION PANEL
ALT: 90 U/L — ABNORMAL HIGH (ref 0–44)
AST: 29 U/L (ref 15–41)
Albumin: 3.3 g/dL — ABNORMAL LOW (ref 3.5–5.0)
Alkaline Phosphatase: 178 U/L — ABNORMAL HIGH (ref 38–126)
Bilirubin, Direct: 0.9 mg/dL — ABNORMAL HIGH (ref 0.0–0.2)
Indirect Bilirubin: 1.6 mg/dL — ABNORMAL HIGH (ref 0.3–0.9)
Total Bilirubin: 2.5 mg/dL — ABNORMAL HIGH (ref 0.3–1.2)
Total Protein: 7.1 g/dL (ref 6.5–8.1)

## 2020-11-02 LAB — RESP PANEL BY RT-PCR (FLU A&B, COVID) ARPGX2
Influenza A by PCR: NEGATIVE
Influenza B by PCR: NEGATIVE
SARS Coronavirus 2 by RT PCR: NEGATIVE

## 2020-11-02 LAB — HIV ANTIBODY (ROUTINE TESTING W REFLEX): HIV Screen 4th Generation wRfx: NONREACTIVE

## 2020-11-02 LAB — CBG MONITORING, ED: Glucose-Capillary: 121 mg/dL — ABNORMAL HIGH (ref 70–99)

## 2020-11-02 LAB — TRIGLYCERIDES: Triglycerides: 221 mg/dL — ABNORMAL HIGH (ref <150)

## 2020-11-02 IMAGING — MR MR MRCP
9 of 15 series · 39 of 48 positions shown · non-contrast
Comparison: CT on [DATE]

CLINICAL DATA: Worsening right upper quadrant and epigastric
abdominal pain. Cholelithiasis. Acute pancreatitis.

EXAM:
MRI ABDOMEN WITHOUT CONTRAST  (INCLUDING MRCP)
TECHNIQUE: Multiplanar multisequence MR imaging of the abdomen was performed.
Heavily T2-weighted images of the biliary and pancreatic ducts were
obtained, and three-dimensional MRCP images were rendered by post
processing.

[Series 2: DWI · axial · 6.0mm · 1.84mm/px · z∈[-89,+178]mm · 5 of 76 slices shown (1 of 2)]
[im 1/76]
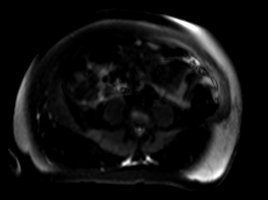
[im 19/76]
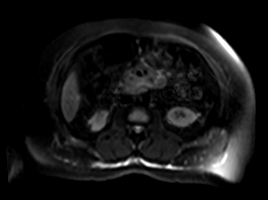
[im 38/76]
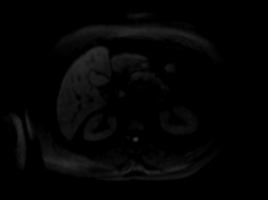
[im 57/76]
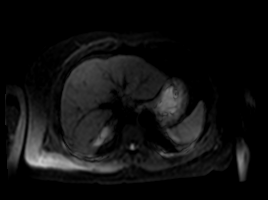
[im 76/76]
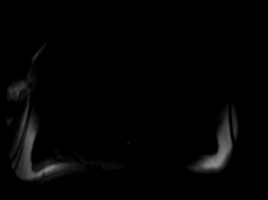

[Series 3: DWI · axial · 6.0mm · 1.84mm/px · z∈[-89,+178]mm · 2 of 38 slices shown (2 of 2)]
[im 1/38]
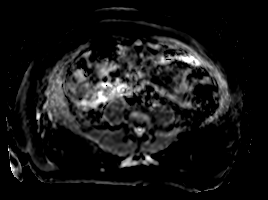
[im 38/38]
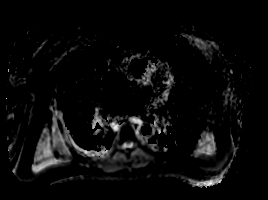

[Series 5: T2 fat-sat · axial · 6.0mm · 1.34mm/px · z∈[-86,+195]mm · 3 of 40 slices shown]
[im 1/40]
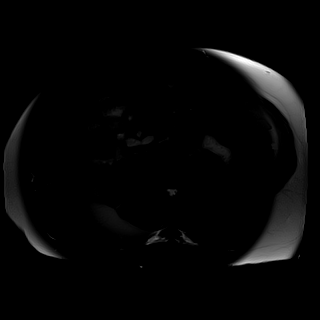
[im 20/40]
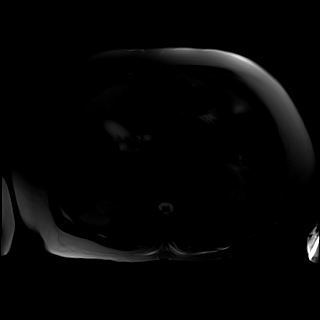
[im 40/40]
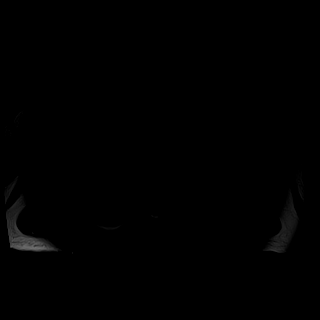

[Series 8: T2 · coronal · 6.0mm · 1.48mm/px · 4 of 50 slices shown (1 of 2)]
[im 1/50]
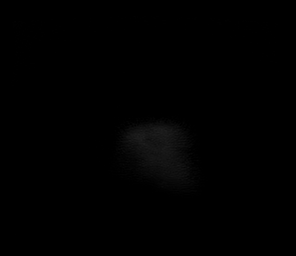
[im 17/50]
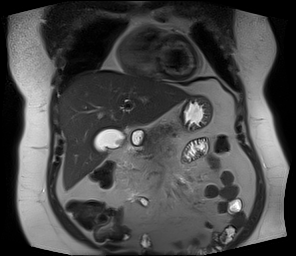
[im 33/50]
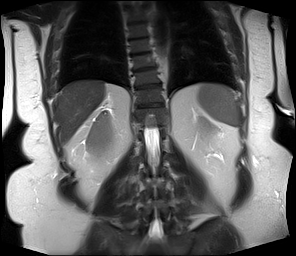
[im 50/50]
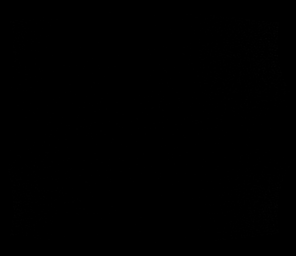

[Series 9: T1 · axial · 3.0mm · 1.56mm/px · z∈[-96,+165]mm · 7 of 88 slices shown (1 of 2)]
[im 1/88]
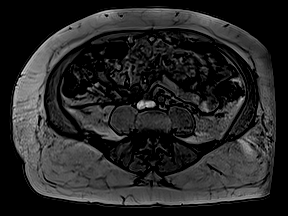
[im 15/88]
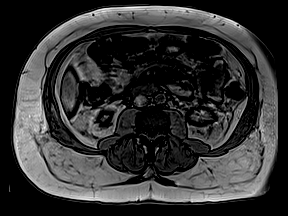
[im 30/88]
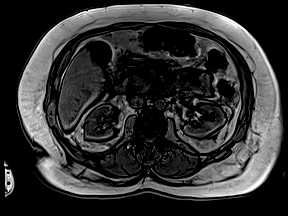
[im 44/88]
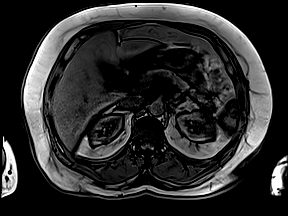
[im 59/88]
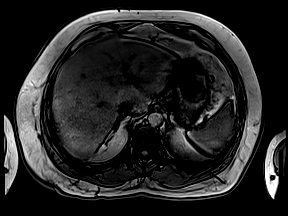
[im 73/88]
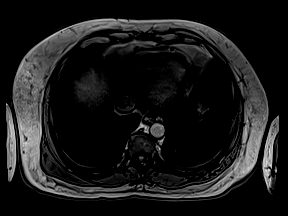
[im 88/88]
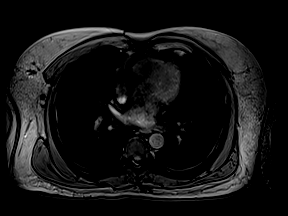

[Series 10: T1 · axial · 3.0mm · 1.56mm/px · z∈[-96,+165]mm · 7 of 88 slices shown (2 of 2)]
[im 1/88]
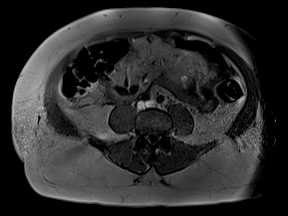
[im 15/88]
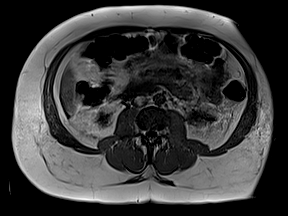
[im 30/88]
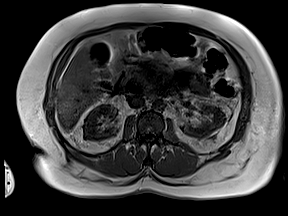
[im 44/88]
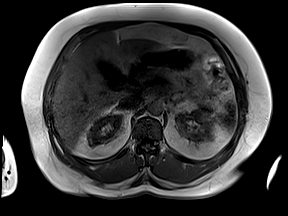
[im 59/88]
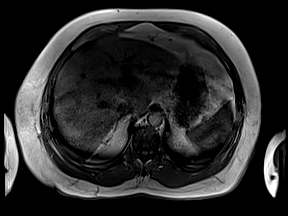
[im 73/88]
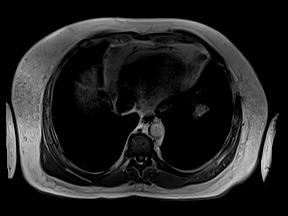
[im 88/88]
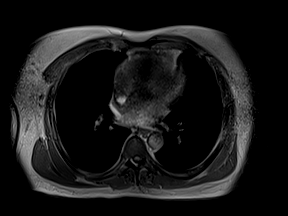

[Series 11: cor obl thk · sagittal · 50.0mm · 0.78mm/px · 1 of 9 slices shown]
[im 1/9]
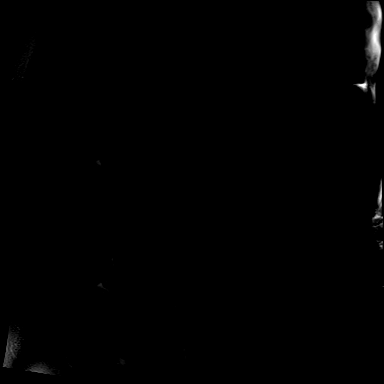

[Series 12: T2 · axial · 6.0mm · 1.76mm/px · z∈[-108,+172]mm · 3 of 40 slices shown (2 of 2)]
[im 1/40]
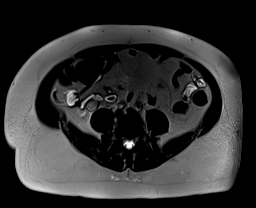
[im 20/40]
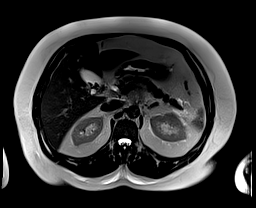
[im 40/40]
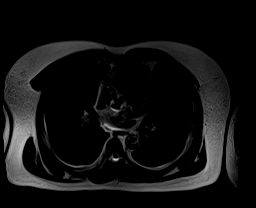

[Series 16: T1 dynamic · axial · 3.0mm · 1.41mm/px · z∈[-106,+155]mm · 7 of 88 slices shown]
[im 1/88]
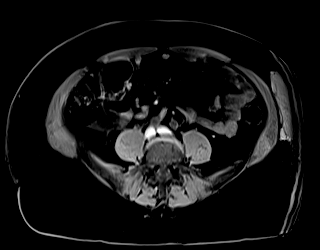
[im 15/88]
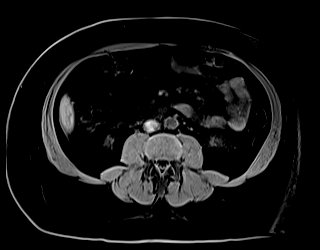
[im 30/88]
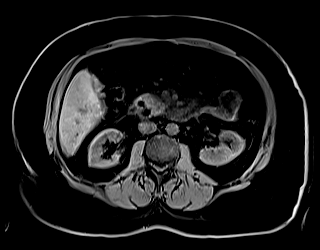
[im 44/88]
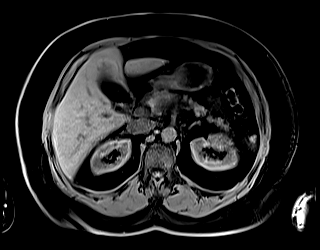
[im 59/88]
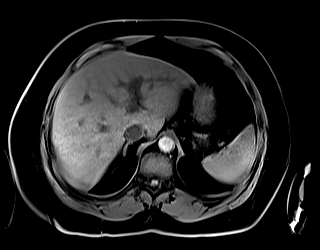
[im 73/88]
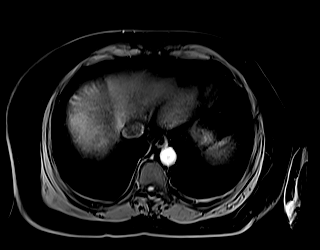
[im 88/88]
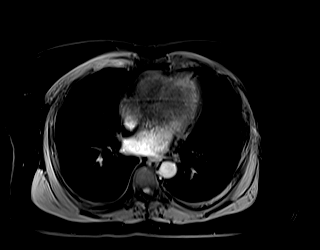

[39 of 48 positions shown; findings below may reference images not displayed]

FINDINGS: Lower chest: No acute findings.

Hepatobiliary: No masses visualized on this unenhanced exam.
Multiple gallstones are seen, largest measuring 2.4 cm. No evidence
of gallbladder wall thickening or pericholecystic inflammatory
changes. Borderline dilatation of common bile duct is seen measuring
up to 6 mm. A few tiny calculi are seen in the distal common bile
duct on images 24 - 28/series 12.

Pancreas: Moderate edema is seen involving the pancreatic head and
body with adjacent peripancreatic inflammatory changes, consistent
with acute pancreatitis. Minimal peripancreatic fluid is seen in the
retroperitoneum, however there is no evidence of pseudocyst.

Spleen:  Within normal limits in size.

Adrenals/Urinary tract: Unremarkable. No evidence of nephrolithiasis
or hydronephrosis.

Stomach/Bowel: Visualized portion unremarkable.

Vascular/Lymphatic: No pathologically enlarged lymph nodes
identified. No evidence of abdominal aortic aneurysm.

Other:  None.

Musculoskeletal:  No suspicious bone lesions identified.
IMPRESSION: Moderate acute pancreatitis. No evidence of pseudocyst.

Borderline dilatation of common bile duct, with tiny calculi in the
distal common bile duct.

Cholelithiasis. No radiographic evidence of cholecystitis.

## 2020-11-02 IMAGING — US US ABDOMEN LIMITED
2 series · 15 of 25 positions shown · non-contrast
Comparison: CT from [DATE]

CLINICAL DATA: Thickened gallbladder walls on recent CT examination

EXAM:
ULTRASOUND ABDOMEN LIMITED RIGHT UPPER QUADRANT

[Series 1: us abdomen limited ruq mc & wl · 10 of 34 slices shown (1 of 2)]
[im 1/34]
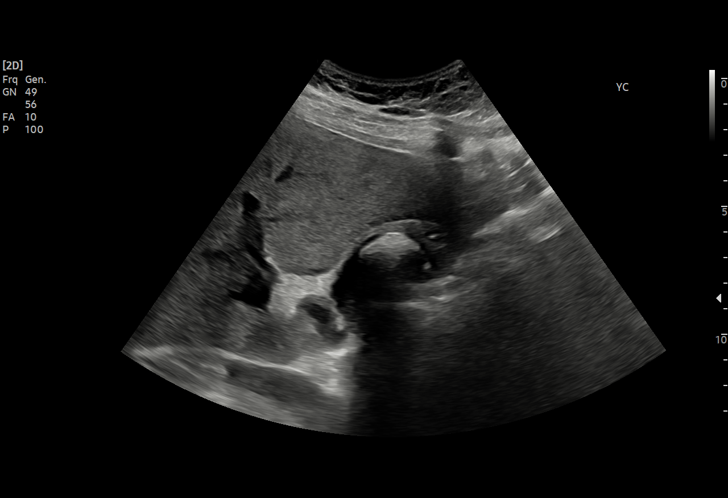
[im 5/34]
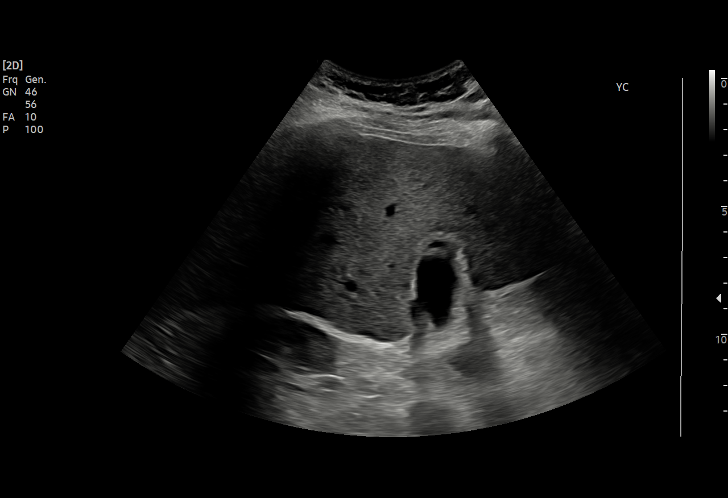
[im 9/34]
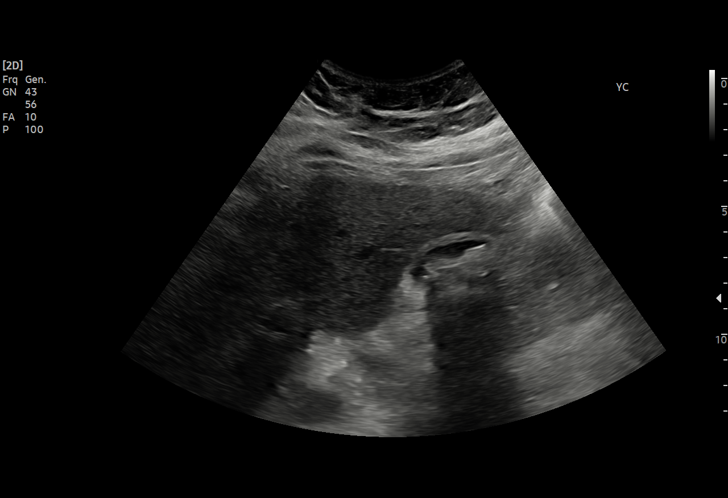
[im 11/34]
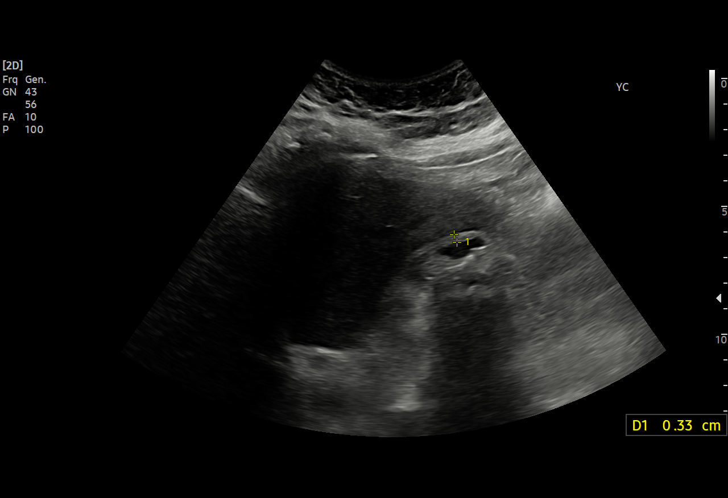
[im 15/34]
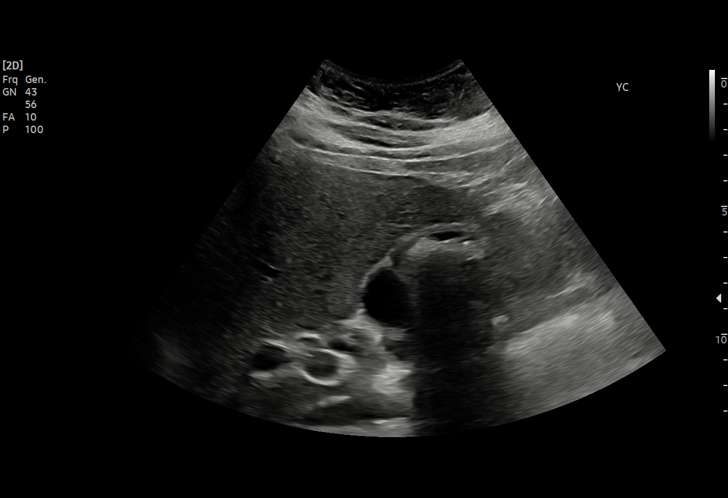
[im 19/34]
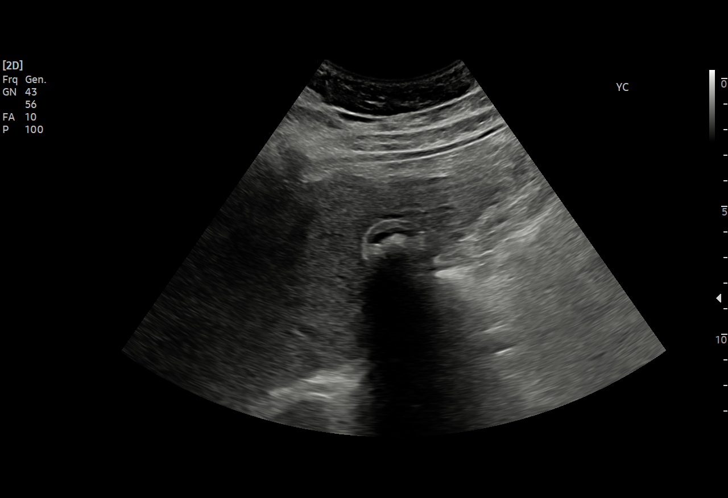
[im 21/34]
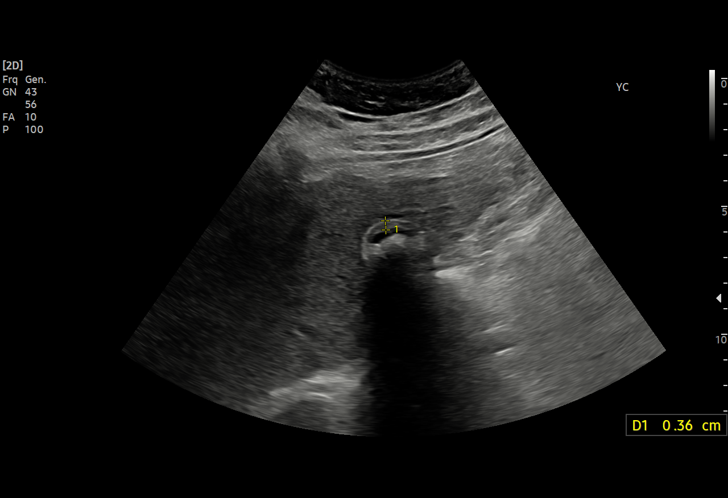
[im 25/34]
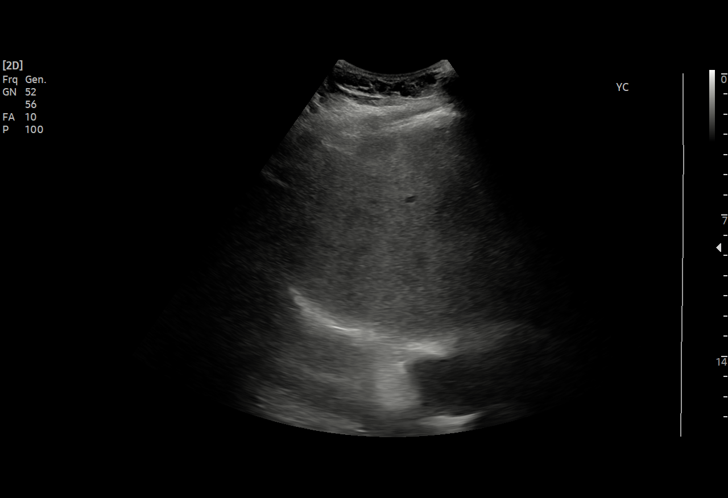
[im 29/34]
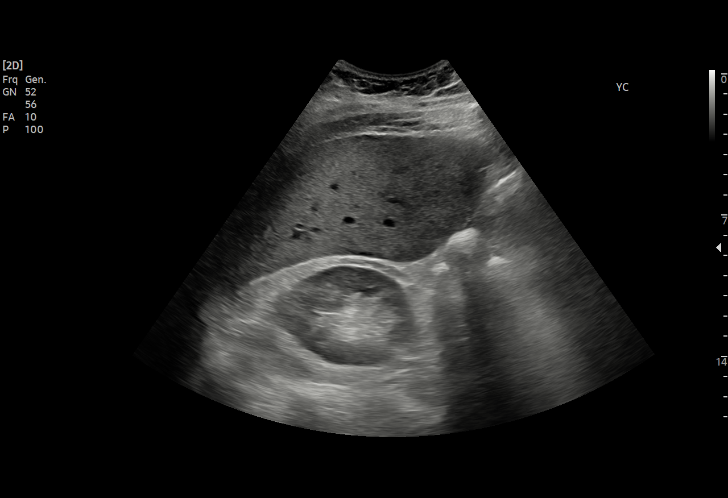
[im 31/34]
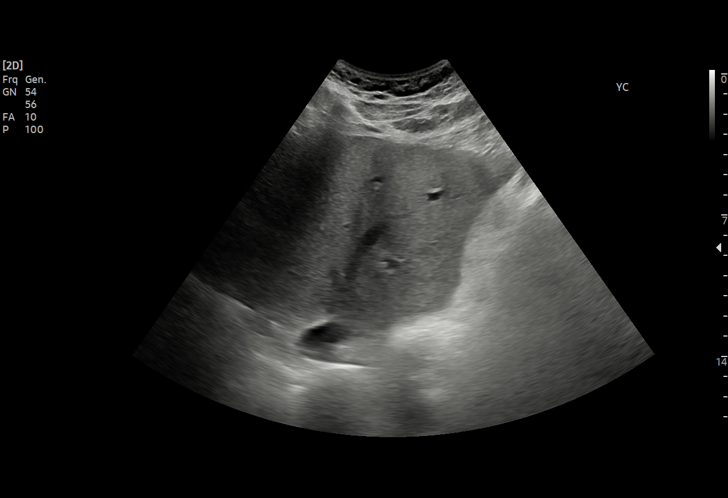

[Series 2: us abdomen limited ruq mc & wl · 5 of 16 slices shown (2 of 2)]
[im 1/16]
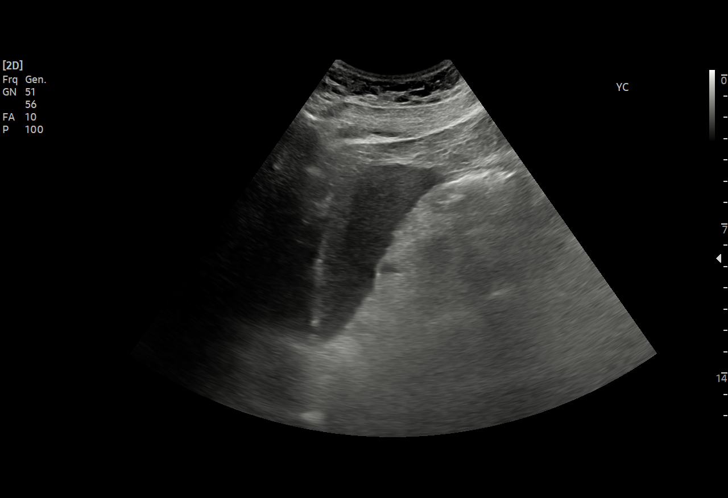
[im 5/16]
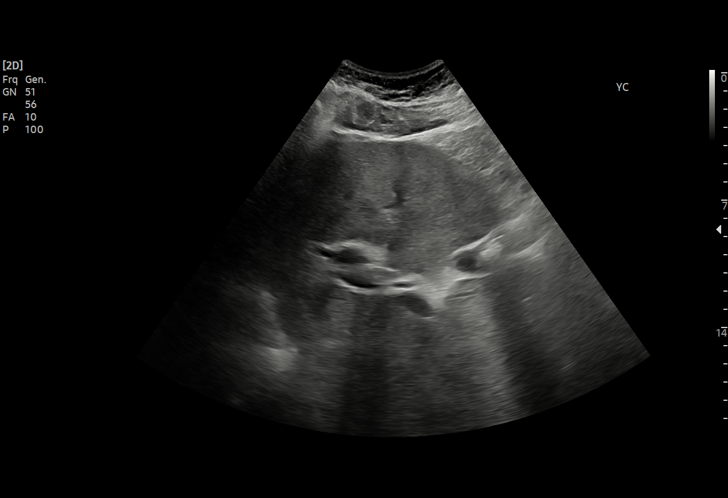
[im 7/16]
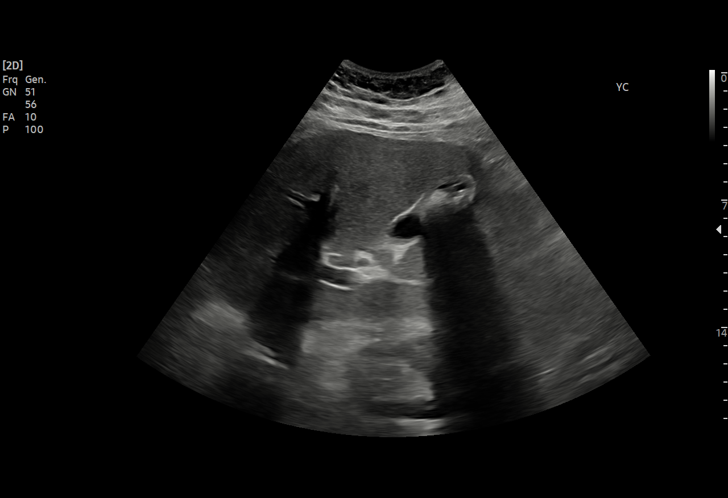
[im 11/16]
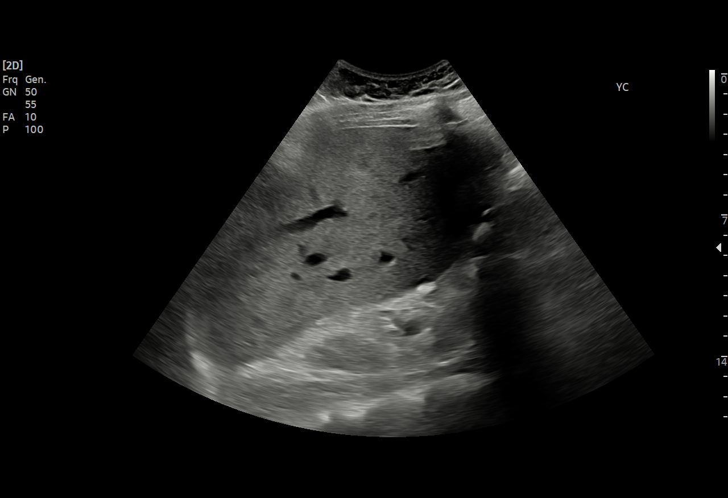
[im 16/16]
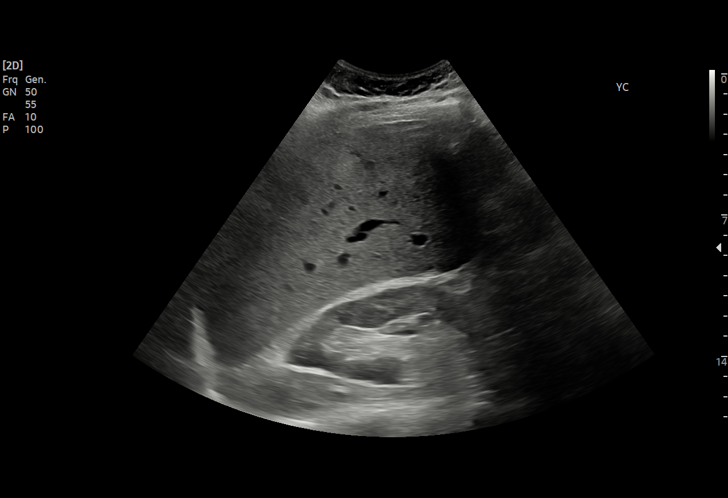

[15 of 25 positions shown; findings below may reference images not displayed]

FINDINGS: Gallbladder:

Gallbladder is well distended with evidence of cholelithiasis.
Negative sonographic Murphy's sign is elicited. Mild wall thickening
is noted likely related to incomplete distension.

Common bile duct:

Diameter: 3.7 mm.

Liver:

No focal lesion identified. Within normal limits in parenchymal
echogenicity. Portal vein is patent on color Doppler imaging with
normal direction of blood flow towards the liver.

Other: None.
IMPRESSION: Cholelithiasis without definitive complicating factors. Negative
sonographic Murphy's sign was elicited.

## 2020-11-02 IMAGING — MR MR 3D RECON AT SCANNER
9 of 14 series · 40 of 48 positions shown · non-contrast
Comparison: CT on [DATE]

CLINICAL DATA: Worsening right upper quadrant and epigastric
abdominal pain. Cholelithiasis. Acute pancreatitis.

EXAM:
MRI ABDOMEN WITHOUT CONTRAST  (INCLUDING MRCP)
TECHNIQUE: Multiplanar multisequence MR imaging of the abdomen was performed.
Heavily T2-weighted images of the biliary and pancreatic ducts were
obtained, and three-dimensional MRCP images were rendered by post
processing.

[Series 2: DWI · axial · 6.0mm · 1.84mm/px · z∈[-89,+178]mm · 5 of 76 slices shown (1 of 2)]
[im 1/76]
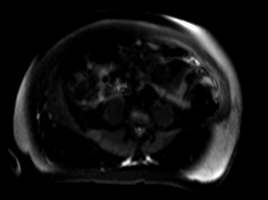
[im 19/76]
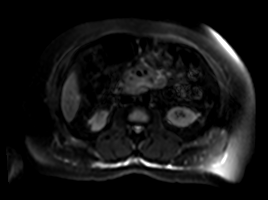
[im 38/76]
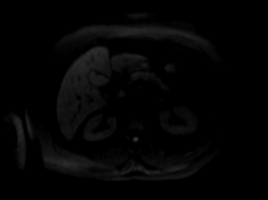
[im 57/76]
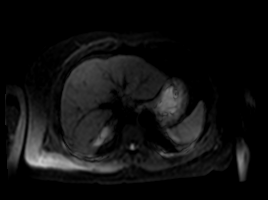
[im 76/76]
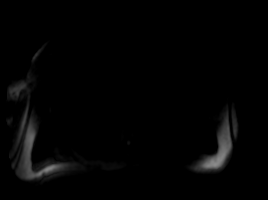

[Series 3: DWI · axial · 6.0mm · 1.84mm/px · z∈[-89,+178]mm · 3 of 38 slices shown (2 of 2)]
[im 1/38]
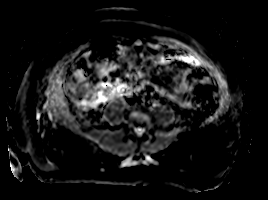
[im 19/38]
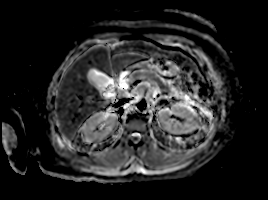
[im 38/38]
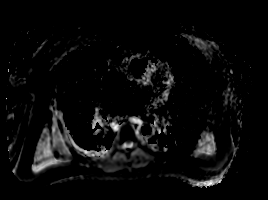

[Series 5: T2 fat-sat · axial · 6.0mm · 1.34mm/px · z∈[-86,+195]mm · 3 of 40 slices shown]
[im 1/40]
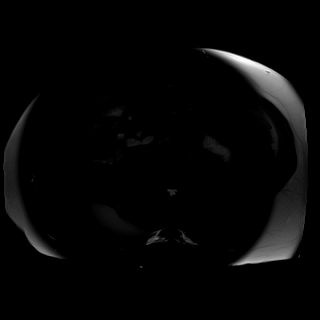
[im 20/40]
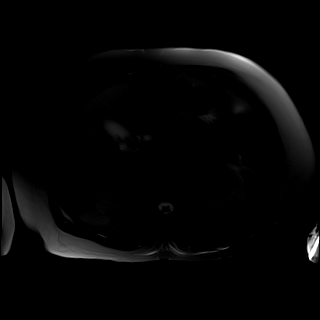
[im 40/40]
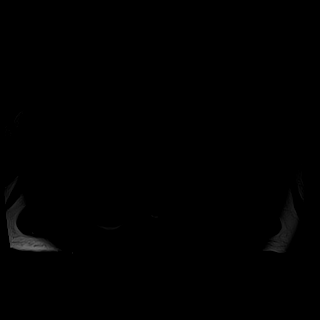

[Series 8: T2 · coronal · 6.0mm · 1.48mm/px · 4 of 50 slices shown (1 of 2)]
[im 1/50]
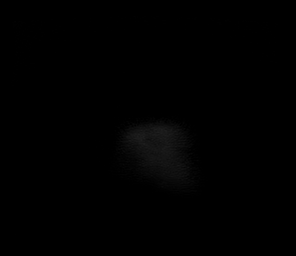
[im 17/50]
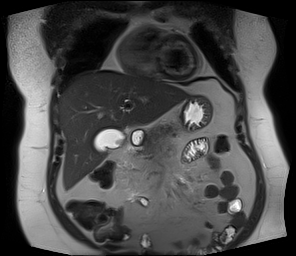
[im 33/50]
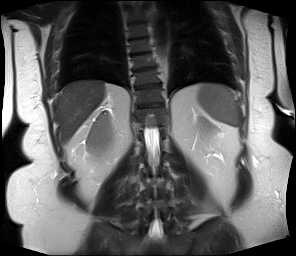
[im 50/50]
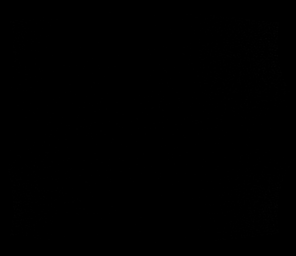

[Series 9: T1 · axial · 3.0mm · 1.56mm/px · z∈[-96,+165]mm · 7 of 88 slices shown (1 of 2)]
[im 1/88]
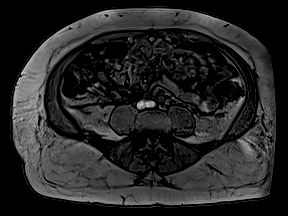
[im 15/88]
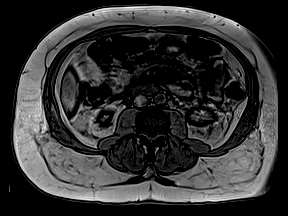
[im 30/88]
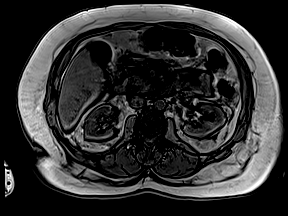
[im 44/88]
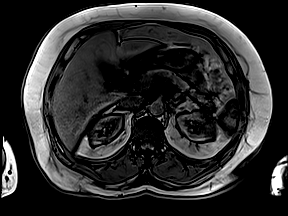
[im 59/88]
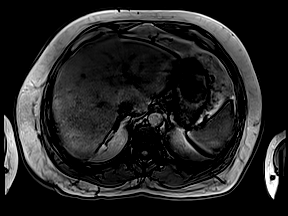
[im 73/88]
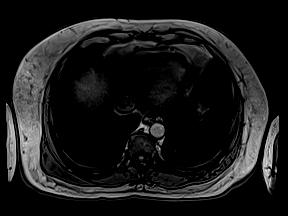
[im 88/88]
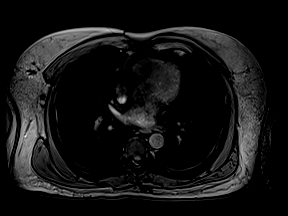

[Series 10: T1 · axial · 3.0mm · 1.56mm/px · z∈[-96,+165]mm · 7 of 88 slices shown (2 of 2)]
[im 1/88]
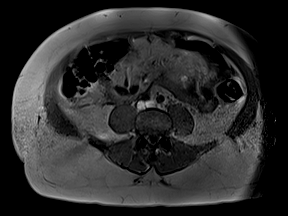
[im 15/88]
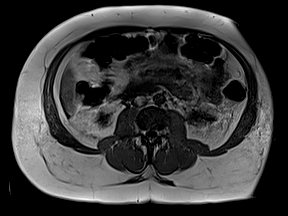
[im 30/88]
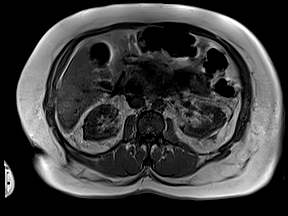
[im 44/88]
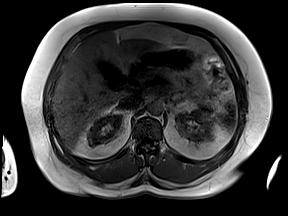
[im 59/88]
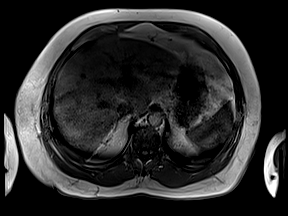
[im 73/88]
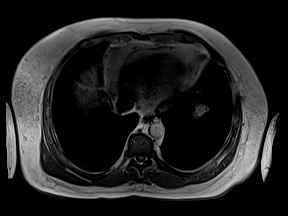
[im 88/88]
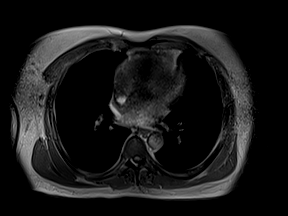

[Series 11: cor obl thk · sagittal · 50.0mm · 0.78mm/px · 1 of 9 slices shown]
[im 1/9]
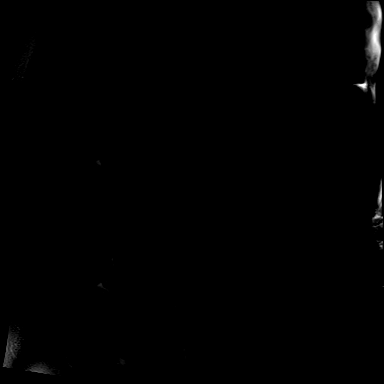

[Series 12: T2 · axial · 6.0mm · 1.76mm/px · z∈[-108,+172]mm · 3 of 40 slices shown (2 of 2)]
[im 1/40]
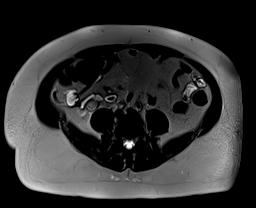
[im 20/40]
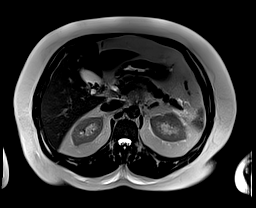
[im 40/40]
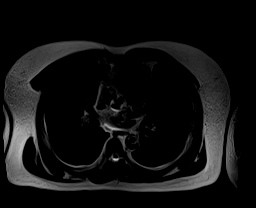

[Series 16: T1 dynamic · axial · 3.0mm · 1.41mm/px · z∈[-106,+155]mm · 7 of 88 slices shown]
[im 1/88]
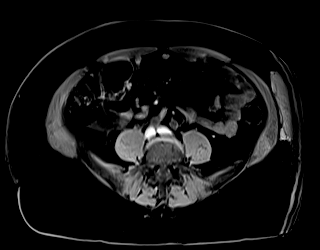
[im 15/88]
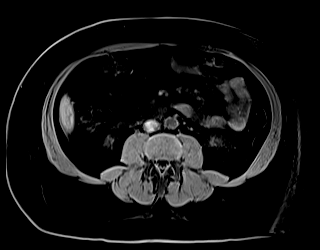
[im 30/88]
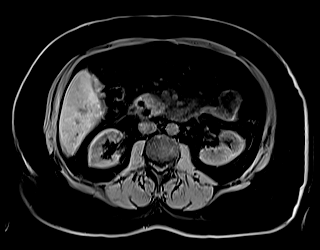
[im 44/88]
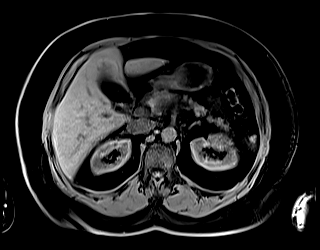
[im 59/88]
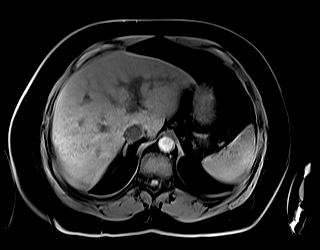
[im 73/88]
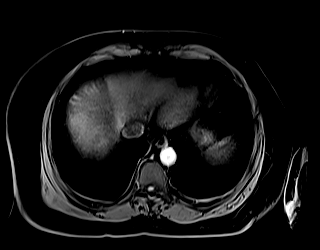
[im 88/88]
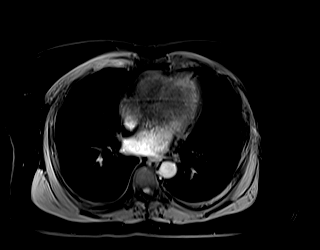

[40 of 48 positions shown; findings below may reference images not displayed]

FINDINGS: Lower chest: No acute findings.

Hepatobiliary: No masses visualized on this unenhanced exam.
Multiple gallstones are seen, largest measuring 2.4 cm. No evidence
of gallbladder wall thickening or pericholecystic inflammatory
changes. Borderline dilatation of common bile duct is seen measuring
up to 6 mm. A few tiny calculi are seen in the distal common bile
duct on images 24 - 28/series 12.

Pancreas: Moderate edema is seen involving the pancreatic head and
body with adjacent peripancreatic inflammatory changes, consistent
with acute pancreatitis. Minimal peripancreatic fluid is seen in the
retroperitoneum, however there is no evidence of pseudocyst.

Spleen:  Within normal limits in size.

Adrenals/Urinary tract: Unremarkable. No evidence of nephrolithiasis
or hydronephrosis.

Stomach/Bowel: Visualized portion unremarkable.

Vascular/Lymphatic: No pathologically enlarged lymph nodes
identified. No evidence of abdominal aortic aneurysm.

Other:  None.

Musculoskeletal:  No suspicious bone lesions identified.
IMPRESSION: Moderate acute pancreatitis. No evidence of pseudocyst.

Borderline dilatation of common bile duct, with tiny calculi in the
distal common bile duct.

Cholelithiasis. No radiographic evidence of cholecystitis.

## 2020-11-02 MED ORDER — LABETALOL HCL 5 MG/ML IV SOLN
5.0000 mg | INTRAVENOUS | Status: DC | PRN
  Administered 2020-11-02 – 2020-11-03 (×2): 5 mg via INTRAVENOUS
  Filled 2020-11-02 (×4): qty 4

## 2020-11-02 MED ORDER — SODIUM CHLORIDE 0.9 % IV SOLN
INTRAVENOUS | Status: DC | PRN
  Administered 2020-11-02: 250 mL via INTRAVENOUS
  Administered 2020-11-04: 1000 mL via INTRAVENOUS

## 2020-11-02 MED ORDER — LACTATED RINGERS IV BOLUS
1000.0000 mL | Freq: Once | INTRAVENOUS | Status: AC
  Administered 2020-11-02: 1000 mL via INTRAVENOUS

## 2020-11-02 MED ORDER — ONDANSETRON HCL 4 MG/2ML IJ SOLN
4.0000 mg | Freq: Once | INTRAMUSCULAR | Status: AC
  Administered 2020-11-02: 4 mg via INTRAVENOUS
  Filled 2020-11-02: qty 2

## 2020-11-02 MED ORDER — LACTATED RINGERS IV SOLN: INTRAVENOUS | Status: AC

## 2020-11-02 MED ORDER — PIPERACILLIN-TAZOBACTAM 3.375 G IVPB
3.3750 g | Freq: Three times a day (TID) | INTRAVENOUS | Status: DC
  Administered 2020-11-02 – 2020-11-07 (×15): 3.375 g via INTRAVENOUS
  Filled 2020-11-02 (×16): qty 50

## 2020-11-02 MED ORDER — PIPERACILLIN-TAZOBACTAM 3.375 G IVPB 30 MIN
3.3750 g | Freq: Once | INTRAVENOUS | Status: AC
  Administered 2020-11-02: 3.375 g via INTRAVENOUS
  Filled 2020-11-02: qty 50

## 2020-11-02 MED ORDER — ONDANSETRON HCL 4 MG/2ML IJ SOLN: 4.0000 mg | Freq: Four times a day (QID) | INTRAMUSCULAR | Status: DC | PRN

## 2020-11-02 MED ORDER — HYDROMORPHONE HCL 1 MG/ML IJ SOLN
1.0000 mg | Freq: Once | INTRAMUSCULAR | Status: AC
  Administered 2020-11-02: 1 mg via INTRAVENOUS
  Filled 2020-11-02: qty 1

## 2020-11-02 MED ORDER — HYDROMORPHONE HCL 1 MG/ML IJ SOLN
0.5000 mg | INTRAMUSCULAR | Status: DC | PRN
Start: 2020-11-02 — End: 2020-11-08
  Administered 2020-11-02 – 2020-11-05 (×2): 1 mg via INTRAVENOUS
  Administered 2020-11-05 – 2020-11-06 (×2): 0.5 mg via INTRAVENOUS
  Filled 2020-11-02 (×4): qty 1

## 2020-11-02 MED ORDER — SODIUM CHLORIDE 0.9 % IV SOLN: 2.0000 g | Freq: Once | INTRAVENOUS | Status: DC

## 2020-11-02 MED ORDER — ACETAMINOPHEN 325 MG PO TABS
650.0000 mg | ORAL_TABLET | Freq: Four times a day (QID) | ORAL | Status: DC | PRN
  Administered 2020-11-02: 650 mg via ORAL
  Filled 2020-11-02: qty 2

## 2020-11-02 NOTE — Progress Notes (Signed)
Pharmacy Antibiotic Note  Sean Valencia is a 60 y.o. adult admitted on 11/01/2020 with intra-abdominal infection.  Pharmacy has been consulted for Zosyn dosing.  Plan: Zosyn 3.375g IV q8h (4 hour infusion). Need for further dosage adjustment appears unlikely at present.    Will sign off at this time.  Please reconsult if a change in clinical status warrants re-evaluation of dosage.   Recent Labs  Lab 10/31/20 1654 11/01/20 1952  WBC 16.8* 20.5*  CREATININE 0.95 0.93    Estimated Creatinine Clearance (by C-G formula based on SCr of 0.93 mg/dL) Male: 91.4 mL/min Male: 110.8 mL/min    No Known Allergies   Thank you for allowing pharmacy to be a part of this patient's care.  Everette Rank, PharmD 11/02/2020 5:41 AM

## 2020-11-02 NOTE — ED Provider Notes (Signed)
[facility] DEPT Provider Note   CSN: [id number] Arrival date & time: 11/01/20  1849     History Chief Complaint  Patient presents with   Abdominal Pain   Hematuria    Sean Valencia is a 60 y.o. adult.  Seen here yesterday and had findings of pancreatitis on his CT scan but no evidence of gallbladder disease.  Did have elevated bilirubin and liver enzymes yesterday.  Also had elevated white blood cell count.  Notably his lipase was only mildly elevated.  Ultrasound of his gallbladder yesterday showed cholelithiasis but no gallbladder Murphy sign or pericholecystic fluid.  Patient was advised to be admitted but left AGAINST MEDICAL ADVICE.  Patient states the pain is little bit better than yesterday.  No vomiting, diarrhea or new symptoms.   Abdominal Pain Pain location:  Epigastric and LUQ Pain quality: aching, gnawing and sharp   Pain radiates to:  Does not radiate Pain severity:  Moderate Onset quality:  Gradual Duration:  5 days Timing:  Constant Progression:  Improving Chronicity:  New Context: not alcohol use, not diet changes and not sick contacts   Relieved by:  None tried Worsened by:  Nothing Ineffective treatments:  None tried Associated symptoms: hematuria   Associated symptoms: no anorexia, no dysuria, no melena, no nausea and no shortness of breath   Hematuria Associated symptoms include abdominal pain. Pertinent negatives include no shortness of breath.      Past Medical History:  Diagnosis Date   Allergy    Anal fistula    Arthritis    History of kidney stones    HTN (hypertension)     Patient Active Problem List   Diagnosis Date Noted   Acute pancreatitis 11/02/2020    Past Surgical History:  Procedure Laterality Date   INGUINAL HERNIA REPAIR Bilateral 1992     OB History   No obstetric history on file.     Family History  Problem Relation Age of Onset   Diabetes Father    Hypertension Father    Bone  cancer Maternal Grandfather    Diabetes Paternal Grandmother    Diabetes Paternal Grandfather    Leukemia Paternal Grandfather    Hypercalcemia Brother    Colon cancer Neg Hx    Esophageal cancer Neg Hx    Stomach cancer Neg Hx    Rectal cancer Neg Hx     Social History   Tobacco Use   Smoking status: Never   Smokeless tobacco: Never  Vaping Use   Vaping Use: Never used  Substance Use Topics   Alcohol use: Yes    Comment: 4 per year   Drug use: No    Home Medications Prior to Admission medications   Medication Sig Start Date End Date Taking? Authorizing Provider  loratadine-pseudoephedrine (CLARITIN-D 24-HOUR) 10-240 MG per 24 hr tablet Take 1 tablet by mouth daily.    [provider]  triamcinolone (NASACORT) 55 MCG/ACT AERO nasal inhaler Place 2 sprays into the nose daily.    [provider]    Allergies    Patient has no known allergies.  Review of Systems   Review of Systems  Respiratory:  Negative for shortness of breath.   Gastrointestinal:  Positive for abdominal pain. Negative for anorexia, melena and nausea.  Genitourinary:  Positive for hematuria. Negative for dysuria.  All other systems reviewed and are negative.  Physical Exam Updated Vital Signs BP (!) 152/91   Pulse 71   Temp 98.2 F (36.8  C) (Oral)   Resp 17   SpO2 95%   Physical Exam Vitals and nursing note reviewed.  Constitutional:      Appearance: He is well-developed.  HENT:     Head: Normocephalic and atraumatic.     Mouth/Throat:     Mouth: Mucous membranes are moist.     Pharynx: Oropharynx is clear.  Eyes:     Pupils: Pupils are equal, round, and reactive to light.  Cardiovascular:     Rate and Rhythm: Normal rate.  Pulmonary:     Effort: Pulmonary effort is normal. No respiratory distress.  Abdominal:     General: Abdomen is flat. There is no distension.  Musculoskeletal:        General: Normal range of motion.     Cervical back: Normal range of motion.   Skin:    General: Skin is warm and dry.  Neurological:     General: No focal deficit present.     Mental Status: He is alert.    ED Results / Procedures / Treatments   Labs (all labs ordered are listed, but only abnormal results are displayed) Labs Reviewed  CBC WITH DIFFERENTIAL/PLATELET - Abnormal; Notable for the following components:      Result Value   WBC 20.5 (*)    Neutro Abs 17.4 (*)    Monocytes Absolute 1.2 (*)    Abs Immature Granulocytes 0.34 (*)    All other components within normal limits  COMPREHENSIVE METABOLIC PANEL - Abnormal; Notable for the following components:   Glucose, Bld 149 (*)    ALT 119 (*)    Alkaline Phosphatase 214 (*)    Total Bilirubin 2.7 (*)    All other components within normal limits  URINALYSIS, ROUTINE W REFLEX MICROSCOPIC - Abnormal; Notable for the following components:   Color, Urine AMBER (*)    Ketones, ur 20 (*)    Protein, ur 100 (*)    Bacteria, UA RARE (*)    All other components within normal limits  LIPASE, BLOOD    EKG None  Radiology CT Abdomen Pelvis W Contrast  Result Date: 10/31/2020 CLINICAL DATA:  Abdominal distension, abdominal pain. EXAM: CT ABDOMEN AND PELVIS WITH CONTRAST TECHNIQUE: Multidetector CT imaging of the abdomen and pelvis was performed using the standard protocol following bolus administration of intravenous contrast. CONTRAST:  54m OMNIPAQUE IOHEXOL 350 MG/ML SOLN COMPARISON:  None. FINDINGS: Lower chest: Mild bibasilar scarring/atelectasis. Hepatobiliary: No focal liver abnormality is seen. Gallbladder walls appear thickened but there is no pericholecystic fluid seen. Pancreas: Ill-defined fluid/edema about the pancreatic head/proximal body. Pancreatic tail appears normal. Spleen: Normal in size without focal abnormality. Adrenals/Urinary Tract: Adrenal glands appear normal. Kidneys are unremarkable without mass, stone or hydronephrosis. No ureteral or bladder calculi are identified. Bladder is  unremarkable, decompressed. Stomach/Bowel: No dilated large or small bowel loops. Appendix is normal. Diverticulosis within the upper sigmoid colon but no focal inflammatory change to suggest acute diverticulitis. Probable reactive thickening of the walls of the duodenum, given the proximity to the peripancreatic inflammation. Stomach is unremarkable, partially decompressed. Vascular/Lymphatic: Mild aortic atherosclerosis. No acute-appearing vascular abnormality. No enlarged lymph nodes seen in the abdomen or pelvis. Reproductive: Prostate is unremarkable. Other: No active hemorrhage or abscess collection. No free intraperitoneal air. Musculoskeletal: No acute or suspicious osseous abnormality. Small RIGHT inguinal hernia which contains fat only. IMPRESSION: 1. Findings are consistent with acute pancreatitis. No evidence of pancreatic necrosis. 2. Gallbladder walls appear thickened but there is no pericholecystic fluid seen,  likely reactive to the adjacent pancreatitis. 3. Similarly, suspect reactive thickening of the walls of the duodenum given the adjacent peripancreatic inflammation. 4. Colonic diverticulosis without evidence of acute diverticulitis. 5. Small RIGHT inguinal hernia which contains fat only. Aortic Atherosclerosis (ICD10-I70.0). Electronically Signed   By: Franki Cabot M.D.   On: 10/31/2020 20:11   US Abdomen Limited RUQ (LIVER/GB)  Result Date: 11/02/2020 CLINICAL DATA:  Thickened gallbladder walls on recent CT examination EXAM: ULTRASOUND ABDOMEN LIMITED RIGHT UPPER QUADRANT COMPARISON:  CT from 10/31/2020 FINDINGS: Gallbladder: Gallbladder is well distended with evidence of cholelithiasis. Negative sonographic Murphy's sign is elicited. Mild wall thickening is noted likely related to incomplete distension. Common bile duct: Diameter: 3.7 mm. Liver: No focal lesion identified. Within normal limits in parenchymal echogenicity. Portal vein is patent on color Doppler imaging with normal  direction of blood flow towards the liver. Other: None. IMPRESSION: Cholelithiasis without definitive complicating factors. Negative sonographic Murphy's sign was elicited. Electronically Signed   By: Inez Catalina M.D.   On: 11/02/2020 01:34   US Abdomen Limited RUQ (LIVER/GB)  Result Date: 10/31/2020 CLINICAL DATA:  Right upper quadrant pain for 5 days. EXAM: ULTRASOUND ABDOMEN LIMITED RIGHT UPPER QUADRANT COMPARISON:  None. FINDINGS: Gallbladder: Physiologically distended. Shadowing intraluminal gallstone measuring 2.3 cm. Gallbladder wall thickness is upper normal at 2 to 3 mm. No pericholecystic fluid. No sonographic Murphy sign noted by sonographer. Common bile duct: Diameter: 5 mm, normal Liver: No focal lesion identified. Within normal limits in parenchymal echogenicity. Portal vein is patent on color Doppler imaging with normal direction of blood flow towards the liver. Other: No right upper quadrant ascites. IMPRESSION: 1. Gallstone without sonographic findings of acute cholecystitis. 2. No biliary dilatation. Electronically Signed   By: Keith Rake M.D.   On: 10/31/2020 19:57    Procedures Procedures   Medications Ordered in ED Medications  lactated ringers bolus 1,000 mL (has no administration in time range)  piperacillin-tazobactam (ZOSYN) IVPB 3.375 g (3.375 g Intravenous New Bag/Given 11/02/20 0334)  lactated ringers infusion (has no administration in time range)  HYDROmorphone (DILAUDID) injection 1 mg (1 mg Intravenous Given 11/02/20 0120)  lactated ringers bolus 1,000 mL (0 mLs Intravenous Stopped 11/02/20 0323)  ondansetron (ZOFRAN) injection 4 mg (4 mg Intravenous Given 11/02/20 0120)    ED Course  I have reviewed the triage vital signs and the nursing notes.  Pertinent labs & imaging results that were available during my care of the patient were reviewed by me and considered in my medical decision making (see chart for details).    MDM Rules/Calculators/A&P                          Leukocytosis is slightly worse than yesterday but his liver enzymes are improving.  His lipase is normal today.  Repeat ultrasound does not show any changes as far as the gallbladder was concerned.  Since his pains improving I will discuss with GI to see if they think the patient would need to be admitted for ERCP or not. D/w Dr. Havery Moros, recommended antibiotics and MRCP. Will see in AM to help with management.  D/w Dr. Hal Hope for admission.   Final Clinical Impression(s) / ED Diagnoses Final diagnoses:  Abdominal pain  Epigastric pain    Rx / DC Orders ED Discharge Orders     None        Gordon Vandunk, Corene Cornea, MD 11/02/20 [id number]

## 2020-11-02 NOTE — Progress Notes (Signed)
Patient admitted after midnight, please see H&P.  Here with abdominal pain. ? Pancreatitis.  Denies alcohol so gall stone pancreatitis suspected.  Await MRCP. Eulogio Bear DO

## 2020-11-02 NOTE — Consult Note (Addendum)
Referring Provider: Dr. Eulogio Bear  Primary Care Physician:  Patient, No Pcp Per (Inactive) Primary Gastroenterologist:  Dr. Silverio Decamp   Reason for Consultation: Pancreatitis, choledocholithiasis  HPI: Sean Valencia is a 60 y.o. adult with a past medical history of arthritis, hypertension, kidney stones, anal fistula, diverticulitis and tubular adenomatous colon polyps.  He presented to [facility] ED on 10/31/2020 due to having central abdominal pain and darker urine x 5 days.  Labs in the ED showed a WBC count 16.8.  Hemoglobin 15.4.  Hematocrit 46.1.  Alk phos 241.  Total bili 3.9.  AST 83.  ALT 181.  Lipase 67.  BUN 11.  Creatinine 0.95.  SARS coronavirus 2 negative.  Urine bilirubin level negative.  Urine hemoglobin negative.  A RUQ sonogram showed evidence of a gallstone measuring 2.3 cm without evidence of acute cholecystitis or biliary ductal dilatation.  CTAP with contrast showed evidence of acute pancreatitis, gallbladder wall appeared thickened likely reactive to adjacent pancreatitis and duodenal wall thickening also thought to be secondary to adjacent peripancreatic inflammation.  Diverticulosis without evidence of diverticulitis and a small right inguinal hernia containing fat was noted.  He developed worsening epigastric pain today and a repeat RUQ sonogram done today was consistent with cholelithiasis without acute cholecystitis.  He underwent an abdominal MRI/MRCP without contrast today which identified moderate acute pancreatitis without evidence of a pseudocyst, borderline dilatation of the CBD with a tiny calculi in the distal CBD.  GI consult was requested for further evaluation for consideration for an ERCP.  He reports having brief episodes of central to epigastric pain which lasted for 1 or 2 days since 2018 for which she takes Tums or Pepto-Bismol with relief.  He denies any prior known history of gallstones.  He generally felt unwell on Tuesday 10/28/2020 then  developed central to epigastric pain which progressively worsened as noted above.  He had nausea x1 episode without vomiting.  His urine was darker in color and he was concerned he had bloody urine with past history of kidney stones.  He denies having any dysphagia or heartburn.  He is passing a normal formed brown bowel movement most days.  No rectal bleeding or black stools.  No NSAID use.  He received steroid injections to his knees in the past by his orthopedist and last week had gel injections to both knees.  No prior history of pancreatitis.  He drinks 1 glass of wine every few months or less.  No drug use.  No fever, sweats or chills.  No weight loss.  He has a history of an anal fistula for which she was referred to general surgery but has not yet scheduled this appointment.  He underwent a colonoscopy by Dr. Silverio Decamp 07/19/2020 which identified 3 tubular adenomatous polyps which were removed from the colon.    Abdominal MRI/MRCP without contrast 11/02/2020: Moderate acute pancreatitis. No evidence of pseudocyst.  Borderline dilatation of common bile duct, with tiny calculi in the distal common bile duct.  Cholelithiasis. No radiographic evidence of cholecystitis.  RUQ sonogram 11/02/2020: Cholelithiasis without definitive complicating factors. Negative sonographic Murphy's sign was elicited.    CT AP with contrast 10/31/2020: 1. Findings are consistent with acute pancreatitis. No evidence of pancreatic necrosis. 2. Gallbladder walls appear thickened but there is no pericholecystic fluid seen, likely reactive to the adjacent pancreatitis. 3. Similarly, suspect reactive thickening of the walls of the duodenum given the adjacent peripancreatic inflammation. 4. Colonic diverticulosis without evidence of acute diverticulitis. 5.  Small RIGHT inguinal hernia which contains fat only.  Aortic Atherosclerosis   RUQ sonogram 10/31/2020: 1. Gallstone without sonographic findings of acute  cholecystitis. 2. No biliary dilatation.  Colonoscopy by Dr. Silverio Decamp 07/19/2020: - Three 4 to 10 mm polyps in the rectum and in the transverse colon, removed with a cold snare. Resected and retrieved. - Moderate diverticulosis in the sigmoid colon. There was narrowing of the colon in association with the diverticular opening. Peri-diverticular erythema was seen. There was evidence of an impacted diverticulum. There was no evidence of diverticular bleeding. - Non-bleeding external and internal hemorrhoids. - 3 year recall  TUBULAR ADENOMA (THREE). - NO HIGH GRADE DYSPLASIA OR CARCINOMA.   Past Medical History:  Diagnosis Date   Allergy    Anal fistula    Arthritis    History of kidney stones    HTN (hypertension)     Past Surgical History:  Procedure Laterality Date   INGUINAL HERNIA REPAIR Bilateral 1992    Prior to Admission medications   Not on File    Current Facility-Administered Medications  Medication Dose Route Frequency Provider Last Rate Last Admin   0.9 %  sodium chloride infusion  500 mL Intravenous Once Nandigam, Kavitha V, MD       HYDROmorphone (DILAUDID) injection 0.5-1 mg  0.5-1 mg Intravenous Q2H PRN Rise Patience, MD   1 mg at 11/02/20 0603   labetalol (NORMODYNE) injection 5 mg  5 mg Intravenous Q2H PRN Rise Patience, MD   5 mg at 11/02/20 2229   lactated ringers infusion   Intravenous Continuous Rise Patience, MD 200 mL/hr at 11/02/20 0551 New Bag at 11/02/20 0551   piperacillin-tazobactam (ZOSYN) IVPB 3.375 g  3.375 g Intravenous Q8H Poindexter, Leann T, RPH 12.5 mL/hr at 11/02/20 1010 3.375 g at 11/02/20 1010   No current outpatient medications on file.    Allergies as of 11/01/2020   (No Known Allergies)    Family History  Problem Relation Age of Onset   Diabetes Father    Hypertension Father    Bone cancer Maternal Grandfather    Diabetes Paternal Grandmother    Diabetes Paternal Grandfather    Leukemia Paternal  Grandfather    Hypercalcemia Brother    Colon cancer Neg Hx    Esophageal cancer Neg Hx    Stomach cancer Neg Hx    Rectal cancer Neg Hx     Social History   Socioeconomic History   Marital status: Married    Spouse name: Not on file   Number of children: 5   Years of education: Not on file   Highest education level: Not on file  Occupational History   Occupation: Pharmacist, hospital  Tobacco Use   Smoking status: Never   Smokeless tobacco: Never  Vaping Use   Vaping Use: Never used  Substance and Sexual Activity   Alcohol use: Yes    Comment: 4 per year   Drug use: No   Sexual activity: Yes    Birth control/protection: None  Other Topics Concern   Not on file  Social History Narrative   Not on file   Social Determinants of Health   Financial Resource Strain: Not on file  Food Insecurity: Not on file  Transportation Needs: Not on file  Physical Activity: Not on file  Stress: Not on file  Social Connections: Not on file  Intimate Partner Violence: Not on file    Review of Systems: Gen: Denies fever, sweats or chills. No weight  loss.  CV: Denies chest pain, palpitations or edema. Resp: Denies cough, shortness of breath of hemoptysis.  GI: See HPI. GU : See HPI. MS: Denies joint pain, muscles aches or weakness. Derm: Denies rash, itchiness, skin lesions or unhealing ulcers. Psych: Denies depression, anxiety or memory loss. Heme: Denies easy bruising, bleeding. Neuro:  Denies headaches, dizziness or paresthesias. Endo:  Denies any problems with DM, thyroid or adrenal function.  Physical Exam: Vital signs in last 24 hours: Temp:  [98.2 F (36.8 C)] 98.2 F (36.8 C) (08/15 1903) Pulse Rate:  [67-95] 67 (08/16 1208) Resp:  [16-24] 19 (08/16 1208) BP: (94-194)/(71-104) 166/82 (08/16 1208) SpO2:  [95 %-100 %] 98 % (08/16 1208)   General: 60 year old male in no acute distress. Head:  Normocephalic and atraumatic. Eyes:  No scleral icterus. Conjunctiva pink. Ears:   Normal auditory acuity. Nose:  No deformity, discharge or lesions. Mouth:  Dentition intact. No ulcers or lesions.  Neck:  Supple. No lymphadenopathy or thyromegaly.  Lungs: Breath sounds clear throughout. Heart: Regular rate and rhythm, no murmurs. Abdomen: Mild distention, soft and nontender.  Positive bowel sounds to all 4 quadrants.  No hepatosplenomegaly.  Positive bowel sounds all 4 quadrants. Rectal: Deferred. Musculoskeletal:  Symmetrical without gross deformities.  Pulses:  Normal pulses noted. Extremities:  Without clubbing or edema. Neurologic:  Alert and  oriented x4. No focal deficits.  Skin:  Intact without significant lesions or rashes. Psych:  Alert and cooperative. Normal mood and affect.  Intake/Output from previous day: 08/15 0701 - 08/16 0700 In: 2050 [IV Piggyback:2050] Out: -  Intake/Output this shift: No intake/output data recorded.  Lab Results: Recent Labs    10/31/20 1654 11/01/20 1952 11/02/20 0541  WBC 16.8* 20.5* 17.4*  HGB 15.4 14.7 13.3  HCT 46.1 43.5 39.7  PLT 232 245 208   BMET Recent Labs    10/31/20 1654 11/01/20 1952 11/02/20 0541  NA 137 137 141  K 4.3 4.9 3.7  CL 102 103 106  CO2 '26 26 26  ' GLUCOSE 167* 149* 111*  BUN '11 9 10  ' CREATININE 0.95 0.93 0.97  CALCIUM 9.8 9.8 9.6   LFT Recent Labs    11/02/20 0541  PROT 7.1  ALBUMIN 3.3*  AST 29  ALT 90*  ALKPHOS 178*  BILITOT 2.5*  BILIDIR 0.9*  IBILI 1.6*   PT/INR No results for input(s): LABPROT, INR in the last 72 hours. Hepatitis Panel No results for input(s): HEPBSAG, HCVAB, HEPAIGM, HEPBIGM in the last 72 hours.    Studies/Results: CT Abdomen Pelvis W Contrast  Result Date: 10/31/2020 CLINICAL DATA:  Abdominal distension, abdominal pain. EXAM: CT ABDOMEN AND PELVIS WITH CONTRAST TECHNIQUE: Multidetector CT imaging of the abdomen and pelvis was performed using the standard protocol following bolus administration of intravenous contrast. CONTRAST:  64m  OMNIPAQUE IOHEXOL 350 MG/ML SOLN COMPARISON:  None. FINDINGS: Lower chest: Mild bibasilar scarring/atelectasis. Hepatobiliary: No focal liver abnormality is seen. Gallbladder walls appear thickened but there is no pericholecystic fluid seen. Pancreas: Ill-defined fluid/edema about the pancreatic head/proximal body. Pancreatic tail appears normal. Spleen: Normal in size without focal abnormality. Adrenals/Urinary Tract: Adrenal glands appear normal. Kidneys are unremarkable without mass, stone or hydronephrosis. No ureteral or bladder calculi are identified. Bladder is unremarkable, decompressed. Stomach/Bowel: No dilated large or small bowel loops. Appendix is normal. Diverticulosis within the upper sigmoid colon but no focal inflammatory change to suggest acute diverticulitis. Probable reactive thickening of the walls of the duodenum, given the proximity to the peripancreatic inflammation.  Stomach is unremarkable, partially decompressed. Vascular/Lymphatic: Mild aortic atherosclerosis. No acute-appearing vascular abnormality. No enlarged lymph nodes seen in the abdomen or pelvis. Reproductive: Prostate is unremarkable. Other: No active hemorrhage or abscess collection. No free intraperitoneal air. Musculoskeletal: No acute or suspicious osseous abnormality. Small RIGHT inguinal hernia which contains fat only. IMPRESSION: 1. Findings are consistent with acute pancreatitis. No evidence of pancreatic necrosis. 2. Gallbladder walls appear thickened but there is no pericholecystic fluid seen, likely reactive to the adjacent pancreatitis. 3. Similarly, suspect reactive thickening of the walls of the duodenum given the adjacent peripancreatic inflammation. 4. Colonic diverticulosis without evidence of acute diverticulitis. 5. Small RIGHT inguinal hernia which contains fat only. Aortic Atherosclerosis (ICD10-I70.0). Electronically Signed   By: Franki Cabot M.D.   On: 10/31/2020 20:11   MR ABDOMEN MRCP WO  CONTRAST  Result Date: 11/02/2020 CLINICAL DATA:  Worsening right upper quadrant and epigastric abdominal pain. Cholelithiasis. Acute pancreatitis. EXAM: MRI ABDOMEN WITHOUT CONTRAST  (INCLUDING MRCP) TECHNIQUE: Multiplanar multisequence MR imaging of the abdomen was performed. Heavily T2-weighted images of the biliary and pancreatic ducts were obtained, and three-dimensional MRCP images were rendered by post processing. COMPARISON:  CT on 10/31/2020 FINDINGS: Lower chest: No acute findings. Hepatobiliary: No masses visualized on this unenhanced exam. Multiple gallstones are seen, largest measuring 2.4 cm. No evidence of gallbladder wall thickening or pericholecystic inflammatory changes. Borderline dilatation of common bile duct is seen measuring up to 6 mm. A few tiny calculi are seen in the distal common bile duct on images 24 - 28/series 12. Pancreas: Moderate edema is seen involving the pancreatic head and body with adjacent peripancreatic inflammatory changes, consistent with acute pancreatitis. Minimal peripancreatic fluid is seen in the retroperitoneum, however there is no evidence of pseudocyst. Spleen:  Within normal limits in size. Adrenals/Urinary tract: Unremarkable. No evidence of nephrolithiasis or hydronephrosis. Stomach/Bowel: Visualized portion unremarkable. Vascular/Lymphatic: No pathologically enlarged lymph nodes identified. No evidence of abdominal aortic aneurysm. Other:  None. Musculoskeletal:  No suspicious bone lesions identified. IMPRESSION: Moderate acute pancreatitis. No evidence of pseudocyst. Borderline dilatation of common bile duct, with tiny calculi in the distal common bile duct. Cholelithiasis. No radiographic evidence of cholecystitis. Electronically Signed   By: Marlaine Hind M.D.   On: 11/02/2020 08:10   MR 3D Recon At Scanner  Result Date: 11/02/2020 CLINICAL DATA:  Worsening right upper quadrant and epigastric abdominal pain. Cholelithiasis. Acute pancreatitis. EXAM: MRI  ABDOMEN WITHOUT CONTRAST  (INCLUDING MRCP) TECHNIQUE: Multiplanar multisequence MR imaging of the abdomen was performed. Heavily T2-weighted images of the biliary and pancreatic ducts were obtained, and three-dimensional MRCP images were rendered by post processing. COMPARISON:  CT on 10/31/2020 FINDINGS: Lower chest: No acute findings. Hepatobiliary: No masses visualized on this unenhanced exam. Multiple gallstones are seen, largest measuring 2.4 cm. No evidence of gallbladder wall thickening or pericholecystic inflammatory changes. Borderline dilatation of common bile duct is seen measuring up to 6 mm. A few tiny calculi are seen in the distal common bile duct on images 24 - 28/series 12. Pancreas: Moderate edema is seen involving the pancreatic head and body with adjacent peripancreatic inflammatory changes, consistent with acute pancreatitis. Minimal peripancreatic fluid is seen in the retroperitoneum, however there is no evidence of pseudocyst. Spleen:  Within normal limits in size. Adrenals/Urinary tract: Unremarkable. No evidence of nephrolithiasis or hydronephrosis. Stomach/Bowel: Visualized portion unremarkable. Vascular/Lymphatic: No pathologically enlarged lymph nodes identified. No evidence of abdominal aortic aneurysm. Other:  None. Musculoskeletal:  No suspicious bone lesions identified. IMPRESSION: Moderate acute  pancreatitis. No evidence of pseudocyst. Borderline dilatation of common bile duct, with tiny calculi in the distal common bile duct. Cholelithiasis. No radiographic evidence of cholecystitis. Electronically Signed   By: Marlaine Hind M.D.   On: 11/02/2020 08:10   US Abdomen Limited RUQ (LIVER/GB)  Result Date: 11/02/2020 CLINICAL DATA:  Thickened gallbladder walls on recent CT examination EXAM: ULTRASOUND ABDOMEN LIMITED RIGHT UPPER QUADRANT COMPARISON:  CT from 10/31/2020 FINDINGS: Gallbladder: Gallbladder is well distended with evidence of cholelithiasis. Negative sonographic Murphy's  sign is elicited. Mild wall thickening is noted likely related to incomplete distension. Common bile duct: Diameter: 3.7 mm. Liver: No focal lesion identified. Within normal limits in parenchymal echogenicity. Portal vein is patent on color Doppler imaging with normal direction of blood flow towards the liver. Other: None. IMPRESSION: Cholelithiasis without definitive complicating factors. Negative sonographic Murphy's sign was elicited. Electronically Signed   By: Inez Catalina M.D.   On: 11/02/2020 01:34   US Abdomen Limited RUQ (LIVER/GB)  Result Date: 10/31/2020 CLINICAL DATA:  Right upper quadrant pain for 5 days. EXAM: ULTRASOUND ABDOMEN LIMITED RIGHT UPPER QUADRANT COMPARISON:  None. FINDINGS: Gallbladder: Physiologically distended. Shadowing intraluminal gallstone measuring 2.3 cm. Gallbladder wall thickness is upper normal at 2 to 3 mm. No pericholecystic fluid. No sonographic Murphy sign noted by sonographer. Common bile duct: Diameter: 5 mm, normal Liver: No focal lesion identified. Within normal limits in parenchymal echogenicity. Portal vein is patent on color Doppler imaging with normal direction of blood flow towards the liver. Other: No right upper quadrant ascites. IMPRESSION: 1. Gallstone without sonographic findings of acute cholecystitis. 2. No biliary dilatation. Electronically Signed   By: Keith Rake M.D.   On: 10/31/2020 19:57    IMPRESSION/PLAN:  33) 60 year old male admitted to the hospital 10/31/2020 secondary to epigastric pain and elevated LFTs. RUQ sono showed a 2.3 cm gallstone without evidence of cholecystitis or biliary ductal dilatation.  CTAP was consistent with acute pancreatitis without evidence of gallstones or choledocholithiasis.  A repeat RUQ was done today due to worsening upper abdominal pain which again showed a gallstone without evidence of acute cholecystitis.  An abdominal MRI/MRCP without contrast was done which identified acute pancreatitis without evidence  of a pseudocyst and borderline dilatation of the CBD with a tiny calculi in the distal CBD. Alk phos 241 -> 178. T. Bili 1.6. AST 41 -> 29. ALT 119 -> 90. Lipase 67 -> 34.  Triglyceride level pending. WBC  20.5 -> 17.4. He is afebrile. On Zosyn IV.  -ERCP benefits and risks discussed including risk with sedation, risk of bleeding, perforation, pancreatitis and infection, timing to be verified by Dr. Lyndel Safe  -He will most likely require eventual cholecystectomy -Continue IVF # 200cc/hr -Continue Zosyn IV for now -CBC, hepatic panel and BMP in a.m. -Clear liquids for now -Pain management per the hospitalist -Pantoprazole 40 mg p.o. daily  2) History of tubular adenomatous colon polyps per colonoscopy 07/2020 -Next colonoscopy due 07/2023  3) History of an anal fistula -Schedule outpatient evaluation with general surgery as previously recommended   4) History of diverticulitis    Noralyn Pick  11/02/2020, 12:11 PM    Attending physician's note   I have taken an interval history, reviewed the chart and examined the patient. I agree with the Advanced Practitioner's note, impression and recommendations.   MRCP reviewed.  Choledocholithiasis on MRCP Gallstone pancreatitis-looks like he may have passed the implicating stone. No ascending cholangitis.  However, white cell count is elevated.  Plan: -  Supportive treatment -Continue IV Zosyn for now. -Trend CBC, CMP and lipase. -ERCP in a.m. -Thereafter, would require lap chole.   I have explained risks and benefits including small but definite risks of pancreatitis (<10%), bleeding (<1%), perforation (<1%).  The risks of general anesthesia were also discussed.  The benefits were also discussed.  Patient wishes to proceed.  All the questions were answered.   Carmell Austria, MD [facility] [phone]

## 2020-11-02 NOTE — H&P (Signed)
History and Physical    Sean Valencia [id number] DOB: 1961/01/11 DOA: 11/01/2020  PCP: Patient, No Pcp Per (Inactive)  Patient coming from: Home.  Chief Complaint: Abdominal pain.    HPI: Sean Valencia is a 60 y.o. adult with history of hypertension presently not on any medication presents to the ER with complaints of persistent abdominal pain.  Patient has been having abdominal pain for the last few months but over the last 1 week it became more persistent.  Pain is mostly in the right upper quadrant and epigastric area.  Denies any chest pain or shortness of breath.  Had 1 episode of nausea vomiting.  Patient had come to the ER on August 14 and had labs drawn which showed lipase of 67 with LFTs of 83 and 181.  Total bilirubin was 3.9.  At that time CT abdomen pelvis showed features concerning for acute pancreatitis.  Since patient had some work to be done at home patient left and came back pain.  Patient states he drinks alcohol only socially.  ED Course: In the ER sonogram of the abdomen shows cholelithiasis.  Labs show improvement in total bilirubin to 2.7 AST ALT is improved.  But still has pain.  ER physician discussed with Dr. Havery Moros on-call gastroenterologist.  Will be seeing patient in consult requested MRCP.  Review of Systems: As per HPI, rest all negative.   Past Medical History:  Diagnosis Date   Allergy    Anal fistula    Arthritis    History of kidney stones    HTN (hypertension)     Past Surgical History:  Procedure Laterality Date   INGUINAL HERNIA REPAIR Bilateral 1992     reports that he has never smoked. He has never used smokeless tobacco. He reports current alcohol use. He reports that he does not use drugs.  No Known Allergies  Family History  Problem Relation Age of Onset   Diabetes Father    Hypertension Father    Bone cancer Maternal Grandfather    Diabetes Paternal Grandmother    Diabetes Paternal Grandfather    Leukemia Paternal  Grandfather    Hypercalcemia Brother    Colon cancer Neg Hx    Esophageal cancer Neg Hx    Stomach cancer Neg Hx    Rectal cancer Neg Hx     Prior to Admission medications   Not on File    Physical Exam: Constitutional: Moderately built and nourished. Vitals:   11/02/20 0330 11/02/20 0400 11/02/20 0500 11/02/20 0530  BP: (!) 194/104 (!) 178/99 94/71 (!) 183/97  Pulse: 69 71 78 80  Resp: '16 18 19 '$ (!) 21  Temp:      TempSrc:      SpO2: 100% 96% 100% 98%   Eyes: Anicteric no pallor. ENMT: No discharge from the ears eyes nose and mouth. Neck: No mass felt.  No neck rigidity. Respiratory: No rhonchi or crepitations. Cardiovascular: S1-S2 heard. Abdomen: Soft mild epigastric tenderness no guarding or rigidity. Musculoskeletal: No edema. Skin: No rash. Neurologic: Alert awake oriented to time place and person.  Moves all extremities. Psychiatric: Appears normal.  Normal affect.   Labs on Admission: I have personally reviewed following labs and imaging studies  CBC: Recent Labs  Lab 10/31/20 1654 11/01/20 1952  WBC 16.8* 20.5*  NEUTROABS 14.4* 17.4*  HGB 15.4 14.7  HCT 46.1 43.5  MCV 86.7 86.8  PLT 232 [id number]   Basic Metabolic Panel: Recent Labs  Lab 10/31/20 1654 11/01/20 1952  NA 137 137  K 4.3 4.9  CL 102 103  CO2 26 26  GLUCOSE 167* 149*  BUN 11 9  CREATININE 0.95 0.93  CALCIUM 9.8 9.8   GFR: Estimated Creatinine Clearance (by C-G formula based on SCr of 0.93 mg/dL) Male: 91.4 mL/min Male: 110.8 mL/min Liver Function Tests: Recent Labs  Lab 10/31/20 1654 11/01/20 1952  AST 83* 41  ALT 181* 119*  ALKPHOS 241* 214*  BILITOT 3.9* 2.7*  PROT 7.9 8.1  ALBUMIN 3.7 3.6   Recent Labs  Lab 10/31/20 1654 11/01/20 1952  LIPASE 67* 34   No results for input(s): AMMONIA in the last 168 hours. Coagulation Profile: No results for input(s): INR, PROTIME in the last 168 hours. Cardiac Enzymes: No results for input(s): CKTOTAL, CKMB, CKMBINDEX,  TROPONINI in the last 168 hours. BNP (last 3 results) No results for input(s): PROBNP in the last 8760 hours. HbA1C: No results for input(s): HGBA1C in the last 72 hours. CBG: No results for input(s): GLUCAP in the last 168 hours. Lipid Profile: No results for input(s): CHOL, HDL, LDLCALC, TRIG, CHOLHDL, LDLDIRECT in the last 72 hours. Thyroid Function Tests: No results for input(s): TSH, T4TOTAL, FREET4, T3FREE, THYROIDAB in the last 72 hours. Anemia Panel: No results for input(s): VITAMINB12, FOLATE, FERRITIN, TIBC, IRON, RETICCTPCT in the last 72 hours. Urine analysis:    Component Value Date/Time   COLORURINE AMBER (A) 11/01/2020 1952   APPEARANCEUR CLEAR 11/01/2020 1952   LABSPEC 1.024 11/01/2020 1952   PHURINE 5.0 11/01/2020 1952   GLUCOSEU NEGATIVE 11/01/2020 1952   HGBUR NEGATIVE 11/01/2020 1952   BILIRUBINUR NEGATIVE 11/01/2020 1952   BILIRUBINUR neg 06/14/2013 1048   KETONESUR 20 (A) 11/01/2020 1952   PROTEINUR 100 (A) 11/01/2020 1952   UROBILINOGEN 0.2 06/14/2013 1048   NITRITE NEGATIVE 11/01/2020 1952   LEUKOCYTESUR NEGATIVE 11/01/2020 1952   Sepsis Labs: '@LABRCNTIP'$ (procalcitonin:4,lacticidven:4) ) Recent Results (from the past 240 hour(s))  Urine Culture     Status: None   Collection Time: 10/31/20  5:32 PM   Specimen: Urine, Clean Catch  Result Value Ref Range Status   Specimen Description   Final    URINE, CLEAN CATCH Performed at [facility], [place] [address], [place], [place] [address]    Special Requests   Final    NONE Performed at [facility], [place] [address] [place], [place] [address]    Culture   Final    NO GROWTH Performed at [facility], [place] [address], [place], [place] [address]    Report Status 11/01/2020 FINAL  Final     Radiological Exams on Admission: CT Abdomen Pelvis W Contrast  Result Date: 10/31/2020 CLINICAL DATA:  Abdominal distension, abdominal pain. EXAM: CT ABDOMEN AND PELVIS  WITH CONTRAST TECHNIQUE: Multidetector CT imaging of the abdomen and pelvis was performed using the standard protocol following bolus administration of intravenous contrast. CONTRAST:  39m OMNIPAQUE IOHEXOL 350 MG/ML SOLN COMPARISON:  None. FINDINGS: Lower chest: Mild bibasilar scarring/atelectasis. Hepatobiliary: No focal liver abnormality is seen. Gallbladder walls appear thickened but there is no pericholecystic fluid seen. Pancreas: Ill-defined fluid/edema about the pancreatic head/proximal body. Pancreatic tail appears normal. Spleen: Normal in size without focal abnormality. Adrenals/Urinary Tract: Adrenal glands appear normal. Kidneys are unremarkable without mass, stone or hydronephrosis. No ureteral or bladder calculi are identified. Bladder is unremarkable, decompressed. Stomach/Bowel: No dilated large or small bowel loops. Appendix is normal. Diverticulosis within the upper sigmoid colon but no focal inflammatory change to  suggest acute diverticulitis. Probable reactive thickening of the walls of the duodenum, given the proximity to the peripancreatic inflammation. Stomach is unremarkable, partially decompressed. Vascular/Lymphatic: Mild aortic atherosclerosis. No acute-appearing vascular abnormality. No enlarged lymph nodes seen in the abdomen or pelvis. Reproductive: Prostate is unremarkable. Other: No active hemorrhage or abscess collection. No free intraperitoneal air. Musculoskeletal: No acute or suspicious osseous abnormality. Small RIGHT inguinal hernia which contains fat only. IMPRESSION: 1. Findings are consistent with acute pancreatitis. No evidence of pancreatic necrosis. 2. Gallbladder walls appear thickened but there is no pericholecystic fluid seen, likely reactive to the adjacent pancreatitis. 3. Similarly, suspect reactive thickening of the walls of the duodenum given the adjacent peripancreatic inflammation. 4. Colonic diverticulosis without evidence of acute diverticulitis. 5. Small  RIGHT inguinal hernia which contains fat only. Aortic Atherosclerosis (ICD10-I70.0). Electronically Signed   By: Franki Cabot M.D.   On: 10/31/2020 20:11   US Abdomen Limited RUQ (LIVER/GB)  Result Date: 11/02/2020 CLINICAL DATA:  Thickened gallbladder walls on recent CT examination EXAM: ULTRASOUND ABDOMEN LIMITED RIGHT UPPER QUADRANT COMPARISON:  CT from 10/31/2020 FINDINGS: Gallbladder: Gallbladder is well distended with evidence of cholelithiasis. Negative sonographic Murphy's sign is elicited. Mild wall thickening is noted likely related to incomplete distension. Common bile duct: Diameter: 3.7 mm. Liver: No focal lesion identified. Within normal limits in parenchymal echogenicity. Portal vein is patent on color Doppler imaging with normal direction of blood flow towards the liver. Other: None. IMPRESSION: Cholelithiasis without definitive complicating factors. Negative sonographic Murphy's sign was elicited. Electronically Signed   By: Inez Catalina M.D.   On: 11/02/2020 01:34   US Abdomen Limited RUQ (LIVER/GB)  Result Date: 10/31/2020 CLINICAL DATA:  Right upper quadrant pain for 5 days. EXAM: ULTRASOUND ABDOMEN LIMITED RIGHT UPPER QUADRANT COMPARISON:  None. FINDINGS: Gallbladder: Physiologically distended. Shadowing intraluminal gallstone measuring 2.3 cm. Gallbladder wall thickness is upper normal at 2 to 3 mm. No pericholecystic fluid. No sonographic Murphy sign noted by sonographer. Common bile duct: Diameter: 5 mm, normal Liver: No focal lesion identified. Within normal limits in parenchymal echogenicity. Portal vein is patent on color Doppler imaging with normal direction of blood flow towards the liver. Other: No right upper quadrant ascites. IMPRESSION: 1. Gallstone without sonographic findings of acute cholecystitis. 2. No biliary dilatation. Electronically Signed   By: Keith Rake M.D.   On: 10/31/2020 19:57      Assessment/Plan Principal Problem:   Acute pancreatitis Active  Problems:   Elevated blood pressure reading   Gallstones    Acute pancreatitis could be secondary to gallstones -Patient n.p.o. we will get MRCP Dr. Latrelle Dodrill on-call gastroenterology has been consulted.  Aggressive IV hydration pain relief medications.  Closely follow BUN/creatinine hemoglobin hematocrit.  Check triglyceride levels.  Since patient has leukocytosis which is increased from prior we will keep patient on empiric antibiotics. Elevated blood pressure has not been taking any antihypertensives.  Patient states he used to takes garlic.  We will keep patient as needed labetalol and closely follow blood pressure trends.  Since patient is acute pancreatitis will need close monitoring and further work-up and will need inpatient status.   DVT prophylaxis: SCDs.  Avoiding anticoagulation in anticipation of possible procedure. Code Status: Full code. Family Communication: Discussed with patient. Disposition Plan: Home. Consults called: Copywriter, advertising. Admission status: Inpatient.   Rise Patience MD [facility] Pager [phone].  If 7PM-7AM, please contact night-coverage [email or web address] Password [facility]  11/02/2020, 6:08 AM

## 2020-11-02 NOTE — ED Notes (Signed)
Patient To MRI

## 2020-11-03 DIAGNOSIS — K851 Biliary acute pancreatitis without necrosis or infection: Secondary | ICD-10-CM

## 2020-11-03 LAB — COMPREHENSIVE METABOLIC PANEL
ALT: 60 U/L — ABNORMAL HIGH (ref 0–44)
AST: 20 U/L (ref 15–41)
Albumin: 2.7 g/dL — ABNORMAL LOW (ref 3.5–5.0)
Alkaline Phosphatase: 151 U/L — ABNORMAL HIGH (ref 38–126)
Anion gap: 8 (ref 5–15)
BUN: 8 mg/dL (ref 6–20)
CO2: 27 mmol/L (ref 22–32)
Calcium: 9 mg/dL (ref 8.9–10.3)
Chloride: 105 mmol/L (ref 98–111)
Creatinine, Ser: 0.87 mg/dL (ref 0.61–1.24)
GFR, Estimated: >60 mL/min (ref >60)
Glucose, Bld: 117 mg/dL — ABNORMAL HIGH (ref 70–99)
Potassium: 3.8 mmol/L (ref 3.5–5.1)
Sodium: 140 mmol/L (ref 135–145)
Total Bilirubin: 1.9 mg/dL — ABNORMAL HIGH (ref 0.3–1.2)
Total Protein: 6.6 g/dL (ref 6.5–8.1)

## 2020-11-03 LAB — CBC
HCT: 38.4 % — ABNORMAL LOW (ref 39.0–52.0)
Hemoglobin: 12.7 g/dL — ABNORMAL LOW (ref 13.0–17.0)
MCH: 28.7 pg (ref 26.0–34.0)
MCHC: 33.1 g/dL (ref 30.0–36.0)
MCV: 86.9 fL (ref 80.0–100.0)
Platelets: 247 10*3/uL (ref 150–400)
RBC: 4.42 MIL/uL (ref 4.22–5.81)
RDW: 13.8 % (ref 11.5–15.5)
WBC: 11.9 10*3/uL — ABNORMAL HIGH (ref 4.0–10.5)
nRBC: 0 % (ref 0.0–0.2)

## 2020-11-03 LAB — GLUCOSE, CAPILLARY
Glucose-Capillary: 109 mg/dL — ABNORMAL HIGH (ref 70–99)
Glucose-Capillary: 128 mg/dL — ABNORMAL HIGH (ref 70–99)
Glucose-Capillary: 151 mg/dL — ABNORMAL HIGH (ref 70–99)
Glucose-Capillary: 154 mg/dL — ABNORMAL HIGH (ref 70–99)

## 2020-11-03 LAB — LIPASE, BLOOD: Lipase: 30 U/L (ref 11–51)

## 2020-11-03 MED ORDER — AMLODIPINE BESYLATE 10 MG PO TABS
10.0000 mg | ORAL_TABLET | Freq: Every day | ORAL | Status: DC
  Administered 2020-11-03 – 2020-11-08 (×6): 10 mg via ORAL
  Filled 2020-11-03 (×6): qty 1

## 2020-11-03 NOTE — H&P (View-Only) (Signed)
[facility] Progress Note  CC:  Pancreatitis, choledocholithiasis  Subjective:  He is having mild central abdominal discomfort, no significant abdominal pain. No N/V. He remains NPO for ERCP. No BM today.   Objective:  Vital signs in last 24 hours: Temp:  [98.7 F (37.1 C)-99.5 F (37.5 C)] 99 F (37.2 C) (08/17 0908) Pulse Rate:  [67-85] 85 (08/17 0908) Resp:  [16-20] 16 (08/17 0908) BP: (154-176)/(72-88) 172/86 (08/17 0908) SpO2:  [95 %-100 %] 99 % (08/17 0908) Weight:  [113.4 kg-116.3 kg] 116.3 kg (08/17 0500) Last BM Date: 11/01/20 General:   Alert in no acute distress. Eyes: No scleral icterus. Heart: Regular rate and rhythm, no murmurs. Pulm: Breath sounds clear throughout. Abdomen: Soft, nondistended.  Mild central abdominal tenderness without rebound or guarding.  Positive bowel sounds all 4 quadrants. Extremities:  Without edema. Neurologic:  Alert and  oriented x4;  grossly normal neurologically. Psych:  Alert and cooperative. Normal mood and affect.  Intake/Output from previous day: 08/16 0701 - 08/17 0700 In: 2672.4 [P.O.:820; I.V.:1714.8; IV Piggyback:137.6] Out: 1050 [Urine:1050] Intake/Output this shift: Total I/O In: 0  Out: 200 [Urine:200]  Lab Results: Recent Labs    11/01/20 1952 11/02/20 0541 11/03/20 0428  WBC 20.5* 17.4* 11.9*  HGB 14.7 13.3 12.7*  HCT 43.5 39.7 38.4*  PLT 245 208 247   BMET Recent Labs    11/01/20 1952 11/02/20 0541 11/03/20 0428  NA 137 141 140  K 4.9 3.7 3.8  CL 103 106 105  CO2 '26 26 27  ' GLUCOSE 149* 111* 117*  BUN '9 10 8  ' CREATININE 0.93 0.97 0.87  CALCIUM 9.8 9.6 9.0   LFT Recent Labs    11/02/20 0541 11/03/20 0428  PROT 7.1 6.6  ALBUMIN 3.3* 2.7*  AST 29 20  ALT 90* 60*  ALKPHOS 178* 151*  BILITOT 2.5* 1.9*  BILIDIR 0.9*  --   IBILI 1.6*  --    PT/INR No results for input(s): LABPROT, INR in the last 72 hours. Hepatitis Panel No results for input(s): HEPBSAG, HCVAB,  HEPAIGM, HEPBIGM in the last 72 hours.  MR ABDOMEN MRCP WO CONTRAST  Result Date: 11/02/2020 CLINICAL DATA:  Worsening right upper quadrant and epigastric abdominal pain. Cholelithiasis. Acute pancreatitis. EXAM: MRI ABDOMEN WITHOUT CONTRAST  (INCLUDING MRCP) TECHNIQUE: Multiplanar multisequence MR imaging of the abdomen was performed. Heavily T2-weighted images of the biliary and pancreatic ducts were obtained, and three-dimensional MRCP images were rendered by post processing. COMPARISON:  CT on 10/31/2020 FINDINGS: Lower chest: No acute findings. Hepatobiliary: No masses visualized on this unenhanced exam. Multiple gallstones are seen, largest measuring 2.4 cm. No evidence of gallbladder wall thickening or pericholecystic inflammatory changes. Borderline dilatation of common bile duct is seen measuring up to 6 mm. A few tiny calculi are seen in the distal common bile duct on images 24 - 28/series 12. Pancreas: Moderate edema is seen involving the pancreatic head and body with adjacent peripancreatic inflammatory changes, consistent with acute pancreatitis. Minimal peripancreatic fluid is seen in the retroperitoneum, however there is no evidence of pseudocyst. Spleen:  Within normal limits in size. Adrenals/Urinary tract: Unremarkable. No evidence of nephrolithiasis or hydronephrosis. Stomach/Bowel: Visualized portion unremarkable. Vascular/Lymphatic: No pathologically enlarged lymph nodes identified. No evidence of abdominal aortic aneurysm. Other:  None. Musculoskeletal:  No suspicious bone lesions identified. IMPRESSION: Moderate acute pancreatitis. No evidence of pseudocyst. Borderline dilatation of common bile duct, with tiny calculi in the distal common bile duct. Cholelithiasis. No radiographic evidence of  cholecystitis. Electronically Signed   By: Marlaine Hind M.D.   On: 11/02/2020 08:10   MR 3D Recon At Scanner  Result Date: 11/02/2020 CLINICAL DATA:  Worsening right upper quadrant and epigastric  abdominal pain. Cholelithiasis. Acute pancreatitis. EXAM: MRI ABDOMEN WITHOUT CONTRAST  (INCLUDING MRCP) TECHNIQUE: Multiplanar multisequence MR imaging of the abdomen was performed. Heavily T2-weighted images of the biliary and pancreatic ducts were obtained, and three-dimensional MRCP images were rendered by post processing. COMPARISON:  CT on 10/31/2020 FINDINGS: Lower chest: No acute findings. Hepatobiliary: No masses visualized on this unenhanced exam. Multiple gallstones are seen, largest measuring 2.4 cm. No evidence of gallbladder wall thickening or pericholecystic inflammatory changes. Borderline dilatation of common bile duct is seen measuring up to 6 mm. A few tiny calculi are seen in the distal common bile duct on images 24 - 28/series 12. Pancreas: Moderate edema is seen involving the pancreatic head and body with adjacent peripancreatic inflammatory changes, consistent with acute pancreatitis. Minimal peripancreatic fluid is seen in the retroperitoneum, however there is no evidence of pseudocyst. Spleen:  Within normal limits in size. Adrenals/Urinary tract: Unremarkable. No evidence of nephrolithiasis or hydronephrosis. Stomach/Bowel: Visualized portion unremarkable. Vascular/Lymphatic: No pathologically enlarged lymph nodes identified. No evidence of abdominal aortic aneurysm. Other:  None. Musculoskeletal:  No suspicious bone lesions identified. IMPRESSION: Moderate acute pancreatitis. No evidence of pseudocyst. Borderline dilatation of common bile duct, with tiny calculi in the distal common bile duct. Cholelithiasis. No radiographic evidence of cholecystitis. Electronically Signed   By: Marlaine Hind M.D.   On: 11/02/2020 08:10   US Abdomen Limited RUQ (LIVER/GB)  Result Date: 11/02/2020 CLINICAL DATA:  Thickened gallbladder walls on recent CT examination EXAM: ULTRASOUND ABDOMEN LIMITED RIGHT UPPER QUADRANT COMPARISON:  CT from 10/31/2020 FINDINGS: Gallbladder: Gallbladder is well distended  with evidence of cholelithiasis. Negative sonographic Murphy's sign is elicited. Mild wall thickening is noted likely related to incomplete distension. Common bile duct: Diameter: 3.7 mm. Liver: No focal lesion identified. Within normal limits in parenchymal echogenicity. Portal vein is patent on color Doppler imaging with normal direction of blood flow towards the liver. Other: None. IMPRESSION: Cholelithiasis without definitive complicating factors. Negative sonographic Murphy's sign was elicited. Electronically Signed   By: Inez Catalina M.D.   On: 11/02/2020 01:34    Assessment / Plan:  26) 60 year old male admitted to the hospital 10/31/2020 secondary to epigastric pain and elevated LFTs. RUQ sono showed a 2.3 cm gallstone without evidence of cholecystitis or biliary ductal dilatation.  CTAP was consistent with acute pancreatitis without evidence of gallstones or choledocholithiasis.  A repeat RUQ was done today due to worsening upper abdominal pain which again showed a gallstone without evidence of acute cholecystitis.  An abdominal MRI/MRCP without contrast was done which identified acute pancreatitis without evidence of a pseudocyst and borderline dilatation of the CBD with a tiny calculi in the distal CBD. Alk phos 241 -> 178. T. Bili 1.6. AST 41 -> 29. ALT 119 -> 90. Lipase 67 -> 34.  Triglyceride level pending. WBC  20.5 -> 17.4 -> 11.9. He is afebrile. On Zosyn IV.  -ERCP benefits and risks discussed including risk with sedation, risk of bleeding, perforation, pancreatitis and infection. ERCP tentatively scheduled for this afternoon if anesthesia available, there is a possibility his ERCP may need to be done tomorrow which I have discussed with Mr. Hendley.  Hopefully there will be sufficient time in endo with anesthesia to get the ERCP done later this afternoon. -NPO -Continue IV fluids -Pain  management per the hospitalist -Zofran 4 mg p.o. or IV every 6 hours as needed   2) History of tubular  adenomatous colon polyps per colonoscopy 07/2020 -Next colonoscopy due 07/2023   3) History of an anal fistula -Schedule outpatient evaluation with general surgery as previously recommended    4) History of diverticulitis   5) Normocytic anemia. Hg 13.3 -> 12.7. No overt GI  bleeding, likely dilutional component d/t aggressive IV hydration.   Principal Problem:   Acute pancreatitis Active Problems:   Elevated blood pressure reading   Gallstones   Gallstone pancreatitis     LOS: 1 day   Noralyn Pick  11/03/2020, 11:08 AM    Attending physician's note   I have taken an interval history, reviewed the chart and examined the patient. I agree with the Advanced Practitioner's note, impression and recommendations.   ERCP could not be performed today d/t anesthesia constraints Scheduled for tomorrow.  Advance diet to full liquid diet. Ambulate Trend CBC, CMP and lipase. Will continue IV Zosyn for now.    Carmell Austria, MD [facility] [phone]

## 2020-11-03 NOTE — Progress Notes (Addendum)
[facility] Progress Note  CC:  Pancreatitis, choledocholithiasis  Subjective:  He is having mild central abdominal discomfort, no significant abdominal pain. No N/V. He remains NPO for ERCP. No BM today.   Objective:  Vital signs in last 24 hours: Temp:  [98.7 F (37.1 C)-99.5 F (37.5 C)] 99 F (37.2 C) (08/17 0908) Pulse Rate:  [67-85] 85 (08/17 0908) Resp:  [16-20] 16 (08/17 0908) BP: (154-176)/(72-88) 172/86 (08/17 0908) SpO2:  [95 %-100 %] 99 % (08/17 0908) Weight:  [113.4 kg-116.3 kg] 116.3 kg (08/17 0500) Last BM Date: 11/01/20 General:   Alert in no acute distress. Eyes: No scleral icterus. Heart: Regular rate and rhythm, no murmurs. Pulm: Breath sounds clear throughout. Abdomen: Soft, nondistended.  Mild central abdominal tenderness without rebound or guarding.  Positive bowel sounds all 4 quadrants. Extremities:  Without edema. Neurologic:  Alert and  oriented x4;  grossly normal neurologically. Psych:  Alert and cooperative. Normal mood and affect.  Intake/Output from previous day: 08/16 0701 - 08/17 0700 In: 2672.4 [P.O.:820; I.V.:1714.8; IV Piggyback:137.6] Out: 1050 [Urine:1050] Intake/Output this shift: Total I/O In: 0  Out: 200 [Urine:200]  Lab Results: Recent Labs    11/01/20 1952 11/02/20 0541 11/03/20 0428  WBC 20.5* 17.4* 11.9*  HGB 14.7 13.3 12.7*  HCT 43.5 39.7 38.4*  PLT 245 208 247   BMET Recent Labs    11/01/20 1952 11/02/20 0541 11/03/20 0428  NA 137 141 140  K 4.9 3.7 3.8  CL 103 106 105  CO2 '26 26 27  ' GLUCOSE 149* 111* 117*  BUN '9 10 8  ' CREATININE 0.93 0.97 0.87  CALCIUM 9.8 9.6 9.0   LFT Recent Labs    11/02/20 0541 11/03/20 0428  PROT 7.1 6.6  ALBUMIN 3.3* 2.7*  AST 29 20  ALT 90* 60*  ALKPHOS 178* 151*  BILITOT 2.5* 1.9*  BILIDIR 0.9*  --   IBILI 1.6*  --    PT/INR No results for input(s): LABPROT, INR in the last 72 hours. Hepatitis Panel No results for input(s): HEPBSAG, HCVAB,  HEPAIGM, HEPBIGM in the last 72 hours.  MR ABDOMEN MRCP WO CONTRAST  Result Date: 11/02/2020 CLINICAL DATA:  Worsening right upper quadrant and epigastric abdominal pain. Cholelithiasis. Acute pancreatitis. EXAM: MRI ABDOMEN WITHOUT CONTRAST  (INCLUDING MRCP) TECHNIQUE: Multiplanar multisequence MR imaging of the abdomen was performed. Heavily T2-weighted images of the biliary and pancreatic ducts were obtained, and three-dimensional MRCP images were rendered by post processing. COMPARISON:  CT on 10/31/2020 FINDINGS: Lower chest: No acute findings. Hepatobiliary: No masses visualized on this unenhanced exam. Multiple gallstones are seen, largest measuring 2.4 cm. No evidence of gallbladder wall thickening or pericholecystic inflammatory changes. Borderline dilatation of common bile duct is seen measuring up to 6 mm. A few tiny calculi are seen in the distal common bile duct on images 24 - 28/series 12. Pancreas: Moderate edema is seen involving the pancreatic head and body with adjacent peripancreatic inflammatory changes, consistent with acute pancreatitis. Minimal peripancreatic fluid is seen in the retroperitoneum, however there is no evidence of pseudocyst. Spleen:  Within normal limits in size. Adrenals/Urinary tract: Unremarkable. No evidence of nephrolithiasis or hydronephrosis. Stomach/Bowel: Visualized portion unremarkable. Vascular/Lymphatic: No pathologically enlarged lymph nodes identified. No evidence of abdominal aortic aneurysm. Other:  None. Musculoskeletal:  No suspicious bone lesions identified. IMPRESSION: Moderate acute pancreatitis. No evidence of pseudocyst. Borderline dilatation of common bile duct, with tiny calculi in the distal common bile duct. Cholelithiasis. No radiographic evidence of  cholecystitis. Electronically Signed   By: Marlaine Hind M.D.   On: 11/02/2020 08:10   MR 3D Recon At Scanner  Result Date: 11/02/2020 CLINICAL DATA:  Worsening right upper quadrant and epigastric  abdominal pain. Cholelithiasis. Acute pancreatitis. EXAM: MRI ABDOMEN WITHOUT CONTRAST  (INCLUDING MRCP) TECHNIQUE: Multiplanar multisequence MR imaging of the abdomen was performed. Heavily T2-weighted images of the biliary and pancreatic ducts were obtained, and three-dimensional MRCP images were rendered by post processing. COMPARISON:  CT on 10/31/2020 FINDINGS: Lower chest: No acute findings. Hepatobiliary: No masses visualized on this unenhanced exam. Multiple gallstones are seen, largest measuring 2.4 cm. No evidence of gallbladder wall thickening or pericholecystic inflammatory changes. Borderline dilatation of common bile duct is seen measuring up to 6 mm. A few tiny calculi are seen in the distal common bile duct on images 24 - 28/series 12. Pancreas: Moderate edema is seen involving the pancreatic head and body with adjacent peripancreatic inflammatory changes, consistent with acute pancreatitis. Minimal peripancreatic fluid is seen in the retroperitoneum, however there is no evidence of pseudocyst. Spleen:  Within normal limits in size. Adrenals/Urinary tract: Unremarkable. No evidence of nephrolithiasis or hydronephrosis. Stomach/Bowel: Visualized portion unremarkable. Vascular/Lymphatic: No pathologically enlarged lymph nodes identified. No evidence of abdominal aortic aneurysm. Other:  None. Musculoskeletal:  No suspicious bone lesions identified. IMPRESSION: Moderate acute pancreatitis. No evidence of pseudocyst. Borderline dilatation of common bile duct, with tiny calculi in the distal common bile duct. Cholelithiasis. No radiographic evidence of cholecystitis. Electronically Signed   By: Marlaine Hind M.D.   On: 11/02/2020 08:10   US Abdomen Limited RUQ (LIVER/GB)  Result Date: 11/02/2020 CLINICAL DATA:  Thickened gallbladder walls on recent CT examination EXAM: ULTRASOUND ABDOMEN LIMITED RIGHT UPPER QUADRANT COMPARISON:  CT from 10/31/2020 FINDINGS: Gallbladder: Gallbladder is well distended  with evidence of cholelithiasis. Negative sonographic Murphy's sign is elicited. Mild wall thickening is noted likely related to incomplete distension. Common bile duct: Diameter: 3.7 mm. Liver: No focal lesion identified. Within normal limits in parenchymal echogenicity. Portal vein is patent on color Doppler imaging with normal direction of blood flow towards the liver. Other: None. IMPRESSION: Cholelithiasis without definitive complicating factors. Negative sonographic Murphy's sign was elicited. Electronically Signed   By: Inez Catalina M.D.   On: 11/02/2020 01:34    Assessment / Plan:  57) 60 year old male admitted to the hospital 10/31/2020 secondary to epigastric pain and elevated LFTs. RUQ sono showed a 2.3 cm gallstone without evidence of cholecystitis or biliary ductal dilatation.  CTAP was consistent with acute pancreatitis without evidence of gallstones or choledocholithiasis.  A repeat RUQ was done today due to worsening upper abdominal pain which again showed a gallstone without evidence of acute cholecystitis.  An abdominal MRI/MRCP without contrast was done which identified acute pancreatitis without evidence of a pseudocyst and borderline dilatation of the CBD with a tiny calculi in the distal CBD. Alk phos 241 -> 178. T. Bili 1.6. AST 41 -> 29. ALT 119 -> 90. Lipase 67 -> 34.  Triglyceride level pending. WBC  20.5 -> 17.4 -> 11.9. He is afebrile. On Zosyn IV.  -ERCP benefits and risks discussed including risk with sedation, risk of bleeding, perforation, pancreatitis and infection. ERCP tentatively scheduled for this afternoon if anesthesia available, there is a possibility his ERCP may need to be done tomorrow which I have discussed with Mr. Shell.  Hopefully there will be sufficient time in endo with anesthesia to get the ERCP done later this afternoon. -NPO -Continue IV fluids -Pain  management per the hospitalist -Zofran 4 mg p.o. or IV every 6 hours as needed   2) History of tubular  adenomatous colon polyps per colonoscopy 07/2020 -Next colonoscopy due 07/2023   3) History of an anal fistula -Schedule outpatient evaluation with general surgery as previously recommended    4) History of diverticulitis   5) Normocytic anemia. Hg 13.3 -> 12.7. No overt GI  bleeding, likely dilutional component d/t aggressive IV hydration.   Principal Problem:   Acute pancreatitis Active Problems:   Elevated blood pressure reading   Gallstones   Gallstone pancreatitis     LOS: 1 day   Noralyn Pick  11/03/2020, 11:08 AM    Attending physician's note   I have taken an interval history, reviewed the chart and examined the patient. I agree with the Advanced Practitioner's note, impression and recommendations.   ERCP could not be performed today d/t anesthesia constraints Scheduled for tomorrow.  Advance diet to full liquid diet. Ambulate Trend CBC, CMP and lipase. Will continue IV Zosyn for now.    Carmell Austria, MD [facility] [phone]

## 2020-11-03 NOTE — Progress Notes (Signed)
PROGRESS NOTE   Sean Valencia  [id number] DOB: Sep 20, 1960 DOA: 11/01/2020 PCP: Patient, No Pcp Per (Inactive)  Brief Narrative:  60 year old black male HTN not on meds, prior diverticulosis with small tubular adenomas Several months persistent abdominal pain right upper quadrant epigastric area Presented initially 8/14 lipase is 67 LFTs slightly elevated CT abdomen showed acute pancreatitis but patient had to go home-represented 8/16 Found to have acute pancreatitis GI consulted MRCP showed moderate pancreatitis borderline CBD dilatation   Hospital-Problem based course  Acute gallstone pancreatitis No complicating features ERCP scheduled for a.m. and has been seen by general surgery already Trend LFTs a.m. HTN Poorly controlled Start amlodipine 10 mg   DVT prophylaxis: Lovenox Code Status: Full Family Communication: None Disposition:  Status is: Inpatient  Remains inpatient appropriate because:Hemodynamically unstable and Unsafe d/c plan  Dispo: The patient is from: Home              Anticipated d/c is to: Home              Patient currently is not medically stable to d/c.   Difficult to place patient No       Consultants:  General surgery GI  Procedures:   Antimicrobials:     Subjective: Pleasant awake alert Tells me ERCP had to be rescheduled No pain no fever no chills-he is hungry  Objective: Vitals:   11/03/20 0500 11/03/20 0530 11/03/20 0658 11/03/20 0908  BP:  (!) 173/88 (!) 173/83 (!) 172/86  Pulse:  81 81 85  Resp:  '18 18 16  '$ Temp:   98.7 F (37.1 C) 99 F (37.2 C)  TempSrc:  Oral Oral Oral  SpO2:  97% 99% 99%  Weight: 116.3 kg     Height:        Intake/Output Summary (Last 24 hours) at 11/03/2020 1315 Last data filed at 11/03/2020 1000 Gross per 24 hour  Intake 2672.35 ml  Output 1250 ml  Net 1422.35 ml   Filed Weights   11/02/20 1739 11/03/20 0500  Weight: 113.4 kg 116.3 kg    Examination:  Awake coherent alert no  distress EOMI NCAT no focal deficit CTA B no added sound no rales rhonchi Abdomen soft no rebound no guarding ROM intact  Data Reviewed: personally reviewed   CBC    Component Value Date/Time   WBC 11.9 (H) 11/03/2020 0428   RBC 4.42 11/03/2020 0428   HGB 12.7 (L) 11/03/2020 0428   HCT 38.4 (L) 11/03/2020 0428   PLT 247 11/03/2020 0428   MCV 86.9 11/03/2020 0428   MCV 88.5 06/14/2013 1113   MCH 28.7 11/03/2020 0428   MCHC 33.1 11/03/2020 0428   RDW 13.8 11/03/2020 0428   LYMPHSABS 2.0 11/02/2020 0541   MONOABS 1.1 (H) 11/02/2020 0541   EOSABS 0.2 11/02/2020 0541   BASOSABS 0.1 11/02/2020 0541   CMP Latest Ref Rng & Units 11/03/2020 11/02/2020 11/01/2020  Glucose 70 - 99 mg/dL 117(H) 111(H) 149(H)  BUN 6 - 20 mg/dL '8 10 9  '$ Creatinine 0.61 - 1.24 mg/dL 0.87 0.97 0.93  Sodium 135 - 145 mmol/L 140 141 137  Potassium 3.5 - 5.1 mmol/L 3.8 3.7 4.9  Chloride 98 - 111 mmol/L 105 106 103  CO2 22 - 32 mmol/L '27 26 26  '$ Calcium 8.9 - 10.3 mg/dL 9.0 9.6 9.8  Total Protein 6.5 - 8.1 g/dL 6.6 7.1 8.1  Total Bilirubin 0.3 - 1.2 mg/dL 1.9(H) 2.5(H) 2.7(H)  Alkaline Phos 38 - 126 U/L 151(H) 178(H) 214(H)  AST 15 - 41 U/L 20 29 41  ALT 0 - 44 U/L 60(H) 90(H) 119(H)     Radiology Studies: MR ABDOMEN MRCP WO CONTRAST  Result Date: 11/02/2020 CLINICAL DATA:  Worsening right upper quadrant and epigastric abdominal pain. Cholelithiasis. Acute pancreatitis. EXAM: MRI ABDOMEN WITHOUT CONTRAST  (INCLUDING MRCP) TECHNIQUE: Multiplanar multisequence MR imaging of the abdomen was performed. Heavily T2-weighted images of the biliary and pancreatic ducts were obtained, and three-dimensional MRCP images were rendered by post processing. COMPARISON:  CT on 10/31/2020 FINDINGS: Lower chest: No acute findings. Hepatobiliary: No masses visualized on this unenhanced exam. Multiple gallstones are seen, largest measuring 2.4 cm. No evidence of gallbladder wall thickening or pericholecystic inflammatory changes.  Borderline dilatation of common bile duct is seen measuring up to 6 mm. A few tiny calculi are seen in the distal common bile duct on images 24 - 28/series 12. Pancreas: Moderate edema is seen involving the pancreatic head and body with adjacent peripancreatic inflammatory changes, consistent with acute pancreatitis. Minimal peripancreatic fluid is seen in the retroperitoneum, however there is no evidence of pseudocyst. Spleen:  Within normal limits in size. Adrenals/Urinary tract: Unremarkable. No evidence of nephrolithiasis or hydronephrosis. Stomach/Bowel: Visualized portion unremarkable. Vascular/Lymphatic: No pathologically enlarged lymph nodes identified. No evidence of abdominal aortic aneurysm. Other:  None. Musculoskeletal:  No suspicious bone lesions identified. IMPRESSION: Moderate acute pancreatitis. No evidence of pseudocyst. Borderline dilatation of common bile duct, with tiny calculi in the distal common bile duct. Cholelithiasis. No radiographic evidence of cholecystitis. Electronically Signed   By: Marlaine Hind M.D.   On: 11/02/2020 08:10   MR 3D Recon At Scanner  Result Date: 11/02/2020 CLINICAL DATA:  Worsening right upper quadrant and epigastric abdominal pain. Cholelithiasis. Acute pancreatitis. EXAM: MRI ABDOMEN WITHOUT CONTRAST  (INCLUDING MRCP) TECHNIQUE: Multiplanar multisequence MR imaging of the abdomen was performed. Heavily T2-weighted images of the biliary and pancreatic ducts were obtained, and three-dimensional MRCP images were rendered by post processing. COMPARISON:  CT on 10/31/2020 FINDINGS: Lower chest: No acute findings. Hepatobiliary: No masses visualized on this unenhanced exam. Multiple gallstones are seen, largest measuring 2.4 cm. No evidence of gallbladder wall thickening or pericholecystic inflammatory changes. Borderline dilatation of common bile duct is seen measuring up to 6 mm. A few tiny calculi are seen in the distal common bile duct on images 24 - 28/series 12.  Pancreas: Moderate edema is seen involving the pancreatic head and body with adjacent peripancreatic inflammatory changes, consistent with acute pancreatitis. Minimal peripancreatic fluid is seen in the retroperitoneum, however there is no evidence of pseudocyst. Spleen:  Within normal limits in size. Adrenals/Urinary tract: Unremarkable. No evidence of nephrolithiasis or hydronephrosis. Stomach/Bowel: Visualized portion unremarkable. Vascular/Lymphatic: No pathologically enlarged lymph nodes identified. No evidence of abdominal aortic aneurysm. Other:  None. Musculoskeletal:  No suspicious bone lesions identified. IMPRESSION: Moderate acute pancreatitis. No evidence of pseudocyst. Borderline dilatation of common bile duct, with tiny calculi in the distal common bile duct. Cholelithiasis. No radiographic evidence of cholecystitis. Electronically Signed   By: Marlaine Hind M.D.   On: 11/02/2020 08:10   US Abdomen Limited RUQ (LIVER/GB)  Result Date: 11/02/2020 CLINICAL DATA:  Thickened gallbladder walls on recent CT examination EXAM: ULTRASOUND ABDOMEN LIMITED RIGHT UPPER QUADRANT COMPARISON:  CT from 10/31/2020 FINDINGS: Gallbladder: Gallbladder is well distended with evidence of cholelithiasis. Negative sonographic Murphy's sign is elicited. Mild wall thickening is noted likely related to incomplete distension. Common bile duct: Diameter: 3.7 mm. Liver: No focal lesion identified. Within normal limits  in parenchymal echogenicity. Portal vein is patent on color Doppler imaging with normal direction of blood flow towards the liver. Other: None. IMPRESSION: Cholelithiasis without definitive complicating factors. Negative sonographic Murphy's sign was elicited. Electronically Signed   By: Inez Catalina M.D.   On: 11/02/2020 01:34     Scheduled Meds: Continuous Infusions:  sodium chloride 250 mL (11/02/20 1757)   piperacillin-tazobactam (ZOSYN)  IV 3.375 g (11/03/20 0815)     LOS: 1 day   Time spent:  13  Nita Sells, MD [facility] To contact the attending provider between 7A-7P or the covering provider during after hours 7P-7A, please log into the web site [email or web address] and access using universal [facility] password for that web site. If you do not have the password, please call the hospital operator.  11/03/2020, 1:15 PM

## 2020-11-04 ENCOUNTER — Encounter (HOSPITAL_COMMUNITY)
Admission: EM | Disposition: A | Discharge status: Home / Self Care | Payer: Self-pay | Source: Home / Self Care | Attending: Family Medicine

## 2020-11-04 ENCOUNTER — Inpatient Hospital Stay (HOSPITAL_COMMUNITY): Payer: BC Managed Care – PPO

## 2020-11-04 ENCOUNTER — Inpatient Hospital Stay (HOSPITAL_COMMUNITY): Payer: BC Managed Care – PPO | Admitting: Certified Registered"

## 2020-11-04 HISTORY — PX: REMOVAL OF STONES: SHX5545

## 2020-11-04 HISTORY — PX: ENDOSCOPIC RETROGRADE CHOLANGIOPANCREATOGRAPHY (ERCP) WITH PROPOFOL: SHX5810

## 2020-11-04 HISTORY — PX: SPHINCTEROTOMY: SHX5544

## 2020-11-04 LAB — CBC WITH DIFFERENTIAL/PLATELET
Abs Immature Granulocytes: 0.2 10*3/uL — ABNORMAL HIGH (ref 0.00–0.07)
Basophils Absolute: 0.1 10*3/uL (ref 0.0–0.1)
Basophils Relative: 1 %
Eosinophils Absolute: 0.3 10*3/uL (ref 0.0–0.5)
Eosinophils Relative: 3 %
HCT: 38.4 % — ABNORMAL LOW (ref 39.0–52.0)
Hemoglobin: 12.8 g/dL — ABNORMAL LOW (ref 13.0–17.0)
Immature Granulocytes: 2 %
Lymphocytes Relative: 19 %
Lymphs Abs: 1.9 10*3/uL (ref 0.7–4.0)
MCH: 29.1 pg (ref 26.0–34.0)
MCHC: 33.3 g/dL (ref 30.0–36.0)
MCV: 87.3 fL (ref 80.0–100.0)
Monocytes Absolute: 0.7 10*3/uL (ref 0.1–1.0)
Monocytes Relative: 7 %
Neutro Abs: 6.7 10*3/uL (ref 1.7–7.7)
Neutrophils Relative %: 68 %
Platelets: 289 10*3/uL (ref 150–400)
RBC: 4.4 MIL/uL (ref 4.22–5.81)
RDW: 13.9 % (ref 11.5–15.5)
WBC: 9.8 10*3/uL (ref 4.0–10.5)
nRBC: 0 % (ref 0.0–0.2)

## 2020-11-04 LAB — COMPREHENSIVE METABOLIC PANEL
ALT: 46 U/L — ABNORMAL HIGH (ref 0–44)
AST: 19 U/L (ref 15–41)
Albumin: 2.8 g/dL — ABNORMAL LOW (ref 3.5–5.0)
Alkaline Phosphatase: 140 U/L — ABNORMAL HIGH (ref 38–126)
Anion gap: 7 (ref 5–15)
BUN: 6 mg/dL (ref 6–20)
CO2: 26 mmol/L (ref 22–32)
Calcium: 9.2 mg/dL (ref 8.9–10.3)
Chloride: 107 mmol/L (ref 98–111)
Creatinine, Ser: 0.98 mg/dL (ref 0.61–1.24)
GFR, Estimated: >60 mL/min (ref >60)
Glucose, Bld: 119 mg/dL — ABNORMAL HIGH (ref 70–99)
Potassium: 3.9 mmol/L (ref 3.5–5.1)
Sodium: 140 mmol/L (ref 135–145)
Total Bilirubin: 1.7 mg/dL — ABNORMAL HIGH (ref 0.3–1.2)
Total Protein: 6.5 g/dL (ref 6.5–8.1)

## 2020-11-04 LAB — LIPASE, BLOOD: Lipase: 33 U/L (ref 11–51)

## 2020-11-04 LAB — GLUCOSE, CAPILLARY
Glucose-Capillary: 109 mg/dL — ABNORMAL HIGH (ref 70–99)
Glucose-Capillary: 140 mg/dL — ABNORMAL HIGH (ref 70–99)

## 2020-11-04 IMAGING — RF DG ERCP WO/W SPHINCTEROTOMY
1 series · 9 of 9 positions shown · non-contrast
Comparison: MRI/MRCP [DATE]

CLINICAL DATA: Choledocholithiasis

Sphincterotomy and balloon sweep
EXAM:
ERCP
TECHNIQUE: Multiple spot images obtained with the fluoroscopic device and
submitted for interpretation post-procedure.
FLUOROSCOPY TIME:  Fluoroscopy Time:  45 seconds
Radiation Exposure Index (if provided by the fluoroscopic device):
Not provided
Number of Acquired Spot Images: 9

[Series 1: run · 9 of 9 slices shown]
[im 1/9]
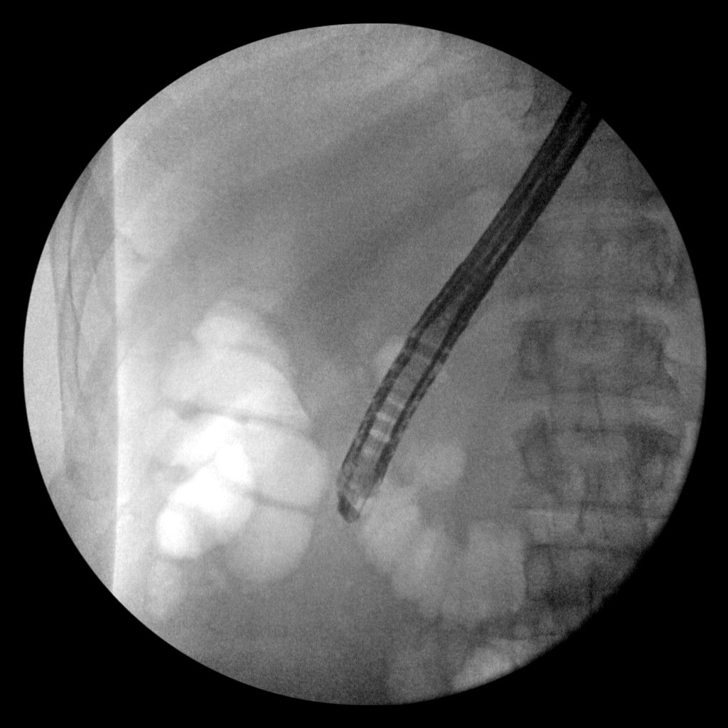
[im 2/9]
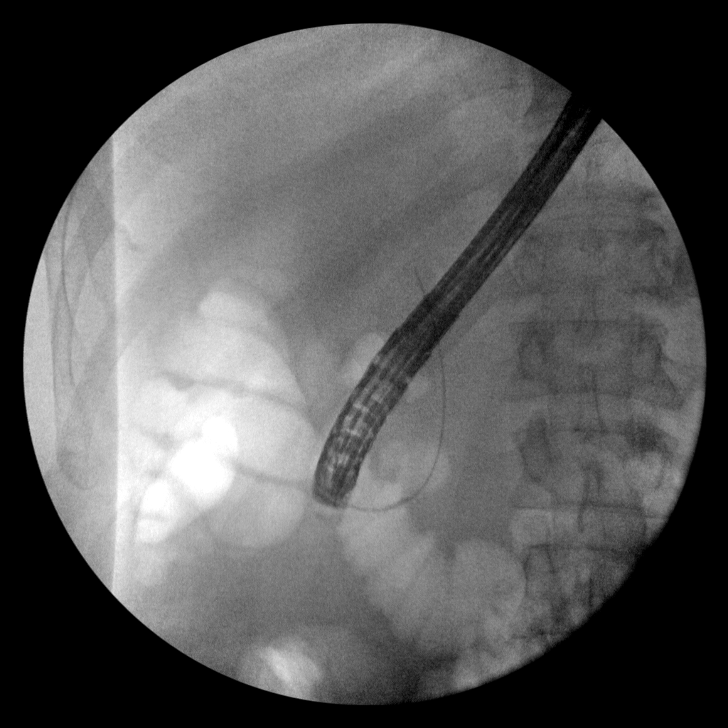
[im 3/9]
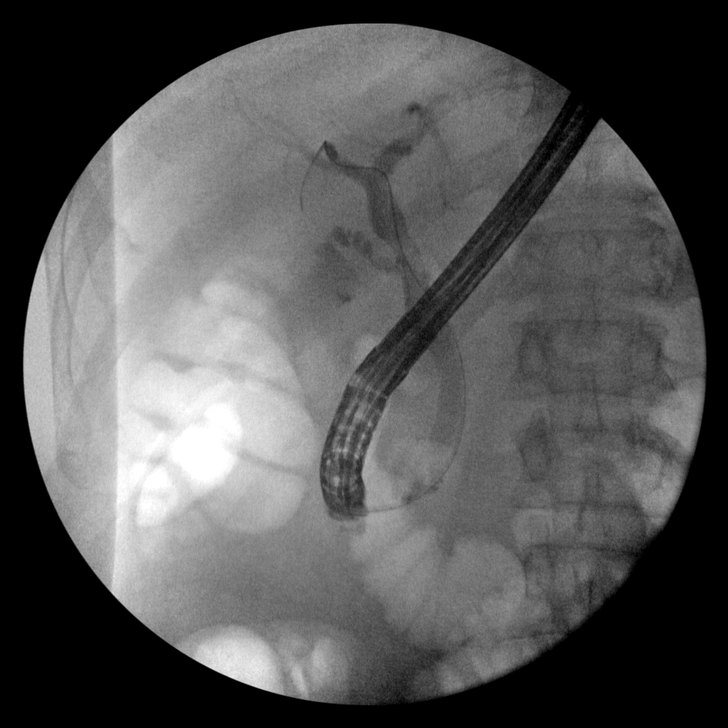
[im 4/9]
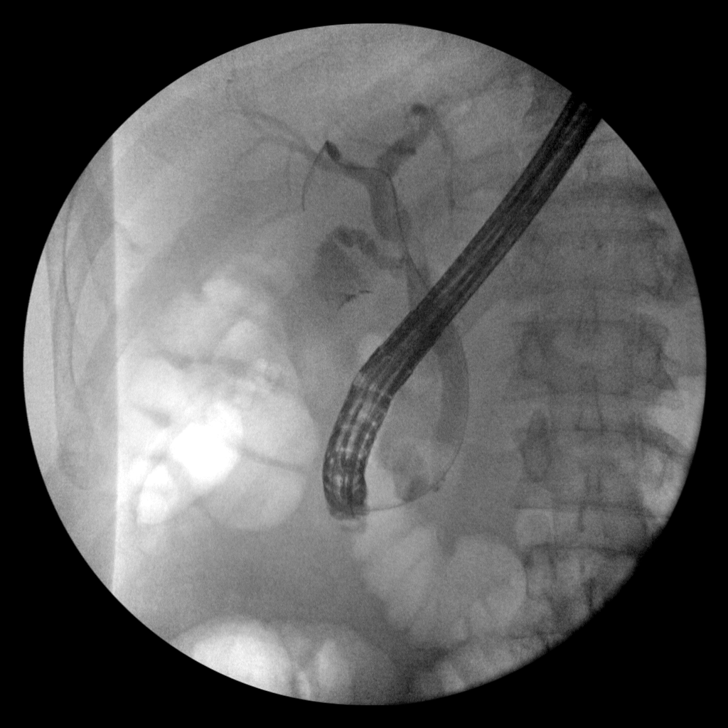
[im 5/9]
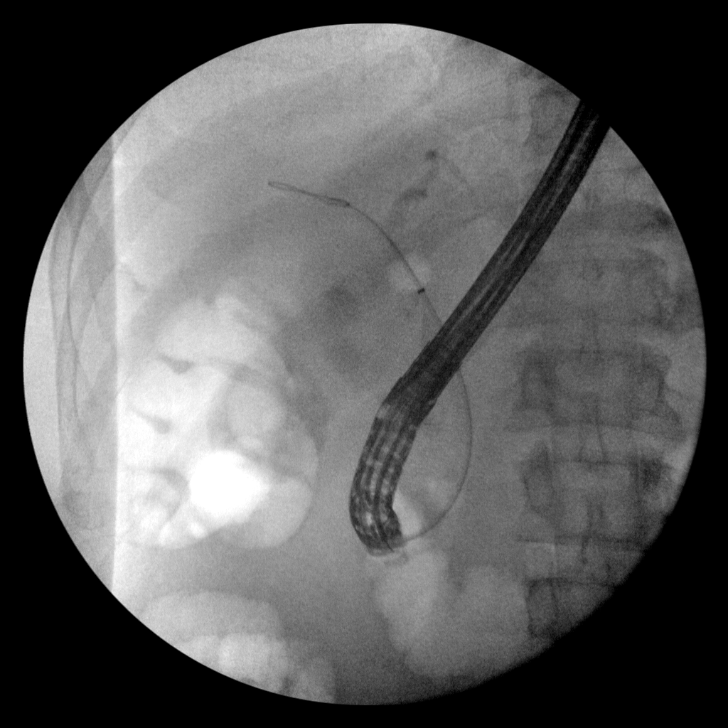
[im 6/9]
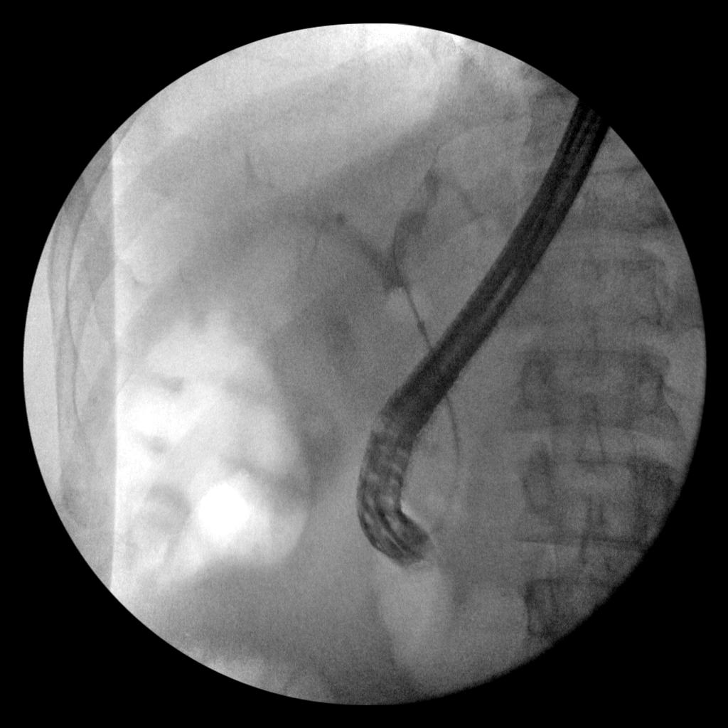
[im 7/9]
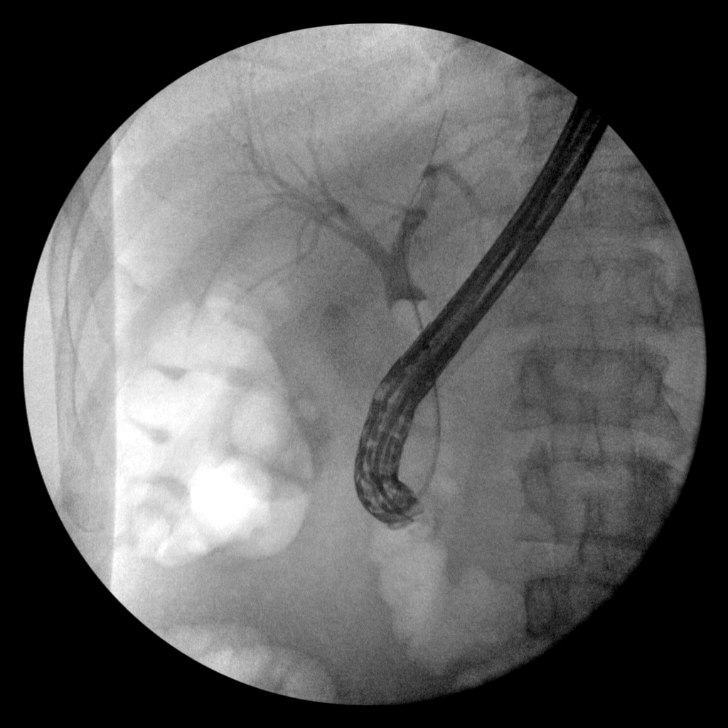
[im 8/9]
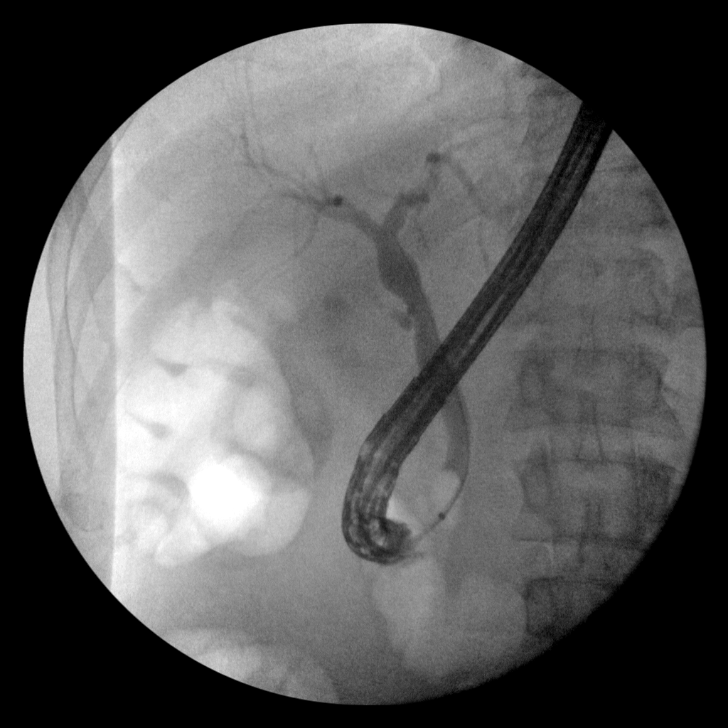
[im 9/9]
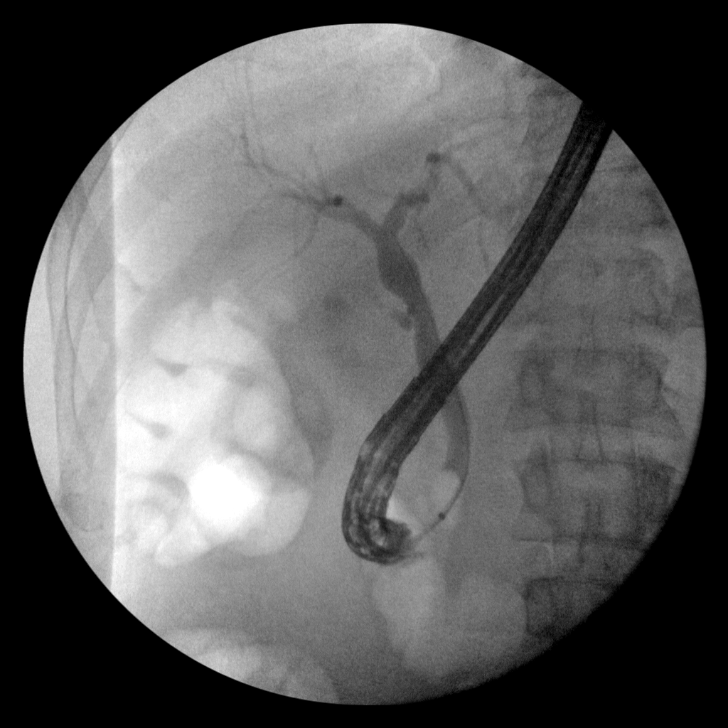

[9 of 9 positions shown; findings below may reference images not displayed]

FINDINGS: Submitted intraoperative fluoroscopic images demonstrate cannulation
and opacification of the intra and extrahepatic bile ducts. This is
followed by a balloon sweep of the CBD. No filling defects
identified within the common bile duct on the final provided image.
Evaluation of the mid duct is limited due to overlying scope.
IMPRESSION: Intraoperative fluoroscopic images of ERCP as above.

These images were submitted for radiologic interpretation only.
Please see the procedural report for the amount of contrast and the
fluoroscopy time utilized.

## 2020-11-04 SURGERY — ENDOSCOPIC RETROGRADE CHOLANGIOPANCREATOGRAPHY (ERCP) WITH PROPOFOL
Anesthesia: General

## 2020-11-04 MED ORDER — OXYCODONE HCL 5 MG/5ML PO SOLN
5.0000 mg | Freq: Once | ORAL | Status: DC | PRN
Start: 2020-11-04 — End: 2020-11-05

## 2020-11-04 MED ORDER — INDOMETHACIN 50 MG RE SUPP
RECTAL | Status: DC | PRN
  Administered 2020-11-04: 100 mg via RECTAL

## 2020-11-04 MED ORDER — ONDANSETRON HCL 4 MG/2ML IJ SOLN
INTRAMUSCULAR | Status: DC | PRN
Start: 2020-11-04 — End: 2020-11-04
  Administered 2020-11-04: 4 mg via INTRAVENOUS

## 2020-11-04 MED ORDER — GLUCAGON HCL RDNA (DIAGNOSTIC) 1 MG IJ SOLR
INTRAMUSCULAR | Status: AC
  Filled 2020-11-04: qty 1

## 2020-11-04 MED ORDER — ROCURONIUM BROMIDE 10 MG/ML (PF) SYRINGE
PREFILLED_SYRINGE | INTRAVENOUS | Status: DC | PRN
  Administered 2020-11-04: 80 mg via INTRAVENOUS

## 2020-11-04 MED ORDER — SUGAMMADEX SODIUM 200 MG/2ML IV SOLN
INTRAVENOUS | Status: DC | PRN
  Administered 2020-11-04: 500 mg via INTRAVENOUS

## 2020-11-04 MED ORDER — GLUCAGON HCL RDNA (DIAGNOSTIC) 1 MG IJ SOLR
INTRAMUSCULAR | Status: DC | PRN
  Administered 2020-11-04: .8 mg via INTRAVENOUS
  Administered 2020-11-04 (×2): .2 mg via INTRAVENOUS

## 2020-11-04 MED ORDER — DEXAMETHASONE SODIUM PHOSPHATE 10 MG/ML IJ SOLN
INTRAMUSCULAR | Status: DC | PRN
  Administered 2020-11-04: 8 mg via INTRAVENOUS

## 2020-11-04 MED ORDER — LIDOCAINE 2% (20 MG/ML) 5 ML SYRINGE
INTRAMUSCULAR | Status: DC | PRN
  Administered 2020-11-04: 100 mg via INTRAVENOUS

## 2020-11-04 MED ORDER — OXYCODONE HCL 5 MG PO TABS: 5.0000 mg | ORAL_TABLET | Freq: Once | ORAL | Status: DC | PRN

## 2020-11-04 MED ORDER — FENTANYL CITRATE (PF) 100 MCG/2ML IJ SOLN
INTRAMUSCULAR | Status: AC
  Filled 2020-11-04: qty 2

## 2020-11-04 MED ORDER — PROPOFOL 1000 MG/100ML IV EMUL
INTRAVENOUS | Status: AC
  Filled 2020-11-04: qty 500

## 2020-11-04 MED ORDER — FENTANYL CITRATE (PF) 100 MCG/2ML IJ SOLN
INTRAMUSCULAR | Status: DC | PRN
  Administered 2020-11-04 (×2): 50 ug via INTRAVENOUS

## 2020-11-04 MED ORDER — MIDAZOLAM HCL 2 MG/2ML IJ SOLN
INTRAMUSCULAR | Status: AC
  Filled 2020-11-04: qty 2

## 2020-11-04 MED ORDER — PROPOFOL 500 MG/50ML IV EMUL
INTRAVENOUS | Status: AC
  Filled 2020-11-04: qty 50

## 2020-11-04 MED ORDER — PROPOFOL 10 MG/ML IV BOLUS
INTRAVENOUS | Status: AC
  Filled 2020-11-04: qty 20

## 2020-11-04 MED ORDER — MIDAZOLAM HCL 5 MG/5ML IJ SOLN
INTRAMUSCULAR | Status: DC | PRN
  Administered 2020-11-04: 2 mg via INTRAVENOUS

## 2020-11-04 MED ORDER — PROPOFOL 10 MG/ML IV BOLUS
INTRAVENOUS | Status: DC | PRN
  Administered 2020-11-04: 200 mg via INTRAVENOUS

## 2020-11-04 MED ORDER — LACTATED RINGERS IV SOLN
INTRAVENOUS | Status: AC | PRN
  Administered 2020-11-04: 1000 mL via INTRAVENOUS

## 2020-11-04 MED ORDER — PROMETHAZINE HCL 25 MG/ML IJ SOLN: 6.2500 mg | INTRAMUSCULAR | Status: DC | PRN

## 2020-11-04 MED ORDER — FENTANYL CITRATE (PF) 100 MCG/2ML IJ SOLN: 25.0000 ug | INTRAMUSCULAR | Status: DC | PRN

## 2020-11-04 MED ORDER — INDOMETHACIN 50 MG RE SUPP
RECTAL | Status: AC
  Filled 2020-11-04: qty 2

## 2020-11-04 NOTE — Progress Notes (Signed)
[facility] Surgery Consult Note  ROCIO ROME June 25, 1960  [id number].    Requesting MD: Nita Sells Chief Complaint: hematuria and abdominal pain Reason for Consult: gallbladder pancreatitis  HPI:  Pt is a 60 y/o male who presented to the ED 10/31/20, after a visit to an Urgent Care.  He complained of abdominal pain and UA showed some hematuria.  He was referred to the ED at that point.  Initial US showed a 2.3 cm gallstone, GB wall thickness of 2-3 mm, no pericholecystic fluid, and a 5 mm CBD.  CT 10/31/20 showed acute pancreatitis with no evidence of necrosis, thickening of the duodenum adjacent to the peripancreatic inflammation, colonic diverticulosis without diverticulitis.  Small inguinal hernia.  Pt had to leave for personal reasons at that point and returned to the ED on 11/02/20.  [place] on 11/02/20, shows a distended gallbladder with cholelithiasis, CBD 3.38m. MRI shows:  Moderate acute pancreatitis. No evidence of pseudocyst.  Borderline dilatation of common bile duct, with tiny calculi in the distal common bile duct. Cholelithiasis. No radiographic evidence of cholecystitis. Labs:  shows a normal lipase 8/17 & 8/18. T bil 2.5>>1.9>>1.7 AST 29>>20>>19; ALT 90 >>60>>46.  WBC 17.4>>11.9>>9.8   Pt was admitted and was seen by GI.  He underwent ERCP, sphincterotomy, and balloon extraction of all stones. Post ERCP he is without pain.  We are ask to see.    ROS: Review of Systems  Constitutional: Negative.   HENT: Negative.    Eyes: Negative.   Respiratory: Negative.    Cardiovascular: Negative.   Gastrointestinal:  Positive for abdominal pain.  Genitourinary: Negative.   Musculoskeletal: Negative.   Skin: Negative.   Neurological: Negative.   Endo/Heme/Allergies: Negative.   Psychiatric/Behavioral: Negative.     Family History  Problem Relation Age of Onset   Diabetes Father    Hypertension Father    Bone cancer Maternal Grandfather    Diabetes Paternal  Grandmother    Diabetes Paternal Grandfather    Leukemia Paternal Grandfather    Hypercalcemia Brother    Colon cancer Neg Hx    Esophageal cancer Neg Hx    Stomach cancer Neg Hx    Rectal cancer Neg Hx     Past Medical History:  Diagnosis Date   Allergy    Anal fistula    Arthritis    History of kidney stones    HTN (hypertension)     Past Surgical History:  Procedure Laterality Date   INGUINAL HERNIA REPAIR Bilateral 1992    Social History:  reports that he has never smoked. He has never used smokeless tobacco. He reports current alcohol use. He reports that he does not use drugs.  Allergies: No Known Allergies  Facility-Administered Medications Prior to Admission  Medication Dose Route Frequency Provider Last Rate Last Admin   0.9 %  sodium chloride infusion  500 mL Intravenous Once Nandigam, Kavitha V, MD       No medications prior to admission.    Blood pressure (!) 159/95, pulse 85, temperature 99 F (37.2 C), temperature source Oral, resp. rate 16, height '5\' 11"'$  (1.803 m), weight 116.3 kg, SpO2 97 %. Physical Exam:  General: pleasant, WD, WN AA male who is laying in bed in NAD HEENT: head is normocephalic, atraumatic.  Sclera are noninjected.  Pupils are equal.  Ears and nose without any masses or lesions.  Mouth is pink and moist Heart: regular, rate, and rhythm.  Normal s1,s2. No obvious murmurs, gallops, or rubs noted.  Palpable radial and pedal pulses bilaterally Lungs: CTAB, no wheezes, rhonchi, or rales noted.  Respiratory effort nonlabored Abd: soft, NT, ND, +BS, no masses, hernias, or organomegaly MS: all 4 extremities are symmetrical with no cyanosis, clubbing, or edema. Skin: warm and dry with no masses, lesions, or rashes Neuro: Cranial nerves 2-12 grossly intact, sensation is normal throughout Psych: A&Ox3 with an appropriate affect.   Results for orders placed or performed during the hospital encounter of 11/01/20 (from the past 48 hour(s))   Glucose, capillary     Status: Abnormal   Collection Time: 11/03/20 12:34 AM  Result Value Ref Range   Glucose-Capillary 128 (H) 70 - 99 mg/dL    Comment: Glucose reference range applies only to samples taken after fasting for at least 8 hours.  Comprehensive metabolic panel     Status: Abnormal   Collection Time: 11/03/20  4:28 AM  Result Value Ref Range   Sodium 140 135 - 145 mmol/L   Potassium 3.8 3.5 - 5.1 mmol/L   Chloride 105 98 - 111 mmol/L   CO2 27 22 - 32 mmol/L   Glucose, Bld 117 (H) 70 - 99 mg/dL    Comment: Glucose reference range applies only to samples taken after fasting for at least 8 hours.   BUN 8 6 - 20 mg/dL   Creatinine, Ser 0.87 0.61 - 1.24 mg/dL   Calcium 9.0 8.9 - 10.3 mg/dL   Total Protein 6.6 6.5 - 8.1 g/dL   Albumin 2.7 (L) 3.5 - 5.0 g/dL   AST 20 15 - 41 U/L   ALT 60 (H) 0 - 44 U/L   Alkaline Phosphatase 151 (H) 38 - 126 U/L   Total Bilirubin 1.9 (H) 0.3 - 1.2 mg/dL   GFR, Estimated >60 >60 mL/min    Comment: (NOTE) Calculated using the CKD-EPI Creatinine Equation (2021)    Anion gap 8 5 - 15    Comment: Performed at [facility], [place] [address] [place], [place] [address]  CBC     Status: Abnormal   Collection Time: 11/03/20  4:28 AM  Result Value Ref Range   WBC 11.9 (H) 4.0 - 10.5 K/uL   RBC 4.42 4.22 - 5.81 MIL/uL   Hemoglobin 12.7 (L) 13.0 - 17.0 g/dL   HCT 38.4 (L) 39.0 - 52.0 %   MCV 86.9 80.0 - 100.0 fL   MCH 28.7 26.0 - 34.0 pg   MCHC 33.1 30.0 - 36.0 g/dL   RDW 13.8 11.5 - 15.5 %   Platelets 247 150 - 400 K/uL   nRBC 0.0 0.0 - 0.2 %    Comment: Performed at [facility], [place] [address] [place], [place] [address]  Lipase, blood     Status: None   Collection Time: 11/03/20  4:28 AM  Result Value Ref Range   Lipase 30 11 - 51 U/L    Comment: Performed at [facility], [place] [address], [place], [place] [address]  Glucose, capillary     Status: Abnormal   Collection Time:  11/03/20  7:57 AM  Result Value Ref Range   Glucose-Capillary 109 (H) 70 - 99 mg/dL    Comment: Glucose reference range applies only to samples taken after fasting for at least 8 hours.  Glucose, capillary     Status: Abnormal   Collection Time: 11/03/20  3:47 PM  Result Value Ref Range   Glucose-Capillary 151 (H) 70 - 99 mg/dL    Comment: Glucose reference  range applies only to samples taken after fasting for at least 8 hours.  Glucose, capillary     Status: Abnormal   Collection Time: 11/03/20  9:48 PM  Result Value Ref Range   Glucose-Capillary 154 (H) 70 - 99 mg/dL    Comment: Glucose reference range applies only to samples taken after fasting for at least 8 hours.   Comment 1 Notify RN    Comment 2 Document in Chart   CBC with Differential/Platelet     Status: Abnormal   Collection Time: 11/04/20  5:39 AM  Result Value Ref Range   WBC 9.8 4.0 - 10.5 K/uL   RBC 4.40 4.22 - 5.81 MIL/uL   Hemoglobin 12.8 (L) 13.0 - 17.0 g/dL   HCT 38.4 (L) 39.0 - 52.0 %   MCV 87.3 80.0 - 100.0 fL   MCH 29.1 26.0 - 34.0 pg   MCHC 33.3 30.0 - 36.0 g/dL   RDW 13.9 11.5 - 15.5 %   Platelets 289 150 - 400 K/uL   nRBC 0.0 0.0 - 0.2 %   Neutrophils Relative % 68 %   Neutro Abs 6.7 1.7 - 7.7 K/uL   Lymphocytes Relative 19 %   Lymphs Abs 1.9 0.7 - 4.0 K/uL   Monocytes Relative 7 %   Monocytes Absolute 0.7 0.1 - 1.0 K/uL   Eosinophils Relative 3 %   Eosinophils Absolute 0.3 0.0 - 0.5 K/uL   Basophils Relative 1 %   Basophils Absolute 0.1 0.0 - 0.1 K/uL   Immature Granulocytes 2 %   Abs Immature Granulocytes 0.20 (H) 0.00 - 0.07 K/uL    Comment: Performed at [facility], [place] [address] [place], [place] [address]  Comprehensive metabolic panel     Status: Abnormal   Collection Time: 11/04/20  5:39 AM  Result Value Ref Range   Sodium 140 135 - 145 mmol/L   Potassium 3.9 3.5 - 5.1 mmol/L   Chloride 107 98 - 111 mmol/L   CO2 26 22 - 32 mmol/L   Glucose, Bld 119 (H) 70 - 99  mg/dL    Comment: Glucose reference range applies only to samples taken after fasting for at least 8 hours.   BUN 6 6 - 20 mg/dL   Creatinine, Ser 0.98 0.61 - 1.24 mg/dL   Calcium 9.2 8.9 - 10.3 mg/dL   Total Protein 6.5 6.5 - 8.1 g/dL   Albumin 2.8 (L) 3.5 - 5.0 g/dL   AST 19 15 - 41 U/L   ALT 46 (H) 0 - 44 U/L   Alkaline Phosphatase 140 (H) 38 - 126 U/L   Total Bilirubin 1.7 (H) 0.3 - 1.2 mg/dL   GFR, Estimated >60 >60 mL/min    Comment: (NOTE) Calculated using the CKD-EPI Creatinine Equation (2021)    Anion gap 7 5 - 15    Comment: Performed at [facility], [place] [address], [place], [place] [address]  Lipase, blood     Status: None   Collection Time: 11/04/20  5:39 AM  Result Value Ref Range   Lipase 33 11 - 51 U/L    Comment: Performed at [facility], [place] [address], [place],  [address]  Glucose, capillary     Status: Abnormal   Collection Time: 11/04/20  8:41 AM  Result Value Ref Range   Glucose-Capillary 109 (H) 70 - 99 mg/dL    Comment: Glucose reference range applies only to samples taken after fasting for at least 8 hours.  DG ERCP BILIARY & PANCREATIC DUCTS  Result Date: 11/04/2020 CLINICAL DATA:  Choledocholithiasis Sphincterotomy and balloon sweep EXAM: ERCP TECHNIQUE: Multiple spot images obtained with the fluoroscopic device and submitted for interpretation post-procedure. FLUOROSCOPY TIME:  Fluoroscopy Time:  45 seconds Radiation Exposure Index (if provided by the fluoroscopic device): Not provided Number of Acquired Spot Images: 9 COMPARISON:  MRI/MRCP 11/02/2020 FINDINGS: Submitted intraoperative fluoroscopic images demonstrate cannulation and opacification of the intra and extrahepatic bile ducts. This is followed by a balloon sweep of the CBD. No filling defects identified within the common bile duct on the final provided image. Evaluation of the mid duct is limited due to overlying scope. IMPRESSION: Intraoperative  fluoroscopic images of ERCP as above. These images were submitted for radiologic interpretation only. Please see the procedural report for the amount of contrast and the fluoroscopy time utilized. Electronically Signed   By: Miachel Roux M.D.   On: 11/04/2020 13:27      Assessment/Plan Gallstone pancreatitis  - will recheck labs in AM and if OK plan surgery tomorrow.  Risk and benefits discussed in detail with him and his wife.  They are in agreement with the plan.   FEN:  clears/IV fluids ID:  [email or web address] 8/16 >> day 3 DVT: SCD's  Hypertension  - on a OTC supplement at home  Earnstine [facility] Surgery 11/04/2020, 2:00 PM Please see Amion for pager number during day hours 7:00am-4:30pm

## 2020-11-04 NOTE — Progress Notes (Addendum)
[facility] Progress Note  CC:  Pancreatitis, choledocholithiasis  Subjective: He feels well this morning.  No nausea or vomiting.  No abdominal pain.  No chest pain or shortness of breath.  He remains NPO for ERCP this afternoon.  Objective:  Vital signs in last 24 hours: Temp:  [98.3 F (36.8 C)-99 F (37.2 C)] 98.3 F (36.8 C) (08/18 0952) Pulse Rate:  [68-77] 74 (08/18 0952) Resp:  [16-20] 16 (08/18 0952) BP: (150-165)/(90-96) 163/94 (08/18 0952) SpO2:  [99 %-100 %] 99 % (08/18 0952) Last BM Date: 11/01/20  General:   Alert in no acute distress. Heart: Regular rate and rhythm, no murmurs. Pulm: Breath sounds clear throughout. Abdomen: Soft, nondistended.  Nontender.  Positive bowel sounds to all 4 quadrants. Extremities:  Without edema. Neurologic:  Alert and  oriented x4;  grossly normal neurologically. Psych:  Alert and cooperative. Normal mood and affect.  Intake/Output from previous day: 08/17 0701 - 08/18 0700 In: 437.1 [P.O.:240; I.V.:134.8; IV Piggyback:62.4] Out: 1900 [Urine:1900] Intake/Output this shift: Total I/O In: 0  Out: 400 [Urine:400]  Lab Results: Recent Labs    11/02/20 0541 11/03/20 0428 11/04/20 0539  WBC 17.4* 11.9* 9.8  HGB 13.3 12.7* 12.8*  HCT 39.7 38.4* 38.4*  PLT 208 247 289   BMET Recent Labs    11/02/20 0541 11/03/20 0428 11/04/20 0539  NA 141 140 140  K 3.7 3.8 3.9  CL 106 105 107  CO2 '26 27 26  ' GLUCOSE 111* 117* 119*  BUN '10 8 6  ' CREATININE 0.97 0.87 0.98  CALCIUM 9.6 9.0 9.2   LFT Recent Labs    11/02/20 0541 11/03/20 0428 11/04/20 0539  PROT 7.1   < > 6.5  ALBUMIN 3.3*   < > 2.8*  AST 29   < > 19  ALT 90*   < > 46*  ALKPHOS 178*   < > 140*  BILITOT 2.5*   < > 1.7*  BILIDIR 0.9*  --   --   IBILI 1.6*  --   --    < > = values in this interval not displayed.   PT/INR No results for input(s): LABPROT, INR in the last 72 hours. Hepatitis Panel No results for input(s): HEPBSAG, HCVAB,  HEPAIGM, HEPBIGM in the last 72 hours.  No results found.  Assessment / Plan:  85) 60 year old male admitted to the hospital 10/31/2020 secondary to epigastric pain and elevated LFTs. RUQ sono showed a 2.3 cm gallstone without evidence of cholecystitis or biliary ductal dilatation.  CTAP was consistent with acute pancreatitis without evidence of gallstones or choledocholithiasis.  A repeat RUQ was done today due to worsening upper abdominal pain which again showed a gallstone without evidence of acute cholecystitis.  An abdominal MRI/MRCP without contrast was done which identified acute pancreatitis without evidence of a pseudocyst and borderline dilatation of the CBD with a tiny calculi in the distal CBD. Alk phos 241 -> 178 -> 140. T. Bili 1.6 -> 1.7. AST 41 -> 29 -> 19. ALT 119 -> 90 -> 46. Lipase 67 -> 34 -> 33.  Triglyceride level pending. WBC  20.5 -> 17.4. He is afebrile. On Zosyn IV.  No current N/V or abdominal pain.  -NPO -Proceed with ERCP as scheduled this afternoon   2) History of tubular adenomatous colon polyps per colonoscopy 07/2020 -Next colonoscopy due 07/2023   3) History of an anal fistula -Schedule outpatient evaluation with general surgery as previously recommended    4)  History of diverticulitis     Principal Problem:   Acute pancreatitis Active Problems:   Elevated blood pressure reading   Gallstones   Gallstone pancreatitis     LOS: 2 days   Noralyn Pick  11/04/2020, 11:04 AM   Attending physician's note   I have taken an interval history, reviewed the chart and examined the patient. I agree with the Advanced Practitioner's note, impression and recommendations.   I have explained risks and benefits including small but definite risks of pancreatitis (<10%), bleeding (<1%), perforation (<1%).  The risks of general anesthesia were also discussed.  The benefits were also discussed.  Patient wishes to proceed.  All the questions were answered.   Carmell Austria, MD [facility] [phone]

## 2020-11-04 NOTE — Op Note (Signed)
[facility] Patient Name: Sean Valencia Procedure Date: 11/04/2020 MRN: [id number] Attending MD: Jackquline Denmark , MD Date of Birth: 1960-08-07 CSN: [id number] Age: 60 Admit Type: Outpatient Procedure:                ERCP Indications:              Bile duct stone(s) on MRCP. Gallstone pancreatitis. Providers:                Jackquline Denmark, MD, Glori Bickers, RN, Cletis Athens,                            Technician, Cleda Daub, CRNA Referring MD:              Medicines:                General Anesthesia Complications:            No immediate complications. Estimated Blood Loss:     Estimated blood loss: none. Procedure:                Pre-Anesthesia Assessment:                           - Prior to the procedure, a History and Physical                            was performed, and patient medications and                            allergies were reviewed. The patient's tolerance of                            previous anesthesia was also reviewed. The risks                            and benefits of the procedure and the sedation                            options and risks were discussed with the patient.                            All questions were answered, and informed consent                            was obtained. Prior Anticoagulants: The patient has                            taken no previous anticoagulant or antiplatelet                            agents. ASA Grade Assessment: II - A patient with                            mild systemic disease. After reviewing the risks  and benefits, the patient was deemed in                            satisfactory condition to undergo the procedure.                           After obtaining informed consent, the scope was                            passed under direct vision. Throughout the                            procedure, the patient's blood pressure, pulse, and                            oxygen  saturations were monitored continuously. The                            [employer] single use                            duodenoscope was introduced through the mouth, and                            used to inject contrast into and used to inject                            contrast into the bile duct. The ERCP was                            accomplished without difficulty. The patient                            tolerated the procedure well. Scope In: Scope Out: Findings:      The scout film was normal. The esophagus was successfully intubated       under direct vision. The scope was advanced to a major papilla in the       descending duodenum without detailed examination of the pharynx, larynx       and associated structures, and upper GI tract. The upper GI tract was       grossly normal.      A large 1.5 cm periampullary diverticulum was noted with papillary       opening at the lower lip. The bile duct was deeply cannulated with the       short-nosed traction sphincterotome. Contrast was injected. I personally       interpreted the bile duct images. Ductal flow of contrast was adequate.       Image quality was adequate. Contrast extended to the entire biliary tree.      A single 6 mm CBD stone was noted in mildly dilated CBD at 8 mm. The       right and left hepatic ducts were normal. Cystic duct was patent.       Multiple filling defects consistent with cholelithiasis. An 8 mm biliary       sphincterotomy was  made with a monofilament traction (standard)       sphincterotome using ERBE electrocautery using endocut mode. There was       no post-sphincterotomy bleeding. The biliary tree was swept with a 12 mm       balloon starting at the bifurcation. All stones were removed.       Postocclusion cholangiogram did not reveal any residual filling defects.      Pancreatic duct was never cannulated or injected. Impression:               - Choledocholithiasis s/p biliary  sphincterotomy                            and balloon extraction.                           - Cholelithiasis. Moderate Sedation:      Not Applicable - Patient had care per Anesthesia. Recommendation:           - Return patient to hospital ward for ongoing care.                           - Trend CBC, CMP.                           - Clear liquid diet. Would keep him n.p.o. after                            midnight.                           - Recommend laparoscopic cholecystectomy.                           - Watch for pancreatitis, bleeding, perforation,                            and cholangitis.                           - The findings and recommendations were discussed                            with the patient's wife Tamela Oddi. Procedure Code(s):        --- Professional ---                           [phone], Endoscopic retrograde                            cholangiopancreatography (ERCP); with removal of                            calculi/debris from biliary/pancreatic duct(s)                           [id number], Endoscopic retrograde  cholangiopancreatography (ERCP); with                            sphincterotomy/papillotomy                           [phone], Endoscopic catheterization of the biliary                            ductal system, radiological supervision and                            interpretation Diagnosis Code(s):        --- Professional ---                           K80.51, Calculus of bile duct without cholangitis                            or cholecystitis with obstruction CPT copyright 2019 [employer]. All rights reserved. The codes documented in this report are preliminary and upon coder review may  be revised to meet current compliance requirements. Jackquline Denmark, MD 11/04/2020 1:22:19 PM This report has been signed electronically. Number of Addenda: 0

## 2020-11-04 NOTE — Progress Notes (Signed)
Patient up ambulating half of the hall. Does not report any pain.

## 2020-11-04 NOTE — Consult Note (Signed)
[facility] Surgery Consult Note   Sean Valencia 05/15/1960  UR:[id number].     Requesting MD: Nita Sells Chief Complaint: hematuria and abdominal pain Reason for Consult: gallbladder pancreatitis   HPI:  Pt is a 60 y/o male who presented to the ED 10/31/20, after a visit to an Urgent Care.  He complained of abdominal pain and UA showed some hematuria.  He was referred to the ED at that point.  Initial US showed a 2.3 cm gallstone, GB wall thickness of 2-3 mm, no pericholecystic fluid, and a 5 mm CBD.  CT 10/31/20 showed acute pancreatitis with no evidence of necrosis, thickening of the duodenum adjacent to the peripancreatic inflammation, colonic diverticulosis without diverticulitis.  Small inguinal hernia.  Pt had to leave for personal reasons at that point and returned to the ED on 11/02/20.   [place] on 11/02/20, shows a distended gallbladder with cholelithiasis, CBD 3.83m. MRI shows:  Moderate acute pancreatitis. No evidence of pseudocyst.  Borderline dilatation of common bile duct, with tiny calculi in the distal common bile duct. Cholelithiasis. No radiographic evidence of cholecystitis. Labs:  shows a normal lipase 8/17 & 8/18. T bil 2.5>>1.9>>1.7 AST 29>>20>>19; ALT 90 >>60>>46.  WBC 17.4>>11.9>>9.8   Pt was admitted and was seen by GI.  He underwent ERCP, sphincterotomy, and balloon extraction of all stones. Post ERCP he is without pain.   We are ask to see.     ROS: Review of Systems  Constitutional: Negative.   HENT: Negative.    Eyes: Negative.   Respiratory: Negative.    Cardiovascular: Negative.   Gastrointestinal:  Positive for abdominal pain.  Genitourinary: Negative.   Musculoskeletal: Negative.   Skin: Negative.   Neurological: Negative.   Endo/Heme/Allergies: Negative.   Psychiatric/Behavioral: Negative.           Family History  Problem Relation Age of Onset   Diabetes Father     Hypertension Father     Bone cancer Maternal Grandfather     Diabetes  Paternal Grandmother     Diabetes Paternal Grandfather     Leukemia Paternal Grandfather     Hypercalcemia Brother     Colon cancer Neg Hx     Esophageal cancer Neg Hx     Stomach cancer Neg Hx     Rectal cancer Neg Hx            Past Medical History:  Diagnosis Date   Allergy     Anal fistula     Arthritis     History of kidney stones     HTN (hypertension)             Past Surgical History:  Procedure Laterality Date   INGUINAL HERNIA REPAIR Bilateral 1992      Social History:  reports that he has never smoked. He has never used smokeless tobacco. He reports current alcohol use. He reports that he does not use drugs.  Married with 5 children  Allergies: No Known Allergies            Facility-Administered Medications Prior to Admission  Medication Dose Route Frequency Provider Last Rate Last Admin   0.9 %  sodium chloride infusion  500 mL Intravenous Once Nandigam, Kavitha V, MD        No medications prior to admission.      Blood pressure (!) 159/95, pulse 85, temperature 99 F (37.2 C), temperature source Oral, resp. rate 16, height '5\' 11"'$  (1.803 m), weight 116.3 kg, SpO2 97 %.  Physical Exam:  General: pleasant, WD, WN AA male who is laying in bed in NAD HEENT: head is normocephalic, atraumatic.  Sclera are noninjected.  Pupils are equal.  Ears and nose without any masses or lesions.  Mouth is pink and moist Heart: regular, rate, and rhythm.  Normal s1,s2. No obvious murmurs, gallops, or rubs noted.  Palpable radial and pedal pulses bilaterally Lungs: CTAB, no wheezes, rhonchi, or rales noted.  Respiratory effort nonlabored Abd: soft, NT, ND, +BS, no masses, hernias, or organomegaly MS: all 4 extremities are symmetrical with no cyanosis, clubbing, or edema. Skin: warm and dry with no masses, lesions, or rashes Neuro: Cranial nerves 2-12 grossly intact, sensation is normal throughout Psych: A&Ox3 with an appropriate affect.    Lab Results Last 48 Hours         Results for orders placed or performed during the hospital encounter of 11/01/20 (from the past 48 hour(s))  Glucose, capillary     Status: Abnormal    Collection Time: 11/03/20 12:34 AM  Result Value Ref Range    Glucose-Capillary 128 (H) 70 - 99 mg/dL      Comment: Glucose reference range applies only to samples taken after fasting for at least 8 hours.  Comprehensive metabolic panel     Status: Abnormal    Collection Time: 11/03/20  4:28 AM  Result Value Ref Range    Sodium 140 135 - 145 mmol/L    Potassium 3.8 3.5 - 5.1 mmol/L    Chloride 105 98 - 111 mmol/L    CO2 27 22 - 32 mmol/L    Glucose, Bld 117 (H) 70 - 99 mg/dL      Comment: Glucose reference range applies only to samples taken after fasting for at least 8 hours.    BUN 8 6 - 20 mg/dL    Creatinine, Ser 0.87 0.61 - 1.24 mg/dL    Calcium 9.0 8.9 - 10.3 mg/dL    Total Protein 6.6 6.5 - 8.1 g/dL    Albumin 2.7 (L) 3.5 - 5.0 g/dL    AST 20 15 - 41 U/L    ALT 60 (H) 0 - 44 U/L    Alkaline Phosphatase 151 (H) 38 - 126 U/L    Total Bilirubin 1.9 (H) 0.3 - 1.2 mg/dL    GFR, Estimated >60 >60 mL/min      Comment: (NOTE) Calculated using the CKD-EPI Creatinine Equation (2021)      Anion gap 8 5 - 15      Comment: Performed at [facility], [place] [address] [place], [place] [address]  CBC     Status: Abnormal    Collection Time: 11/03/20  4:28 AM  Result Value Ref Range    WBC 11.9 (H) 4.0 - 10.5 K/uL    RBC 4.42 4.22 - 5.81 MIL/uL    Hemoglobin 12.7 (L) 13.0 - 17.0 g/dL    HCT 38.4 (L) 39.0 - 52.0 %    MCV 86.9 80.0 - 100.0 fL    MCH 28.7 26.0 - 34.0 pg    MCHC 33.1 30.0 - 36.0 g/dL    RDW 13.8 11.5 - 15.5 %    Platelets 247 150 - 400 K/uL    nRBC 0.0 0.0 - 0.2 %      Comment: Performed at [facility], [place] [address], [place], [place] [address]  Lipase, blood     Status: None    Collection Time: 11/03/20  4:28 AM  Result Value  Ref Range    Lipase 30 11 - 51 U/L      Comment:  Performed at [facility], [place] [address], [place], [place] [address]  Glucose, capillary     Status: Abnormal    Collection Time: 11/03/20  7:57 AM  Result Value Ref Range    Glucose-Capillary 109 (H) 70 - 99 mg/dL      Comment: Glucose reference range applies only to samples taken after fasting for at least 8 hours.  Glucose, capillary     Status: Abnormal    Collection Time: 11/03/20  3:47 PM  Result Value Ref Range    Glucose-Capillary 151 (H) 70 - 99 mg/dL      Comment: Glucose reference range applies only to samples taken after fasting for at least 8 hours.  Glucose, capillary     Status: Abnormal    Collection Time: 11/03/20  9:48 PM  Result Value Ref Range    Glucose-Capillary 154 (H) 70 - 99 mg/dL      Comment: Glucose reference range applies only to samples taken after fasting for at least 8 hours.    Comment 1 Notify RN      Comment 2 Document in Chart    CBC with Differential/Platelet     Status: Abnormal    Collection Time: 11/04/20  5:39 AM  Result Value Ref Range    WBC 9.8 4.0 - 10.5 K/uL    RBC 4.40 4.22 - 5.81 MIL/uL    Hemoglobin 12.8 (L) 13.0 - 17.0 g/dL    HCT 38.4 (L) 39.0 - 52.0 %    MCV 87.3 80.0 - 100.0 fL    MCH 29.1 26.0 - 34.0 pg    MCHC 33.3 30.0 - 36.0 g/dL    RDW 13.9 11.5 - 15.5 %    Platelets 289 150 - 400 K/uL    nRBC 0.0 0.0 - 0.2 %    Neutrophils Relative % 68 %    Neutro Abs 6.7 1.7 - 7.7 K/uL    Lymphocytes Relative 19 %    Lymphs Abs 1.9 0.7 - 4.0 K/uL    Monocytes Relative 7 %    Monocytes Absolute 0.7 0.1 - 1.0 K/uL    Eosinophils Relative 3 %    Eosinophils Absolute 0.3 0.0 - 0.5 K/uL    Basophils Relative 1 %    Basophils Absolute 0.1 0.0 - 0.1 K/uL    Immature Granulocytes 2 %    Abs Immature Granulocytes 0.20 (H) 0.00 - 0.07 K/uL      Comment: Performed at [facility], [place] [address] [place], [place] [address]  Comprehensive metabolic panel     Status: Abnormal    Collection Time:  11/04/20  5:39 AM  Result Value Ref Range    Sodium 140 135 - 145 mmol/L    Potassium 3.9 3.5 - 5.1 mmol/L    Chloride 107 98 - 111 mmol/L    CO2 26 22 - 32 mmol/L    Glucose, Bld 119 (H) 70 - 99 mg/dL      Comment: Glucose reference range applies only to samples taken after fasting for at least 8 hours.    BUN 6 6 - 20 mg/dL    Creatinine, Ser 0.98 0.61 - 1.24 mg/dL    Calcium 9.2 8.9 - 10.3 mg/dL    Total Protein 6.5 6.5 - 8.1 g/dL    Albumin 2.8 (L) 3.5 - 5.0 g/dL    AST 19 15 -  41 U/L    ALT 46 (H) 0 - 44 U/L    Alkaline Phosphatase 140 (H) 38 - 126 U/L    Total Bilirubin 1.7 (H) 0.3 - 1.2 mg/dL    GFR, Estimated >60 >60 mL/min      Comment: (NOTE) Calculated using the CKD-EPI Creatinine Equation (2021)      Anion gap 7 5 - 15      Comment: Performed at [facility], [place] [address], [place], [place] [address]  Lipase, blood     Status: None    Collection Time: 11/04/20  5:39 AM  Result Value Ref Range    Lipase 33 11 - 51 U/L      Comment: Performed at [facility], [place] [address], [place], [place] [address]  Glucose, capillary     Status: Abnormal    Collection Time: 11/04/20  8:41 AM  Result Value Ref Range    Glucose-Capillary 109 (H) 70 - 99 mg/dL      Comment: Glucose reference range applies only to samples taken after fasting for at least 8 hours.       Imaging Results (Last 48 hours)  DG ERCP BILIARY & PANCREATIC DUCTS   Result Date: 11/04/2020 CLINICAL DATA:  Choledocholithiasis Sphincterotomy and balloon sweep EXAM: ERCP TECHNIQUE: Multiple spot images obtained with the fluoroscopic device and submitted for interpretation post-procedure. FLUOROSCOPY TIME:  Fluoroscopy Time:  45 seconds Radiation Exposure Index (if provided by the fluoroscopic device): Not provided Number of Acquired Spot Images: 9 COMPARISON:  MRI/MRCP 11/02/2020 FINDINGS: Submitted intraoperative fluoroscopic images demonstrate cannulation and opacification of  the intra and extrahepatic bile ducts. This is followed by a balloon sweep of the CBD. No filling defects identified within the common bile duct on the final provided image. Evaluation of the mid duct is limited due to overlying scope. IMPRESSION: Intraoperative fluoroscopic images of ERCP as above. These images were submitted for radiologic interpretation only. Please see the procedural report for the amount of contrast and the fluoroscopy time utilized. Electronically Signed   By: Miachel Roux M.D.   On: 11/04/2020 13:27           Assessment/Plan Gallstone pancreatitis  - will recheck labs in AM and if OK plan surgery tomorrow.  Risk and benefits discussed in detail with him and his wife.  They are in agreement with the plan.    FEN:  clears/IV fluids ID:  [email or web address] 8/16 >> day 3 DVT: SCD's   Hypertension  - on a OTC supplement at home   Earnstine Regal [facility] Surgery 11/04/2020, 2:00 PM Please see Amion for pager number during day hours 7:00am-4:30pm             Note Details  Author Earnstine Regal, PA-C File Time 11/04/2020  3:14 PM  Author Type Physician Assistant Status Cosign Needed  Last Editor Earnstine Regal, [facility] Service [place] # [phone] Admit Date 11/01/2020

## 2020-11-04 NOTE — Anesthesia Procedure Notes (Signed)
Procedure Name: Intubation Date/Time: 11/04/2020 12:21 PM Performed by: Cleda Daub, CRNA Pre-anesthesia Checklist: Patient identified, Emergency Drugs available, Suction available and Patient being monitored Patient Re-evaluated:Patient Re-evaluated prior to induction Oxygen Delivery Method: Circle system utilized Preoxygenation: Pre-oxygenation with 100% oxygen Induction Type: IV induction Ventilation: Mask ventilation without difficulty Laryngoscope Size: Mac and 4 Grade View: Grade II Tube type: Oral Tube size: 7.5 mm Number of attempts: 1 Airway Equipment and Method: Stylet and Oral airway Placement Confirmation: ETT inserted through vocal cords under direct vision, positive ETCO2 and breath sounds checked- equal and bilateral Secured at: 23 cm Tube secured with: Tape Dental Injury: Teeth and Oropharynx as per pre-operative assessment

## 2020-11-04 NOTE — Anesthesia Preprocedure Evaluation (Signed)
Anesthesia Evaluation  Patient identified by MRN, date of birth, ID band Patient awake    Reviewed: Allergy & Precautions, NPO status , Patient's Chart, lab work & pertinent test results  Airway Mallampati: II  TM Distance: >3 FB Neck ROM: Full    Dental no notable dental hx.    Pulmonary neg pulmonary ROS,    Pulmonary exam normal breath sounds clear to auscultation       Cardiovascular hypertension, Normal cardiovascular exam Rhythm:Regular Rate:Normal     Neuro/Psych negative neurological ROS  negative psych ROS   GI/Hepatic negative GI ROS, Neg liver ROS,   Endo/Other  negative endocrine ROS  Renal/GU negative Renal ROS  negative genitourinary   Musculoskeletal negative musculoskeletal ROS (+)   Abdominal   Peds negative pediatric ROS (+)  Hematology negative hematology ROS (+)   Anesthesia Other Findings   Reproductive/Obstetrics negative OB ROS                             Anesthesia Physical Anesthesia Plan  ASA: 2  Anesthesia Plan: General   Post-op Pain Management:    Induction: Intravenous  PONV Risk Score and Plan: 2 and Ondansetron, Dexamethasone and Treatment may vary due to age or medical condition  Airway Management Planned: Oral ETT  Additional Equipment:   Intra-op Plan:   Post-operative Plan: Extubation in OR  Informed Consent: I have reviewed the patients History and Physical, chart, labs and discussed the procedure including the risks, benefits and alternatives for the proposed anesthesia with the patient or authorized representative who has indicated his/her understanding and acceptance.     Dental advisory given  Plan Discussed with: CRNA and Surgeon  Anesthesia Plan Comments:         Anesthesia Quick Evaluation

## 2020-11-04 NOTE — Progress Notes (Signed)
PROGRESS NOTE   Sean Valencia  [id number] DOB: [birth year] DOA: 11/01/2020 PCP: Patient, No Pcp Per (Inactive)  Brief Narrative:  60 year old black male HTN not on meds, prior diverticulosis with small tubular adenomas Several months persistent abdominal pain right upper quadrant epigastric area Presented initially 8/14 lipase is 67 LFTs slightly elevated CT abdomen showed acute pancreatitis but patient had to go home-represented 8/16 Found to have acute pancreatitis GI consulted MRCP showed moderate pancreatitis borderline CBD dilatation  ERCP scheduled for 8/18 and then possible cholecystectomy as per general surgery  Hospital-Problem based course  Acute gallstone pancreatitis with likely past CBD stone No complicating features ERCP scheduled --postprocedure cholecystectomy possible LFTs are completely resolved HTN Poorly controlled Start amlodipine 10 mg   DVT prophylaxis: Lovenox Code Status: Full Family Communication: None Disposition:  Status is: Inpatient  Remains inpatient appropriate because:Hemodynamically unstable and Unsafe d/c plan  Dispo: The patient is from: Home              Anticipated d/c is to: Home              Patient currently is not medically stable to d/c.   Difficult to place patient No       Consultants:  General surgery GI  Procedures:   Antimicrobials:     Subjective: Hungry awaiting procedure  Objective: Vitals:   11/03/20 1725 11/03/20 2145 11/04/20 0507 11/04/20 0952  BP:  (!) 150/95 (!) 155/94 (!) 163/94  Pulse: 76 77 76 74  Resp: '16 20 16 16  '$ Temp:  99 F (37.2 C) 99 F (37.2 C) 98.3 F (36.8 C)  TempSrc:  Oral Oral Oral  SpO2: 100% 100% 100% 99%  Weight:      Height:        Intake/Output Summary (Last 24 hours) at 11/04/2020 1010 Last data filed at 11/04/2020 0700 Gross per 24 hour  Intake 437.11 ml  Output 1700 ml  Net -1262.89 ml    Filed Weights   11/02/20 1739 11/03/20 0500  Weight: 113.4 kg 116.3  kg    Examination:  Coherent pleasant no distress Abdomen soft no rebound epigastric area nontender Mild swelling and abdomen No lower extremity edema   Data Reviewed: personally reviewed   CBC    Component Value Date/Time   WBC 9.8 11/04/2020 0539   RBC 4.40 11/04/2020 0539   HGB 12.8 (L) 11/04/2020 0539   HCT 38.4 (L) 11/04/2020 0539   PLT 289 11/04/2020 0539   MCV 87.3 11/04/2020 0539   MCV 88.5 06/14/2013 1113   MCH 29.1 11/04/2020 0539   MCHC 33.3 11/04/2020 0539   RDW 13.9 11/04/2020 0539   LYMPHSABS 1.9 11/04/2020 0539   MONOABS 0.7 11/04/2020 0539   EOSABS 0.3 11/04/2020 0539   BASOSABS 0.1 11/04/2020 0539   CMP Latest Ref Rng & Units 11/04/2020 11/03/2020 11/02/2020  Glucose 70 - 99 mg/dL 119(H) 117(H) 111(H)  BUN 6 - 20 mg/dL '6 8 10  '$ Creatinine 0.61 - 1.24 mg/dL 0.98 0.87 0.97  Sodium 135 - 145 mmol/L 140 140 141  Potassium 3.5 - 5.1 mmol/L 3.9 3.8 3.7  Chloride 98 - 111 mmol/L 107 105 106  CO2 22 - 32 mmol/L '26 27 26  '$ Calcium 8.9 - 10.3 mg/dL 9.2 9.0 9.6  Total Protein 6.5 - 8.1 g/dL 6.5 6.6 7.1  Total Bilirubin 0.3 - 1.2 mg/dL 1.7(H) 1.9(H) 2.5(H)  Alkaline Phos 38 - 126 U/L 140(H) 151(H) 178(H)  AST 15 - 41 U/L 19 20 29  ALT 0 - 44 U/L 46(H) 60(H) 90(H)     Radiology Studies: No results found.   Scheduled Meds:  amLODipine  10 mg Oral Daily   Continuous Infusions:  sodium chloride 1,000 mL (11/04/20 0110)   piperacillin-tazobactam (ZOSYN)  IV 3.375 g (11/04/20 0116)     LOS: 2 days   Time spent: 64  Nita Sells, MD [facility] To contact the attending provider between 7A-7P or the covering provider during after hours 7P-7A, please log into the web site [email or web address] and access using universal [place] password for that web site. If you do not have the password, please call the hospital operator.  11/04/2020, 10:10 AM

## 2020-11-04 NOTE — Transfer of Care (Signed)
Immediate Anesthesia Transfer of Care Note  Patient: Sean Valencia  Procedure(s) Performed: ENDOSCOPIC RETROGRADE CHOLANGIOPANCREATOGRAPHY (ERCP) WITH PROPOFOL SPHINCTEROTOMY REMOVAL OF STONES  Patient Location: PACU  Anesthesia Type:General  Level of Consciousness: awake, alert , oriented and patient cooperative  Airway & Oxygen Therapy: Patient Spontanous Breathing and Patient connected to face mask oxygen  Post-op Assessment: Report given to RN and Post -op Vital signs reviewed and stable  Post vital signs: Reviewed and stable  Last Vitals:  Vitals Value Taken Time  BP 166/91 11/04/20 1325  Temp 37.2 C 11/04/20 1325  Pulse 91 11/04/20 1325  Resp 15 11/04/20 1325  SpO2 100 % 11/04/20 1325  Vitals shown include unvalidated device data.  Last Pain:  Vitals:   11/04/20 1325  TempSrc: Oral  PainSc: 0-No pain      Patients Stated Pain Goal: 0 ([id number])  Complications: No notable events documented.

## 2020-11-04 NOTE — Progress Notes (Signed)
POST ERCP NOTE:  Pt doing well. No abdominal pain or nausea or melena. Vitals stable. Abdominal Exam: soft, nontender. Discussed with pt  Lap chole planned for tomorrow  Sean Valencia

## 2020-11-04 NOTE — Interval H&P Note (Signed)
History and Physical Interval Note:  11/04/2020 12:08 PM  Sean Valencia  has presented today for surgery, with the diagnosis of CBD stones.  The various methods of treatment have been discussed with the patient and family. After consideration of risks, benefits and other options for treatment, the patient has consented to  Procedure(s): ENDOSCOPIC RETROGRADE CHOLANGIOPANCREATOGRAPHY (ERCP) WITH PROPOFOL (N/A) as a surgical intervention.  The patient's history has been reviewed, patient examined, no change in status, stable for surgery.  I have reviewed the patient's chart and labs.  Questions were answered to the patient's satisfaction.     Jackquline Denmark

## 2020-11-04 NOTE — Anesthesia Postprocedure Evaluation (Signed)
Anesthesia Post Note  Patient: Sean Valencia  Procedure(s) Performed: ENDOSCOPIC RETROGRADE CHOLANGIOPANCREATOGRAPHY (ERCP) WITH PROPOFOL SPHINCTEROTOMY REMOVAL OF STONES     Patient location during evaluation: PACU Anesthesia Type: General Level of consciousness: awake and alert Pain management: pain level controlled Vital Signs Assessment: post-procedure vital signs reviewed and stable Respiratory status: spontaneous breathing, nonlabored ventilation, respiratory function stable and patient connected to nasal cannula oxygen Cardiovascular status: blood pressure returned to baseline and stable Postop Assessment: no apparent nausea or vomiting Anesthetic complications: no   No notable events documented.  Last Vitals:  Vitals:   11/04/20 1135 11/04/20 1325  BP: (!) 193/84 (!) 166/91  Pulse:    Resp: 19 12  Temp: 37.3 C 37.2 C  SpO2: 99% 100%    Last Pain:  Vitals:   11/04/20 1325  TempSrc: Oral  PainSc: 0-No pain                 Bretta Fees S

## 2020-11-05 ENCOUNTER — Inpatient Hospital Stay (HOSPITAL_COMMUNITY): Payer: BC Managed Care – PPO | Admitting: Anesthesiology

## 2020-11-05 ENCOUNTER — Encounter (HOSPITAL_COMMUNITY)
Admission: EM | Disposition: A | Discharge status: Home / Self Care | Payer: Self-pay | Source: Home / Self Care | Attending: Family Medicine

## 2020-11-05 ENCOUNTER — Encounter (HOSPITAL_COMMUNITY): Payer: Self-pay | Admitting: Gastroenterology

## 2020-11-05 HISTORY — PX: CHOLECYSTECTOMY: SHX55

## 2020-11-05 LAB — COMPREHENSIVE METABOLIC PANEL
ALT: 46 U/L — ABNORMAL HIGH (ref 0–44)
AST: 24 U/L (ref 15–41)
Albumin: 3.1 g/dL — ABNORMAL LOW (ref 3.5–5.0)
Alkaline Phosphatase: 145 U/L — ABNORMAL HIGH (ref 38–126)
Anion gap: 8 (ref 5–15)
BUN: 10 mg/dL (ref 6–20)
CO2: 25 mmol/L (ref 22–32)
Calcium: 9.7 mg/dL (ref 8.9–10.3)
Chloride: 107 mmol/L (ref 98–111)
Creatinine, Ser: 0.96 mg/dL (ref 0.61–1.24)
GFR, Estimated: >60 mL/min (ref >60)
Glucose, Bld: 108 mg/dL — ABNORMAL HIGH (ref 70–99)
Potassium: 4.8 mmol/L (ref 3.5–5.1)
Sodium: 140 mmol/L (ref 135–145)
Total Bilirubin: 1.5 mg/dL — ABNORMAL HIGH (ref 0.3–1.2)
Total Protein: 7.3 g/dL (ref 6.5–8.1)

## 2020-11-05 LAB — CBC WITH DIFFERENTIAL/PLATELET
Abs Immature Granulocytes: 0.17 10*3/uL — ABNORMAL HIGH (ref 0.00–0.07)
Basophils Absolute: 0 10*3/uL (ref 0.0–0.1)
Basophils Relative: 0 %
Eosinophils Absolute: 0 10*3/uL (ref 0.0–0.5)
Eosinophils Relative: 0 %
HCT: 41.6 % (ref 39.0–52.0)
Hemoglobin: 13.7 g/dL (ref 13.0–17.0)
Immature Granulocytes: 1 %
Lymphocytes Relative: 13 %
Lymphs Abs: 1.6 10*3/uL (ref 0.7–4.0)
MCH: 29 pg (ref 26.0–34.0)
MCHC: 32.9 g/dL (ref 30.0–36.0)
MCV: 88.1 fL (ref 80.0–100.0)
Monocytes Absolute: 0.5 10*3/uL (ref 0.1–1.0)
Monocytes Relative: 4 %
Neutro Abs: 10.1 10*3/uL — ABNORMAL HIGH (ref 1.7–7.7)
Neutrophils Relative %: 82 %
Platelets: 331 10*3/uL (ref 150–400)
RBC: 4.72 MIL/uL (ref 4.22–5.81)
RDW: 13.6 % (ref 11.5–15.5)
WBC: 12.5 10*3/uL — ABNORMAL HIGH (ref 4.0–10.5)
nRBC: 0 % (ref 0.0–0.2)

## 2020-11-05 LAB — GLUCOSE, CAPILLARY
Glucose-Capillary: 111 mg/dL — ABNORMAL HIGH (ref 70–99)
Glucose-Capillary: 132 mg/dL — ABNORMAL HIGH (ref 70–99)
Glucose-Capillary: 95 mg/dL (ref 70–99)

## 2020-11-05 LAB — LIPASE, BLOOD: Lipase: 25 U/L (ref 11–51)

## 2020-11-05 SURGERY — LAPAROSCOPIC CHOLECYSTECTOMY
Anesthesia: General | Site: Abdomen

## 2020-11-05 SURGERY — LAPAROSCOPIC CHOLECYSTECTOMY
Anesthesia: General

## 2020-11-05 MED ORDER — ROCURONIUM BROMIDE 10 MG/ML (PF) SYRINGE
PREFILLED_SYRINGE | INTRAVENOUS | Status: DC | PRN
Start: 2020-11-05 — End: 2020-11-05
  Administered 2020-11-05: 70 mg via INTRAVENOUS
  Administered 2020-11-05 (×2): 10 mg via INTRAVENOUS

## 2020-11-05 MED ORDER — PROPOFOL 10 MG/ML IV BOLUS
INTRAVENOUS | Status: DC | PRN
Start: 2020-11-05 — End: 2020-11-05
  Administered 2020-11-05: 200 mg via INTRAVENOUS

## 2020-11-05 MED ORDER — MIDAZOLAM HCL 2 MG/2ML IJ SOLN
INTRAMUSCULAR | Status: AC
  Filled 2020-11-05: qty 2

## 2020-11-05 MED ORDER — SODIUM CHLORIDE 0.9% FLUSH: 3.0000 mL | INTRAVENOUS | Status: DC | PRN

## 2020-11-05 MED ORDER — FENTANYL CITRATE (PF) 100 MCG/2ML IJ SOLN
INTRAMUSCULAR | Status: DC | PRN
  Administered 2020-11-05: 100 ug via INTRAVENOUS
  Administered 2020-11-05 (×3): 50 ug via INTRAVENOUS

## 2020-11-05 MED ORDER — PHENYLEPHRINE HCL (PRESSORS) 10 MG/ML IV SOLN
INTRAVENOUS | Status: AC
  Filled 2020-11-05: qty 2

## 2020-11-05 MED ORDER — PROCHLORPERAZINE EDISYLATE 10 MG/2ML IJ SOLN: 5.0000 mg | INTRAMUSCULAR | Status: DC | PRN

## 2020-11-05 MED ORDER — MAGIC MOUTHWASH
15.0000 mL | Freq: Four times a day (QID) | ORAL | Status: DC | PRN
  Filled 2020-11-05: qty 15

## 2020-11-05 MED ORDER — BUPIVACAINE-EPINEPHRINE 0.25% -1:200000 IJ SOLN
INTRAMUSCULAR | Status: AC
  Filled 2020-11-05: qty 1

## 2020-11-05 MED ORDER — FENTANYL CITRATE (PF) 100 MCG/2ML IJ SOLN
INTRAMUSCULAR | Status: AC
  Filled 2020-11-05: qty 2

## 2020-11-05 MED ORDER — LACTATED RINGERS IV BOLUS: 1000.0000 mL | Freq: Three times a day (TID) | INTRAVENOUS | Status: DC | PRN

## 2020-11-05 MED ORDER — BUPIVACAINE LIPOSOME 1.3 % IJ SUSP
20.0000 mL | Freq: Once | INTRAMUSCULAR | Status: AC
  Filled 2020-11-05: qty 20

## 2020-11-05 MED ORDER — ALUM & MAG HYDROXIDE-SIMETH 200-200-20 MG/5ML PO SUSP: 30.0000 mL | Freq: Four times a day (QID) | ORAL | Status: DC | PRN

## 2020-11-05 MED ORDER — ACETAMINOPHEN 500 MG PO TABS
1000.0000 mg | ORAL_TABLET | Freq: Four times a day (QID) | ORAL | Status: DC
  Administered 2020-11-06 – 2020-11-08 (×9): 1000 mg via ORAL
  Filled 2020-11-05 (×12): qty 2

## 2020-11-05 MED ORDER — METHOCARBAMOL 500 MG PO TABS
1000.0000 mg | ORAL_TABLET | Freq: Four times a day (QID) | ORAL | Status: DC | PRN
  Administered 2020-11-05 – 2020-11-06 (×2): 1000 mg via ORAL
  Filled 2020-11-05 (×2): qty 2

## 2020-11-05 MED ORDER — DEXTROSE 5 % IV SOLN
1000.0000 mg | Freq: Four times a day (QID) | INTRAVENOUS | Status: DC | PRN
  Filled 2020-11-05: qty 10

## 2020-11-05 MED ORDER — BUPIVACAINE LIPOSOME 1.3 % IJ SUSP
INTRAMUSCULAR | Status: DC | PRN
  Administered 2020-11-05: 20 mL

## 2020-11-05 MED ORDER — PROPOFOL 10 MG/ML IV BOLUS
INTRAVENOUS | Status: AC
  Filled 2020-11-05: qty 20

## 2020-11-05 MED ORDER — DEXAMETHASONE SODIUM PHOSPHATE 10 MG/ML IJ SOLN
INTRAMUSCULAR | Status: DC | PRN
Start: 2020-11-05 — End: 2020-11-05
  Administered 2020-11-05: 5 mg via INTRAVENOUS

## 2020-11-05 MED ORDER — SODIUM CHLORIDE 0.9 % IV SOLN: 250.0000 mL | INTRAVENOUS | Status: DC | PRN

## 2020-11-05 MED ORDER — ONDANSETRON HCL 4 MG/2ML IJ SOLN
INTRAMUSCULAR | Status: DC | PRN
  Administered 2020-11-05: 4 mg via INTRAVENOUS

## 2020-11-05 MED ORDER — PROMETHAZINE HCL 25 MG/ML IJ SOLN: 6.2500 mg | INTRAMUSCULAR | Status: DC | PRN

## 2020-11-05 MED ORDER — MENTHOL 3 MG MT LOZG: 1.0000 | LOZENGE | OROMUCOSAL | Status: DC | PRN

## 2020-11-05 MED ORDER — ACETAMINOPHEN 10 MG/ML IV SOLN
1000.0000 mg | Freq: Once | INTRAVENOUS | Status: DC | PRN
  Administered 2020-11-05: 1000 mg via INTRAVENOUS

## 2020-11-05 MED ORDER — LACTATED RINGERS IV SOLN: INTRAVENOUS | Status: DC

## 2020-11-05 MED ORDER — FENTANYL CITRATE (PF) 100 MCG/2ML IJ SOLN
25.0000 ug | INTRAMUSCULAR | Status: DC | PRN
  Administered 2020-11-05 (×3): 50 ug via INTRAVENOUS

## 2020-11-05 MED ORDER — ACETAMINOPHEN 10 MG/ML IV SOLN
INTRAVENOUS | Status: AC
  Filled 2020-11-05: qty 100

## 2020-11-05 MED ORDER — FENTANYL CITRATE (PF) 250 MCG/5ML IJ SOLN
INTRAMUSCULAR | Status: AC
  Filled 2020-11-05: qty 5

## 2020-11-05 MED ORDER — BUPIVACAINE-EPINEPHRINE 0.25% -1:200000 IJ SOLN
INTRAMUSCULAR | Status: DC | PRN
  Administered 2020-11-05: 30 mL

## 2020-11-05 MED ORDER — MIDAZOLAM HCL 5 MG/5ML IJ SOLN
INTRAMUSCULAR | Status: DC | PRN
  Administered 2020-11-05: 2 mg via INTRAVENOUS

## 2020-11-05 MED ORDER — ONDANSETRON HCL 4 MG/2ML IJ SOLN
INTRAMUSCULAR | Status: AC
  Filled 2020-11-05: qty 2

## 2020-11-05 MED ORDER — LACTATED RINGERS IR SOLN
Status: DC | PRN
  Administered 2020-11-05 (×2): 1000 mL

## 2020-11-05 MED ORDER — PHENOL 1.4 % MT LIQD: 2.0000 | OROMUCOSAL | Status: DC | PRN

## 2020-11-05 MED ORDER — PHENYLEPHRINE 40 MCG/ML (10ML) SYRINGE FOR IV PUSH (FOR BLOOD PRESSURE SUPPORT)
PREFILLED_SYRINGE | INTRAVENOUS | Status: AC
  Filled 2020-11-05: qty 20

## 2020-11-05 MED ORDER — SODIUM CHLORIDE 0.9% FLUSH
3.0000 mL | Freq: Two times a day (BID) | INTRAVENOUS | Status: DC
  Administered 2020-11-07 – 2020-11-08 (×2): 3 mL via INTRAVENOUS

## 2020-11-05 MED ORDER — SIMETHICONE 40 MG/0.6ML PO SUSP
80.0000 mg | Freq: Four times a day (QID) | ORAL | Status: DC | PRN
  Filled 2020-11-05: qty 1.2

## 2020-11-05 MED ORDER — LIDOCAINE 2% (20 MG/ML) 5 ML SYRINGE
INTRAMUSCULAR | Status: DC | PRN
  Administered 2020-11-05: 100 mg via INTRAVENOUS

## 2020-11-05 MED ORDER — LIP MEDEX EX OINT
1.0000 "application " | TOPICAL_OINTMENT | Freq: Two times a day (BID) | CUTANEOUS | Status: DC
  Administered 2020-11-05 – 2020-11-08 (×6): 1 via TOPICAL
  Filled 2020-11-05 (×2): qty 7

## 2020-11-05 MED ORDER — SUGAMMADEX SODIUM 200 MG/2ML IV SOLN
INTRAVENOUS | Status: DC | PRN
Start: 2020-11-05 — End: 2020-11-05
  Administered 2020-11-05: 250 mg via INTRAVENOUS

## 2020-11-05 MED ORDER — SODIUM CHLORIDE 0.9 % IR SOLN
Status: DC | PRN
  Administered 2020-11-05: 1000 mL

## 2020-11-05 MED ORDER — CALCIUM POLYCARBOPHIL 625 MG PO TABS
625.0000 mg | ORAL_TABLET | Freq: Two times a day (BID) | ORAL | Status: DC
  Administered 2020-11-05 – 2020-11-08 (×6): 625 mg via ORAL
  Filled 2020-11-05 (×6): qty 1

## 2020-11-05 MED ORDER — BISACODYL 10 MG RE SUPP: 10.0000 mg | Freq: Two times a day (BID) | RECTAL | Status: DC | PRN

## 2020-11-05 MED ORDER — DIPHENHYDRAMINE HCL 50 MG/ML IJ SOLN
12.5000 mg | Freq: Four times a day (QID) | INTRAMUSCULAR | Status: DC | PRN
  Administered 2020-11-07: 12.5 mg via INTRAVENOUS
  Filled 2020-11-05: qty 1

## 2020-11-05 SURGICAL SUPPLY — 45 items
APPLIER CLIP 5 13 M/L LIGAMAX5 (MISCELLANEOUS)
APPLIER CLIP ROT 10 11.4 M/L (STAPLE)
BAG COUNTER SPONGE SURGICOUNT (BAG) ×2 IMPLANT
CABLE HIGH FREQUENCY MONO STRZ (ELECTRODE) IMPLANT
CLIP APPLIE 5 13 M/L LIGAMAX5 (MISCELLANEOUS) IMPLANT
CLIP APPLIE ROT 10 11.4 M/L (STAPLE) IMPLANT
COVER SURGICAL LIGHT HANDLE (MISCELLANEOUS) ×2 IMPLANT
DECANTER SPIKE VIAL GLASS SM (MISCELLANEOUS) ×2 IMPLANT
DRAIN CHANNEL 19F RND (DRAIN) ×1 IMPLANT
DRAPE 3/4 80X56 (DRAPES) ×2 IMPLANT
DRAPE C-ARM 42X120 X-RAY (DRAPES) ×2 IMPLANT
DRAPE UTILITY XL STRL (DRAPES) ×2 IMPLANT
DRAPE WARM FLUID 44X44 (DRAPES) ×2 IMPLANT
DRSG TEGADERM 2-3/8X2-3/4 SM (GAUZE/BANDAGES/DRESSINGS) ×3 IMPLANT
DRSG TEGADERM 4X4.75 (GAUZE/BANDAGES/DRESSINGS) ×2 IMPLANT
DRSG TEGADERM 6X8 (GAUZE/BANDAGES/DRESSINGS) ×2 IMPLANT
ELECT REM PT RETURN 15FT ADLT (MISCELLANEOUS) ×2 IMPLANT
ENDOLOOP SUT PDS II  0 18 (SUTURE) ×4
ENDOLOOP SUT PDS II 0 18 (SUTURE) IMPLANT
EVACUATOR SILICONE 100CC (DRAIN) ×1 IMPLANT
GAUZE SPONGE 2X2 8PLY STRL LF (GAUZE/BANDAGES/DRESSINGS) IMPLANT
GLOVE SURG NEOPR MICRO LF SZ8 (GLOVE) ×2 IMPLANT
GLOVE SURG UNDER LTX SZ8 (GLOVE) ×2 IMPLANT
GOWN STRL REUS W/TWL XL LVL3 (GOWN DISPOSABLE) ×4 IMPLANT
IRRIG SUCT STRYKERFLOW 2 WTIP (MISCELLANEOUS) ×2
IRRIGATION SUCT STRKRFLW 2 WTP (MISCELLANEOUS) ×1 IMPLANT
KIT BASIN OR (CUSTOM PROCEDURE TRAY) ×2 IMPLANT
KIT TURNOVER KIT A (KITS) ×2 IMPLANT
PENCIL SMOKE EVACUATOR (MISCELLANEOUS) IMPLANT
POUCH RETRIEVAL ECOSAC 10 (ENDOMECHANICALS) ×1 IMPLANT
POUCH RETRIEVAL ECOSAC 10MM (ENDOMECHANICALS) ×2
SCISSORS LAP 5X35 DISP (ENDOMECHANICALS) ×2 IMPLANT
SET CHOLANGIOGRAPH MIX (MISCELLANEOUS) ×2 IMPLANT
SET TUBE SMOKE EVAC HIGH FLOW (TUBING) ×2 IMPLANT
SLEEVE XCEL OPT CAN 5 100 (ENDOMECHANICALS) IMPLANT
SPONGE GAUZE 2X2 STER 10/PKG (GAUZE/BANDAGES/DRESSINGS) ×1
SUT MNCRL AB 4-0 PS2 18 (SUTURE) ×2 IMPLANT
SUT PDS AB 1 CT  36 (SUTURE)
SUT PDS AB 1 CT 36 (SUTURE) IMPLANT
SYR 20ML LL LF (SYRINGE) ×2 IMPLANT
TOWEL OR 17X26 10 PK STRL BLUE (TOWEL DISPOSABLE) ×2 IMPLANT
TOWEL OR NON WOVEN STRL DISP B (DISPOSABLE) ×2 IMPLANT
TRAY LAPAROSCOPIC (CUSTOM PROCEDURE TRAY) ×2 IMPLANT
TROCAR BLADELESS OPT 5 100 (ENDOMECHANICALS) ×2 IMPLANT
TROCAR XCEL NON-BLD 11X100MML (ENDOMECHANICALS) ×2 IMPLANT

## 2020-11-05 NOTE — Interval H&P Note (Signed)
History and Physical Interval Note:  11/05/2020 2:50 PM  Sean Valencia  has presented today for surgery, with the diagnosis of GALLSTONES,PANCREATITIS.  The various methods of treatment have been discussed with the patient and family. After consideration of risks, benefits and other options for treatment, the patient has consented to  Procedure(s): LAPAROSCOPIC CHOLECYSTECTOMY (N/A) as a surgical intervention.  The patient's history has been reviewed, patient examined, no change in status, stable for surgery.  I have reviewed the patient's chart and labs.  Questions were answered to the patient's satisfaction.    I have re-reviewed the the patient's records, history, medications, and allergies.  I have re-examined the patient.  I again discussed intraoperative plans and goals of post-operative recovery.  The patient agrees to proceed.  Sean Valencia  March 15, 1961 [id number]  Patient Care Team: Patient, No Pcp Per (Inactive) as PCP - General (General Practice)  Patient Active Problem List   Diagnosis Date Noted   Acute pancreatitis 11/02/2020   Elevated blood pressure reading 11/02/2020   Gallstones 11/02/2020   Gallstone pancreatitis 11/02/2020    Past Medical History:  Diagnosis Date   Allergy    Anal fistula    Arthritis    History of kidney stones    HTN (hypertension)     Past Surgical History:  Procedure Laterality Date   ENDOSCOPIC RETROGRADE CHOLANGIOPANCREATOGRAPHY (ERCP) WITH PROPOFOL N/A 11/04/2020   Procedure: ENDOSCOPIC RETROGRADE CHOLANGIOPANCREATOGRAPHY (ERCP) WITH PROPOFOL;  Surgeon: Jackquline Denmark, MD;  Location: [facility];  Service: Endoscopy;  Laterality: N/A;   INGUINAL HERNIA REPAIR Bilateral 1992   REMOVAL OF STONES  11/04/2020   Procedure: REMOVAL OF STONES;  Surgeon: Jackquline Denmark, MD;  Location: [facility];  Service: Endoscopy;;   SPHINCTEROTOMY  11/04/2020   Procedure: Joan Mayans;  Surgeon: Jackquline Denmark, MD;  Location: [facility];  Service:  Endoscopy;;    Social History   Socioeconomic History   Marital status: Married    Spouse name: Not on file   Number of children: 5   Years of education: Not on file   Highest education level: Not on file  Occupational History   Occupation: Teacher  Tobacco Use   Smoking status: Never   Smokeless tobacco: Never  Vaping Use   Vaping Use: Never used  Substance and Sexual Activity   Alcohol use: Yes    Comment: 4 per year   Drug use: No   Sexual activity: Yes    Birth control/protection: None  Other Topics Concern   Not on file  Social History Narrative   Not on file   Social Determinants of Health   Financial Resource Strain: Not on file  Food Insecurity: Not on file  Transportation Needs: Not on file  Physical Activity: Not on file  Stress: Not on file  Social Connections: Not on file  Intimate Partner Violence: Not on file    Family History  Problem Relation Age of Onset   Diabetes Father    Hypertension Father    Bone cancer Maternal Grandfather    Diabetes Paternal Grandmother    Diabetes Paternal Grandfather    Leukemia Paternal Grandfather    Hypercalcemia Brother    Colon cancer Neg Hx    Esophageal cancer Neg Hx    Stomach cancer Neg Hx    Rectal cancer Neg Hx     Facility-Administered Medications Prior to Admission  Medication Dose Route Frequency Provider Last Rate Last Admin   0.9 %  sodium chloride infusion  500 mL Intravenous  Once Mauri Pole, MD       No medications prior to admission.    Current Facility-Administered Medications  Medication Dose Route Frequency Provider Last Rate Last Admin   [MAR Hold] 0.9 %  sodium chloride infusion   Intravenous PRN Eulogio Bear U, DO   Stopped at 11/04/20 1112   [MAR Hold] acetaminophen (TYLENOL) tablet 650 mg  650 mg Oral Q6H PRN Eulogio Bear U, DO   650 mg at 11/02/20 1615   [MAR Hold] amLODipine (NORVASC) tablet 10 mg  10 mg Oral Daily Nita Sells, MD   10 mg at 11/05/20 0918    fentaNYL (SUBLIMAZE) injection 25-50 mcg  25-50 mcg Intravenous Q5 min PRN Myrtie Soman, MD       [facility] Hold] HYDROmorphone (DILAUDID) injection 0.5-1 mg  0.5-1 mg Intravenous Q2H PRN Rise Patience, MD   1 mg at 11/02/20 0603   [MAR Hold] labetalol (NORMODYNE) injection 5 mg  5 mg Intravenous Q2H PRN Rise Patience, MD   5 mg at 11/03/20 0630   lactated ringers infusion   Intravenous Continuous Michael Boston, MD 50 mL/hr at 11/05/20 1321 New Bag at 11/05/20 1321   [MAR Hold] ondansetron (ZOFRAN) injection 4 mg  4 mg Intravenous Q6H PRN Eulogio Bear U, DO       oxyCODONE (Oxy IR/ROXICODONE) immediate release tablet 5 mg  5 mg Oral Once PRN Myrtie Soman, MD       Or   oxyCODONE (ROXICODONE) 5 MG/5ML solution 5 mg  5 mg Oral Once PRN Myrtie Soman, MD       [facility] Hold] piperacillin-tazobactam (ZOSYN) IVPB 3.375 g  3.375 g Intravenous Q8H Poindexter, Leann T, RPH 12.5 mL/hr at 11/05/20 0923 3.375 g at 11/05/20 [id number]   promethazine (PHENERGAN) injection 6.25-12.5 mg  6.25-12.5 mg Intravenous Q15 min PRN Myrtie Soman, MD         No Known Allergies  BP (!) 152/74 (BP Location: Right Arm)   Pulse 72   Temp 99.1 F (37.3 C) (Oral)   Resp 18   Ht '5\' 11"'$  (1.803 m)   Wt 113.9 kg   SpO2 100%   BMI 35.02 kg/m   Labs: Results for orders placed or performed during the hospital encounter of 11/01/20 (from the past 48 hour(s))  Glucose, capillary     Status: Abnormal   Collection Time: 11/03/20  3:47 PM  Result Value Ref Range   Glucose-Capillary 151 (H) 70 - 99 mg/dL    Comment: Glucose reference range applies only to samples taken after fasting for at least 8 hours.  Glucose, capillary     Status: Abnormal   Collection Time: 11/03/20  9:48 PM  Result Value Ref Range   Glucose-Capillary 154 (H) 70 - 99 mg/dL    Comment: Glucose reference range applies only to samples taken after fasting for at least 8 hours.   Comment 1 Notify RN    Comment 2 Document in Chart   CBC with  Differential/Platelet     Status: Abnormal   Collection Time: 11/04/20  5:39 AM  Result Value Ref Range   WBC 9.8 4.0 - 10.5 K/uL   RBC 4.40 4.22 - 5.81 MIL/uL   Hemoglobin 12.8 (L) 13.0 - 17.0 g/dL   HCT 38.4 (L) 39.0 - 52.0 %   MCV 87.3 80.0 - 100.0 fL   MCH 29.1 26.0 - 34.0 pg   MCHC 33.3 30.0 - 36.0 g/dL   RDW 13.9 11.5 - 15.5 %  Platelets 289 150 - 400 K/uL   nRBC 0.0 0.0 - 0.2 %   Neutrophils Relative % 68 %   Neutro Abs 6.7 1.7 - 7.7 K/uL   Lymphocytes Relative 19 %   Lymphs Abs 1.9 0.7 - 4.0 K/uL   Monocytes Relative 7 %   Monocytes Absolute 0.7 0.1 - 1.0 K/uL   Eosinophils Relative 3 %   Eosinophils Absolute 0.3 0.0 - 0.5 K/uL   Basophils Relative 1 %   Basophils Absolute 0.1 0.0 - 0.1 K/uL   Immature Granulocytes 2 %   Abs Immature Granulocytes 0.20 (H) 0.00 - 0.07 K/uL    Comment: Performed at [facility], [place] [address] [place], [place] [address]  Comprehensive metabolic panel     Status: Abnormal   Collection Time: 11/04/20  5:39 AM  Result Value Ref Range   Sodium 140 135 - 145 mmol/L   Potassium 3.9 3.5 - 5.1 mmol/L   Chloride 107 98 - 111 mmol/L   CO2 26 22 - 32 mmol/L   Glucose, Bld 119 (H) 70 - 99 mg/dL    Comment: Glucose reference range applies only to samples taken after fasting for at least 8 hours.   BUN 6 6 - 20 mg/dL   Creatinine, Ser 0.98 0.61 - 1.24 mg/dL   Calcium 9.2 8.9 - 10.3 mg/dL   Total Protein 6.5 6.5 - 8.1 g/dL   Albumin 2.8 (L) 3.5 - 5.0 g/dL   AST 19 15 - 41 U/L   ALT 46 (H) 0 - 44 U/L   Alkaline Phosphatase 140 (H) 38 - 126 U/L   Total Bilirubin 1.7 (H) 0.3 - 1.2 mg/dL   GFR, Estimated >60 >60 mL/min    Comment: (NOTE) Calculated using the CKD-EPI Creatinine Equation (2021)    Anion gap 7 5 - 15    Comment: Performed at [facility], [place] [address] [place], [place] [address]  Lipase, blood     Status: None   Collection Time: 11/04/20  5:39 AM  Result Value Ref Range   Lipase 33 11 -  51 U/L    Comment: Performed at [facility], [place] [address] [place], [place] [address]  Glucose, capillary     Status: Abnormal   Collection Time: 11/04/20  8:41 AM  Result Value Ref Range   Glucose-Capillary 109 (H) 70 - 99 mg/dL    Comment: Glucose reference range applies only to samples taken after fasting for at least 8 hours.  Glucose, capillary     Status: Abnormal   Collection Time: 11/04/20  3:45 PM  Result Value Ref Range   Glucose-Capillary 140 (H) 70 - 99 mg/dL    Comment: Glucose reference range applies only to samples taken after fasting for at least 8 hours.  Glucose, capillary     Status: Abnormal   Collection Time: 11/05/20 12:03 AM  Result Value Ref Range   Glucose-Capillary 111 (H) 70 - 99 mg/dL    Comment: Glucose reference range applies only to samples taken after fasting for at least 8 hours.  CBC with Differential/Platelet     Status: Abnormal   Collection Time: 11/05/20  4:28 AM  Result Value Ref Range   WBC 12.5 (H) 4.0 - 10.5 K/uL   RBC 4.72 4.22 - 5.81 MIL/uL   Hemoglobin 13.7 13.0 - 17.0 g/dL   HCT 41.6 39.0 - 52.0 %   MCV 88.1 80.0 - 100.0 fL   MCH 29.0 26.0 -  34.0 pg   MCHC 32.9 30.0 - 36.0 g/dL   RDW 13.6 11.5 - 15.5 %   Platelets 331 150 - 400 K/uL   nRBC 0.0 0.0 - 0.2 %   Neutrophils Relative % 82 %   Neutro Abs 10.1 (H) 1.7 - 7.7 K/uL   Lymphocytes Relative 13 %   Lymphs Abs 1.6 0.7 - 4.0 K/uL   Monocytes Relative 4 %   Monocytes Absolute 0.5 0.1 - 1.0 K/uL   Eosinophils Relative 0 %   Eosinophils Absolute 0.0 0.0 - 0.5 K/uL   Basophils Relative 0 %   Basophils Absolute 0.0 0.0 - 0.1 K/uL   Immature Granulocytes 1 %   Abs Immature Granulocytes 0.17 (H) 0.00 - 0.07 K/uL    Comment: Performed at [facility], [place] [address] [place], [place] [address]  Comprehensive metabolic panel     Status: Abnormal   Collection Time: 11/05/20  4:28 AM  Result Value Ref Range   Sodium 140 135 - 145 mmol/L    Potassium 4.8 3.5 - 5.1 mmol/L    Comment: DELTA CHECK NOTED   Chloride 107 98 - 111 mmol/L   CO2 25 22 - 32 mmol/L   Glucose, Bld 108 (H) 70 - 99 mg/dL    Comment: Glucose reference range applies only to samples taken after fasting for at least 8 hours.   BUN 10 6 - 20 mg/dL   Creatinine, Ser 0.96 0.61 - 1.24 mg/dL   Calcium 9.7 8.9 - 10.3 mg/dL   Total Protein 7.3 6.5 - 8.1 g/dL   Albumin 3.1 (L) 3.5 - 5.0 g/dL   AST 24 15 - 41 U/L   ALT 46 (H) 0 - 44 U/L   Alkaline Phosphatase 145 (H) 38 - 126 U/L   Total Bilirubin 1.5 (H) 0.3 - 1.2 mg/dL   GFR, Estimated >60 >60 mL/min    Comment: (NOTE) Calculated using the CKD-EPI Creatinine Equation (2021)    Anion gap 8 5 - 15    Comment: Performed at [facility], [place] [address], [place], [place] [address]  Lipase, blood     Status: None   Collection Time: 11/05/20  4:28 AM  Result Value Ref Range   Lipase 25 11 - 51 U/L    Comment: Performed at [facility], [place] [address] [place], [place] [address]  Glucose, capillary     Status: None   Collection Time: 11/05/20  7:55 AM  Result Value Ref Range   Glucose-Capillary 95 70 - 99 mg/dL    Comment: Glucose reference range applies only to samples taken after fasting for at least 8 hours.    Imaging / Studies: CT Abdomen Pelvis W Contrast  Result Date: 10/31/2020 CLINICAL DATA:  Abdominal distension, abdominal pain. EXAM: CT ABDOMEN AND PELVIS WITH CONTRAST TECHNIQUE: Multidetector CT imaging of the abdomen and pelvis was performed using the standard protocol following bolus administration of intravenous contrast. CONTRAST:  47m OMNIPAQUE IOHEXOL 350 MG/ML SOLN COMPARISON:  None. FINDINGS: Lower chest: Mild bibasilar scarring/atelectasis. Hepatobiliary: No focal liver abnormality is seen. Gallbladder walls appear thickened but there is no pericholecystic fluid seen. Pancreas: Ill-defined fluid/edema about the pancreatic head/proximal body. Pancreatic tail  appears normal. Spleen: Normal in size without focal abnormality. Adrenals/Urinary Tract: Adrenal glands appear normal. Kidneys are unremarkable without mass, stone or hydronephrosis. No ureteral or bladder calculi are identified. Bladder is unremarkable, decompressed. Stomach/Bowel: No dilated large or small bowel loops. Appendix is normal. Diverticulosis  within the upper sigmoid colon but no focal inflammatory change to suggest acute diverticulitis. Probable reactive thickening of the walls of the duodenum, given the proximity to the peripancreatic inflammation. Stomach is unremarkable, partially decompressed. Vascular/Lymphatic: Mild aortic atherosclerosis. No acute-appearing vascular abnormality. No enlarged lymph nodes seen in the abdomen or pelvis. Reproductive: Prostate is unremarkable. Other: No active hemorrhage or abscess collection. No free intraperitoneal air. Musculoskeletal: No acute or suspicious osseous abnormality. Small RIGHT inguinal hernia which contains fat only. IMPRESSION: 1. Findings are consistent with acute pancreatitis. No evidence of pancreatic necrosis. 2. Gallbladder walls appear thickened but there is no pericholecystic fluid seen, likely reactive to the adjacent pancreatitis. 3. Similarly, suspect reactive thickening of the walls of the duodenum given the adjacent peripancreatic inflammation. 4. Colonic diverticulosis without evidence of acute diverticulitis. 5. Small RIGHT inguinal hernia which contains fat only. Aortic Atherosclerosis (ICD10-I70.0). Electronically Signed   By: Franki Cabot M.D.   On: 10/31/2020 20:11   MR ABDOMEN MRCP WO CONTRAST  Result Date: 11/02/2020 CLINICAL DATA:  Worsening right upper quadrant and epigastric abdominal pain. Cholelithiasis. Acute pancreatitis. EXAM: MRI ABDOMEN WITHOUT CONTRAST  (INCLUDING MRCP) TECHNIQUE: Multiplanar multisequence MR imaging of the abdomen was performed. Heavily T2-weighted images of the biliary and pancreatic ducts were  obtained, and three-dimensional MRCP images were rendered by post processing. COMPARISON:  CT on 10/31/2020 FINDINGS: Lower chest: No acute findings. Hepatobiliary: No masses visualized on this unenhanced exam. Multiple gallstones are seen, largest measuring 2.4 cm. No evidence of gallbladder wall thickening or pericholecystic inflammatory changes. Borderline dilatation of common bile duct is seen measuring up to 6 mm. A few tiny calculi are seen in the distal common bile duct on images 24 - 28/series 12. Pancreas: Moderate edema is seen involving the pancreatic head and body with adjacent peripancreatic inflammatory changes, consistent with acute pancreatitis. Minimal peripancreatic fluid is seen in the retroperitoneum, however there is no evidence of pseudocyst. Spleen:  Within normal limits in size. Adrenals/Urinary tract: Unremarkable. No evidence of nephrolithiasis or hydronephrosis. Stomach/Bowel: Visualized portion unremarkable. Vascular/Lymphatic: No pathologically enlarged lymph nodes identified. No evidence of abdominal aortic aneurysm. Other:  None. Musculoskeletal:  No suspicious bone lesions identified. IMPRESSION: Moderate acute pancreatitis. No evidence of pseudocyst. Borderline dilatation of common bile duct, with tiny calculi in the distal common bile duct. Cholelithiasis. No radiographic evidence of cholecystitis. Electronically Signed   By: Marlaine Hind M.D.   On: 11/02/2020 08:10   MR 3D Recon At Scanner  Result Date: 11/02/2020 CLINICAL DATA:  Worsening right upper quadrant and epigastric abdominal pain. Cholelithiasis. Acute pancreatitis. EXAM: MRI ABDOMEN WITHOUT CONTRAST  (INCLUDING MRCP) TECHNIQUE: Multiplanar multisequence MR imaging of the abdomen was performed. Heavily T2-weighted images of the biliary and pancreatic ducts were obtained, and three-dimensional MRCP images were rendered by post processing. COMPARISON:  CT on 10/31/2020 FINDINGS: Lower chest: No acute findings.  Hepatobiliary: No masses visualized on this unenhanced exam. Multiple gallstones are seen, largest measuring 2.4 cm. No evidence of gallbladder wall thickening or pericholecystic inflammatory changes. Borderline dilatation of common bile duct is seen measuring up to 6 mm. A few tiny calculi are seen in the distal common bile duct on images 24 - 28/series 12. Pancreas: Moderate edema is seen involving the pancreatic head and body with adjacent peripancreatic inflammatory changes, consistent with acute pancreatitis. Minimal peripancreatic fluid is seen in the retroperitoneum, however there is no evidence of pseudocyst. Spleen:  Within normal limits in size. Adrenals/Urinary tract: Unremarkable. No evidence of nephrolithiasis or hydronephrosis.  Stomach/Bowel: Visualized portion unremarkable. Vascular/Lymphatic: No pathologically enlarged lymph nodes identified. No evidence of abdominal aortic aneurysm. Other:  None. Musculoskeletal:  No suspicious bone lesions identified. IMPRESSION: Moderate acute pancreatitis. No evidence of pseudocyst. Borderline dilatation of common bile duct, with tiny calculi in the distal common bile duct. Cholelithiasis. No radiographic evidence of cholecystitis. Electronically Signed   By: Marlaine Hind M.D.   On: 11/02/2020 08:10   DG ERCP BILIARY & PANCREATIC DUCTS  Result Date: 11/04/2020 CLINICAL DATA:  Choledocholithiasis Sphincterotomy and balloon sweep EXAM: ERCP TECHNIQUE: Multiple spot images obtained with the fluoroscopic device and submitted for interpretation post-procedure. FLUOROSCOPY TIME:  Fluoroscopy Time:  45 seconds Radiation Exposure Index (if provided by the fluoroscopic device): Not provided Number of Acquired Spot Images: 9 COMPARISON:  MRI/MRCP 11/02/2020 FINDINGS: Submitted intraoperative fluoroscopic images demonstrate cannulation and opacification of the intra and extrahepatic bile ducts. This is followed by a balloon sweep of the CBD. No filling defects  identified within the common bile duct on the final provided image. Evaluation of the mid duct is limited due to overlying scope. IMPRESSION: Intraoperative fluoroscopic images of ERCP as above. These images were submitted for radiologic interpretation only. Please see the procedural report for the amount of contrast and the fluoroscopy time utilized. Electronically Signed   By: Miachel Roux M.D.   On: 11/04/2020 13:27   US Abdomen Limited RUQ (LIVER/GB)  Result Date: 11/02/2020 CLINICAL DATA:  Thickened gallbladder walls on recent CT examination EXAM: ULTRASOUND ABDOMEN LIMITED RIGHT UPPER QUADRANT COMPARISON:  CT from 10/31/2020 FINDINGS: Gallbladder: Gallbladder is well distended with evidence of cholelithiasis. Negative sonographic Murphy's sign is elicited. Mild wall thickening is noted likely related to incomplete distension. Common bile duct: Diameter: 3.7 mm. Liver: No focal lesion identified. Within normal limits in parenchymal echogenicity. Portal vein is patent on color Doppler imaging with normal direction of blood flow towards the liver. Other: None. IMPRESSION: Cholelithiasis without definitive complicating factors. Negative sonographic Murphy's sign was elicited. Electronically Signed   By: Inez Catalina M.D.   On: 11/02/2020 01:34   US Abdomen Limited RUQ (LIVER/GB)  Result Date: 10/31/2020 CLINICAL DATA:  Right upper quadrant pain for 5 days. EXAM: ULTRASOUND ABDOMEN LIMITED RIGHT UPPER QUADRANT COMPARISON:  None. FINDINGS: Gallbladder: Physiologically distended. Shadowing intraluminal gallstone measuring 2.3 cm. Gallbladder wall thickness is upper normal at 2 to 3 mm. No pericholecystic fluid. No sonographic Murphy sign noted by sonographer. Common bile duct: Diameter: 5 mm, normal Liver: No focal lesion identified. Within normal limits in parenchymal echogenicity. Portal vein is patent on color Doppler imaging with normal direction of blood flow towards the liver. Other: No right upper  quadrant ascites. IMPRESSION: 1. Gallstone without sonographic findings of acute cholecystitis. 2. No biliary dilatation. Electronically Signed   By: Keith Rake M.D.   On: 10/31/2020 19:57     .Adin Hector, M.D., F.A.C.S. Gastrointestinal and Minimally Invasive Surgery [facility] [address] [place], [place] [address] [phone] Main / Paging  11/05/2020 2:50 PM    Adin Hector

## 2020-11-05 NOTE — Discharge Instructions (Addendum)
Your registered dietitian nutritionist (RDN) will ask you about your goals for weight management. You may have decided you want to lose a specific amount of weight. But losing weight--especially a larger amount of weight--can be an overwhelming task. A good first goal is to lose 5% to 10% of your current weight.  Your weight loss journey will take place over months, not days. Getting ongoing support from your RDN, primary care provider, and health care team during your weight loss journey is just as important as it is for other chronic conditions. Consider your care team a partner that knows you and will work with you to meet small goals, adjust your plan as needed, and keep you moving toward better health.  Why Is 5% to 10% the Recommended Weight Loss Goal? Improving your health is an important reason to change your eating habits and lose a few pounds. Losing 5% to 10% may not sound like much, but health does improve with just that amount of weight loss. You may see improvements in the following:  Blood pressure Blood sugar Cholesterol levels Sleep apnea symptoms Insulin resistance Amount of medications needed Chronic inflammation (extra fat creates chemicals that cause inflammation throughout your body) Joint pain Shortness of breath How to calculate 5% weight loss  Take the first two numbers of your current weight and divide by 2     Example:  If you weigh 198 pounds, divide 19 by 2 to get a 5% weight loss goal:  19  2 = 9.5 pounds  Your weight loss goal =  Small successes build confidence to achieve larger goals. Breaking down your overall goal into smaller targets can help you see your success as you go. Every 5-pound or 10-pound loss means you can celebrate achieving another goal.  Goals Beyond Weight Loss Goals for your weight management journey aren't limited to pounds measured on a scale. What else would you like to achieve? Come up with goals for improving your health,  too.  Examples of personal goals beyond weight loss:  "I will be able to lower my blood pressure or glucose with fewer medications." "I will have less knee pain." "I will be able to walk up the stairs without having to stop." "I will be proud of all my efforts to improve my health." You might also have specific physical activity goals. Start small with what you are able to do without feeling exhausted or out of breath. You may start with a 5-minute walk or doing some bicep curls with soup cans. From there, you might work up to at least 150 to 300 minutes of moderate intensity physical activity spread throughout the week or aim for muscle-strengthening activities on 2 or more days per week.  Write in your personal goals here:     1.        2.        3.     Your registered dietitian nutritionist (RDN) or physician has recommended you follow the general, healthful Mediterranean diet. A Mediterranean diet will help you to reach and stay at a healthy weight and/or improve your overall health.  This diet emphasizes plant-based foods like vegetables, fruits, grains, beans, peas, lentils, nuts, and seeds. These foods provide fiber, healthy fat, protein, vitamins, minerals, and other beneficial nutrients. The general, healthful Mediterranean diet may be lower in calories, sodium, added sugars, and saturated fat than other diets.  In addition to the general information presented here, your RDN may make recommendations just for you  based on your individual needs or personal and cultural preferences.  This diet emphasizes healthy fats, such as olives and olive oil, avocados, nuts, and seeds. Less healthy fats are limited. Less healthy fats include certain red meats, high-fat dairy products, and processed foods like donuts and other baked goods. The primary sources of protein in this diet are seafood, beans, peas, lentils, and nuts. Tips Here are some general tips for making healthy  choices:  Eat a variety of fruits and vegetables in a variety of colors every day. Be sure to include lots of dark green, red, blue-purple, and orange vegetables.   Choose whole grains for at least half your day's grain servings. Read labels to make sure the product is made from 100% of the grain. Whole wheat Brown rice Oats Barley Bulgur Cornmeal Get your protein mainly from seafood, beans, peas, soybeans, lentils, nuts and nut butters, and seeds and seed butters. You can also get protein from the following other sources, but they should be limited to small amounts: Lean red meat Poultry Eggs Fat-free and low-fat milk and yogurt Cheese   Use all oils and fats in moderation, up to five servings per day. Focus on heart-healthy fats. Avocados Olives and olive oil   Read product labels to help you avoid foods and beverages with added sugar, sodium, and saturated fats. Foods to Choose or to Limit The following foods are good choices for a general, healthful Mediterranean diet. Your RDN may make individualized portion size recommendations based on your needs.    General, Mediterranean Sample 1-Day Menu  Breakfast Omelet made with: 1 egg 1 egg white  cup spinach  cup asparagus, chopped  cup tomato, chopped 1 ounce feta cheese 1 orange 1 cup unsweetened coffee Lunch 1 cup lentil soup  cup tabouleh (parsley, bulgur salad)  whole grain pita bread  cup hummus  cup cucumber slices  cup olives 1 cup unsweetened tea Dinner 3 ounces grilled fish 1 cup cooked whole grain pasta with:  cup marinara sauce  cup fresh basil 1 ounce almonds  cup sauteed eggplant 1 cup spinach for salad 1 tablespoon olive oil  cup grapes Evening Snack 1 cup Greek yogurt  cup figs  From Academy of [employer] Dietetics   CCS ______[facility] LAPAROSCOPIC SURGERY: POST OP INSTRUCTIONS Always review your discharge instruction sheet given to you by the  facility where your surgery was performed. IF YOU HAVE DISABILITY OR FAMILY LEAVE FORMS, YOU MUST BRING THEM TO THE OFFICE FOR PROCESSING.   DO NOT GIVE THEM TO YOUR DOCTOR.  A prescription for pain medication may be given to you upon discharge.  Take your pain medication as prescribed, if needed.  If narcotic pain medicine is not needed, then you may take acetaminophen (Tylenol) or ibuprofen (Advil) as needed. Take your usually prescribed medications unless otherwise directed. If you need a refill on your pain medication, please contact your pharmacy.  They will contact our office to request authorization. Prescriptions will not be filled after 5pm or on week-ends. You should follow a light diet the first few days after arrival home, such as soup and crackers, etc.  Be sure to include lots of fluids daily. Most patients will experience some swelling and bruising in the area of the incisions.  Ice packs will help.  Swelling and bruising can take several days to resolve.  It is common to experience some constipation if taking pain medication after surgery.  Increasing fluid intake and taking a  stool softener (such as Colace) will usually help or prevent this problem from occurring.  A mild laxative (Milk of Magnesia or Miralax) should be taken according to package instructions if there are no bowel movements after 48 hours. Unless discharge instructions indicate otherwise, you may remove your bandages 24-48 hours after surgery, and you may shower at that time.  You may have steri-strips (small skin tapes) in place directly over the incision.  These strips should be left on the skin for 7-10 days.  If your surgeon used skin glue on the incision, you may shower in 24 hours.  The glue will flake off over the next 2-3 weeks.  Any sutures or staples will be removed at the office during your follow-up visit. ACTIVITIES:  You may resume regular (light) daily activities beginning the next day--such as daily  self-care, walking, climbing stairs--gradually increasing activities as tolerated.  You may have sexual intercourse when it is comfortable.  Refrain from any heavy lifting or straining until approved by your doctor. You may drive when you are no longer taking prescription pain medication, you can comfortably wear a seatbelt, and you can safely maneuver your car and apply brakes. RETURN TO WORK:  __________________________________________________________ Dennis Bast should see your doctor in the office for a follow-up appointment approximately 2-3 weeks after your surgery.  Make sure that you call for this appointment within a day or two after you arrive home to insure a convenient appointment time. OTHER INSTRUCTIONS: __________________________________________________________________________________________________________________________ __________________________________________________________________________________________________________________________ WHEN TO CALL YOUR DOCTOR: Fever over 101.0 Inability to urinate Continued bleeding from incision. Increased pain, redness, or drainage from the incision. Increasing abdominal pain  The clinic staff is available to answer your questions during regular business hours.  Please don't hesitate to call and ask to speak to one of the nurses for clinical concerns.  If you have a medical emergency, go to the nearest emergency room or call 911.  A surgeon from [facility] is always on call at the hospital. [address], [place], [place], [place]  [address] ? [address], [place], [place]   [address] [phone] ? [phone] ? FAX [phone] Web site: [email or web address]

## 2020-11-05 NOTE — Progress Notes (Signed)
Progress Note  1 Day Post-Op  Subjective: No complaints this am. NPO since midnight but tolerated diet well yesterday. No nausea or emesis. Has had a BM. Abdominal pain has resolved. Ambulating.    Objective: Vital signs in last 24 hours: Temp:  [98.3 F (36.8 C)-99.1 F (37.3 C)] 99.1 F (37.3 C) (08/19 0622) Pulse Rate:  [72-85] 72 (08/19 0622) Resp:  [12-20] 18 (08/19 0622) BP: (152-193)/(74-99) 152/74 (08/19 0622) SpO2:  [97 %-100 %] 100 % (08/19 0622) Weight:  [113.9 kg-116.3 kg] 113.9 kg (08/19 0622) Last BM Date: 11/04/20  Intake/Output from previous day: 08/18 0701 - 08/19 0700 In: 1332.1 [P.O.:240; I.V.:1042.1; IV Piggyback:50] Out: 900 [Urine:900] Intake/Output this shift: No intake/output data recorded.  PE: General: pleasant, WD, male who is laying in bed in NAD HEENT: head is normocephalic, atraumatic.  Mouth is pink and moist Heart: regular, rate, and rhythm.  Normal s1,s2. No obvious murmurs, gallops, or rubs noted.  Palpable radial and pedal pulses bilaterally Lungs: CTAB, no wheezes, rhonchi, or rales noted.  Respiratory effort nonlabored Abd: soft, NT, ND, +BS MSK: all 4 extremities are symmetrical with no cyanosis, clubbing, or edema.  No calf tenderness to palpation bilaterally Skin: warm and dry with no masses, lesions, or rashes Psych: A&Ox3 with an appropriate affect.    Lab Results:  Recent Labs    11/04/20 0539 11/05/20 0428  WBC 9.8 12.5*  HGB 12.8* 13.7  HCT 38.4* 41.6  PLT 289 331   BMET Recent Labs    11/04/20 0539 11/05/20 0428  NA 140 140  K 3.9 4.8  CL 107 107  CO2 26 25  GLUCOSE 119* 108*  BUN 6 10  CREATININE 0.98 0.96  CALCIUM 9.2 9.7   PT/INR No results for input(s): LABPROT, INR in the last 72 hours. CMP     Component Value Date/Time   NA 140 11/05/2020 0428   K 4.8 11/05/2020 0428   CL 107 11/05/2020 0428   CO2 25 11/05/2020 0428   GLUCOSE 108 (H) 11/05/2020 0428   BUN 10 11/05/2020 0428   CREATININE  0.96 11/05/2020 0428   CREATININE 1.07 06/14/2013 1112   CALCIUM 9.7 11/05/2020 0428   PROT 7.3 11/05/2020 0428   ALBUMIN 3.1 (L) 11/05/2020 0428   AST 24 11/05/2020 0428   ALT 46 (H) 11/05/2020 0428   ALKPHOS 145 (H) 11/05/2020 0428   BILITOT 1.5 (H) 11/05/2020 0428   GFRNONAA >60 11/05/2020 0428   Lipase     Component Value Date/Time   LIPASE 25 11/05/2020 0428       Studies/Results: DG ERCP BILIARY & PANCREATIC DUCTS  Result Date: 11/04/2020 CLINICAL DATA:  Choledocholithiasis Sphincterotomy and balloon sweep EXAM: ERCP TECHNIQUE: Multiple spot images obtained with the fluoroscopic device and submitted for interpretation post-procedure. FLUOROSCOPY TIME:  Fluoroscopy Time:  45 seconds Radiation Exposure Index (if provided by the fluoroscopic device): Not provided Number of Acquired Spot Images: 9 COMPARISON:  MRI/MRCP 11/02/2020 FINDINGS: Submitted intraoperative fluoroscopic images demonstrate cannulation and opacification of the intra and extrahepatic bile ducts. This is followed by a balloon sweep of the CBD. No filling defects identified within the common bile duct on the final provided image. Evaluation of the mid duct is limited due to overlying scope. IMPRESSION: Intraoperative fluoroscopic images of ERCP as above. These images were submitted for radiologic interpretation only. Please see the procedural report for the amount of contrast and the fluoroscopy time utilized. Electronically Signed   By: Danie Binder.D.  On: 11/04/2020 13:27    Anti-infectives: Anti-infectives (From admission, onward)    Start     Dose/Rate Route Frequency Ordered Stop   11/02/20 1000  piperacillin-tazobactam (ZOSYN) IVPB 3.375 g        3.375 g 12.5 mL/hr over 240 Minutes Intravenous Every 8 hours 11/02/20 0541     11/02/20 0330  cefTRIAXone (ROCEPHIN) 2 g in sodium chloride 0.9 % 100 mL IVPB  Status:  Discontinued        2 g 200 mL/hr over 30 Minutes Intravenous  Once 11/02/20 0319 11/02/20  0322   11/02/20 0330  piperacillin-tazobactam (ZOSYN) IVPB 3.375 g        3.375 g 100 mL/hr over 30 Minutes Intravenous  Once 11/02/20 0322 11/02/20 0404        Assessment/Plan  Gallstone pancreatitis - ERCP 8/18 Dr. Lyndel Safe: findings of 6 mm cbd stone and biliary sphincterotomy and balloon extraction performed - WBC 12.5 - T bili remains elevated at 1.5 but trending down   - plan for OR cholecystectomy today pending OR availability. Continue abx. Continue NPO for now   FEN:  NPO/IV fluids ID:  zosyn 8/16 > DVT: SCD's   Hypertension  - on a OTC supplement at home    LOS: 3 days    Winferd Humphrey, [facility] 11/05/2020, 8:46 AM Please see Amion for pager number during day hours 7:00am-4:30pm

## 2020-11-05 NOTE — Anesthesia Procedure Notes (Signed)
Procedure Name: Intubation Date/Time: 11/05/2020 3:37 PM Performed by: Gean Maidens, CRNA Pre-anesthesia Checklist: Patient identified, Emergency Drugs available, Suction available, Patient being monitored and Timeout performed Patient Re-evaluated:Patient Re-evaluated prior to induction Oxygen Delivery Method: Circle system utilized Preoxygenation: Pre-oxygenation with 100% oxygen Induction Type: IV induction Ventilation: Mask ventilation without difficulty Laryngoscope Size: Mac and 3 Grade View: Grade II Tube type: Oral Tube size: 7.5 mm Number of attempts: 1 Airway Equipment and Method: Stylet Placement Confirmation: ETT inserted through vocal cords under direct vision, positive ETCO2 and breath sounds checked- equal and bilateral Secured at: 23 cm Tube secured with: Tape Dental Injury: Teeth and Oropharynx as per pre-operative assessment

## 2020-11-05 NOTE — Transfer of Care (Signed)
Immediate Anesthesia Transfer of Care Note  Patient: Sean Valencia  Procedure(s) Performed: LAPAROSCOPIC CHOLECYSTECTOMY SINGLE SITE (Abdomen)  Patient Location: PACU  Anesthesia Type:General  Level of Consciousness: sedated, patient cooperative and responds to stimulation  Airway & Oxygen Therapy: Patient Spontanous Breathing and Patient connected to face mask oxygen  Post-op Assessment: Report given to RN and Post -op Vital signs reviewed and stable  Post vital signs: Reviewed and stable  Last Vitals:  Vitals Value Taken Time  BP 156/75 11/05/20 1716  Temp    Pulse 71 11/05/20 1717  Resp 18 11/05/20 1717  SpO2 98 % 11/05/20 1717  Vitals shown include unvalidated device data.  Last Pain:  Vitals:   11/05/20 0824  TempSrc:   PainSc: 0-No pain      Patients Stated Pain Goal: 0 ([id number])  Complications: No notable events documented.

## 2020-11-05 NOTE — Progress Notes (Signed)
PROGRESS NOTE   KAITLYN MCELHENNY  [id number] DOB: 06-10-1960 DOA: 11/01/2020 PCP: Patient, No Pcp Per (Inactive)  Brief Narrative:  60 year old black male HTN not on meds, prior diverticulosis with small tubular adenomas Several months persistent abdominal pain right upper quadrant epigastric area Presented initially 8/14 lipase is 67 LFTs slightly elevated CT abdomen showed acute pancreatitis but patient had to go home-represented 8/16 Found to have acute pancreatitis GI consulted MRCP showed moderate pancreatitis borderline CBD dilatation  ERCP scheduled for 8/18 and then possible cholecystectomy as per general surgery  Hospital-Problem based course  Acute gallstone pancreatitis with likely past CBD stone No complicating features ERCP 8/18 showed multiple stones General surgery consulted and planning for lap chole today if possible LFTs are completely resolved HTN Poorly controlled amlodipine 10 mg   DVT prophylaxis: Lovenox Code Status: Full Family Communication: None Disposition:  Status is: Inpatient  Remains inpatient appropriate because:Hemodynamically unstable and Unsafe d/c plan  Dispo: The patient is from: Home              Anticipated d/c is to: Home              Patient currently is not medically stable to d/c.   Difficult to place patient No    Consultants:  General surgery GI  Procedures:   Antimicrobials:     Subjective:  Ambulating the halls no distress tolerating n.p.o. status again for the third day "I want to go home"  Objective: Vitals:   11/04/20 1345 11/04/20 1414 11/04/20 2155 11/05/20 0622  BP: (!) 159/95 (!) 163/89 (!) 157/83 (!) 152/74  Pulse:  74 74 72  Resp: '16 18 20 18  '$ Temp:  98.4 F (36.9 C) 98.4 F (36.9 C) 99.1 F (37.3 C)  TempSrc:  Oral Oral Oral  SpO2: 97% 100% 99% 100%  Weight:    113.9 kg  Height:        Intake/Output Summary (Last 24 hours) at 11/05/2020 1003 Last data filed at 11/05/2020 0415 Gross per 24  hour  Intake 1332.1 ml  Output 500 ml  Net 832.1 ml    Filed Weights   11/03/20 0500 11/04/20 1135 11/05/20 0622  Weight: 116.3 kg 116.3 kg 113.9 kg    Examination:  Awake coherent no distress EOMI NCAT no focal deficit CTA B no added sound no rales no rhonchi ROM intact No lower extremity edema Chest clear no rales no rhonchi Neurologically intact moving all 4 limbs ambulatory in hallway  Data Reviewed: personally reviewed   CBC    Component Value Date/Time   WBC 12.5 (H) 11/05/2020 0428   RBC 4.72 11/05/2020 0428   HGB 13.7 11/05/2020 0428   HCT 41.6 11/05/2020 0428   PLT 331 11/05/2020 0428   MCV 88.1 11/05/2020 0428   MCV 88.5 06/14/2013 1113   MCH 29.0 11/05/2020 0428   MCHC 32.9 11/05/2020 0428   RDW 13.6 11/05/2020 0428   LYMPHSABS 1.6 11/05/2020 0428   MONOABS 0.5 11/05/2020 0428   EOSABS 0.0 11/05/2020 0428   BASOSABS 0.0 11/05/2020 0428   CMP Latest Ref Rng & Units 11/05/2020 11/04/2020 11/03/2020  Glucose 70 - 99 mg/dL 108(H) 119(H) 117(H)  BUN 6 - 20 mg/dL '10 6 8  '$ Creatinine 0.61 - 1.24 mg/dL 0.96 0.98 0.87  Sodium 135 - 145 mmol/L 140 140 140  Potassium 3.5 - 5.1 mmol/L 4.8 3.9 3.8  Chloride 98 - 111 mmol/L 107 107 105  CO2 22 - 32 mmol/L 25 26 27  Calcium 8.9 - 10.3 mg/dL 9.7 9.2 9.0  Total Protein 6.5 - 8.1 g/dL 7.3 6.5 6.6  Total Bilirubin 0.3 - 1.2 mg/dL 1.5(H) 1.7(H) 1.9(H)  Alkaline Phos 38 - 126 U/L 145(H) 140(H) 151(H)  AST 15 - 41 U/L '24 19 20  '$ ALT 0 - 44 U/L 46(H) 46(H) 60(H)     Radiology Studies: DG ERCP BILIARY & PANCREATIC DUCTS  Result Date: 11/04/2020 CLINICAL DATA:  Choledocholithiasis Sphincterotomy and balloon sweep EXAM: ERCP TECHNIQUE: Multiple spot images obtained with the fluoroscopic device and submitted for interpretation post-procedure. FLUOROSCOPY TIME:  Fluoroscopy Time:  45 seconds Radiation Exposure Index (if provided by the fluoroscopic device): Not provided Number of Acquired Spot Images: 9 COMPARISON:  MRI/MRCP  11/02/2020 FINDINGS: Submitted intraoperative fluoroscopic images demonstrate cannulation and opacification of the intra and extrahepatic bile ducts. This is followed by a balloon sweep of the CBD. No filling defects identified within the common bile duct on the final provided image. Evaluation of the mid duct is limited due to overlying scope. IMPRESSION: Intraoperative fluoroscopic images of ERCP as above. These images were submitted for radiologic interpretation only. Please see the procedural report for the amount of contrast and the fluoroscopy time utilized. Electronically Signed   By: Miachel Roux M.D.   On: 11/04/2020 13:27     Scheduled Meds:  amLODipine  10 mg Oral Daily   Continuous Infusions:  sodium chloride Stopped (11/04/20 1112)   piperacillin-tazobactam (ZOSYN)  IV 3.375 g (11/05/20 0923)     LOS: 3 days   Time spent: 62  Nita Sells, MD [facility] To contact the attending provider between 7A-7P or the covering provider during after hours 7P-7A, please log into the web site [email or web address] and access using universal [place] password for that web site. If you do not have the password, please call the hospital operator.  11/05/2020, 10:03 AM

## 2020-11-05 NOTE — H&P (View-Only) (Signed)
Progress Note  1 Day Post-Op  Subjective: No complaints this am. NPO since midnight but tolerated diet well yesterday. No nausea or emesis. Has had a BM. Abdominal pain has resolved. Ambulating.    Objective: Vital signs in last 24 hours: Temp:  [98.3 F (36.8 C)-99.1 F (37.3 C)] 99.1 F (37.3 C) (08/19 0622) Pulse Rate:  [72-85] 72 (08/19 0622) Resp:  [12-20] 18 (08/19 0622) BP: (152-193)/(74-99) 152/74 (08/19 0622) SpO2:  [97 %-100 %] 100 % (08/19 0622) Weight:  [113.9 kg-116.3 kg] 113.9 kg (08/19 0622) Last BM Date: 11/04/20  Intake/Output from previous day: 08/18 0701 - 08/19 0700 In: 1332.1 [P.O.:240; I.V.:1042.1; IV Piggyback:50] Out: 900 [Urine:900] Intake/Output this shift: No intake/output data recorded.  PE: General: pleasant, WD, male who is laying in bed in NAD HEENT: head is normocephalic, atraumatic.  Mouth is pink and moist Heart: regular, rate, and rhythm.  Normal s1,s2. No obvious murmurs, gallops, or rubs noted.  Palpable radial and pedal pulses bilaterally Lungs: CTAB, no wheezes, rhonchi, or rales noted.  Respiratory effort nonlabored Abd: soft, NT, ND, +BS MSK: all 4 extremities are symmetrical with no cyanosis, clubbing, or edema.  No calf tenderness to palpation bilaterally Skin: warm and dry with no masses, lesions, or rashes Psych: A&Ox3 with an appropriate affect.    Lab Results:  Recent Labs    11/04/20 0539 11/05/20 0428  WBC 9.8 12.5*  HGB 12.8* 13.7  HCT 38.4* 41.6  PLT 289 331   BMET Recent Labs    11/04/20 0539 11/05/20 0428  NA 140 140  K 3.9 4.8  CL 107 107  CO2 26 25  GLUCOSE 119* 108*  BUN 6 10  CREATININE 0.98 0.96  CALCIUM 9.2 9.7   PT/INR No results for input(s): LABPROT, INR in the last 72 hours. CMP     Component Value Date/Time   NA 140 11/05/2020 0428   K 4.8 11/05/2020 0428   CL 107 11/05/2020 0428   CO2 25 11/05/2020 0428   GLUCOSE 108 (H) 11/05/2020 0428   BUN 10 11/05/2020 0428   CREATININE  0.96 11/05/2020 0428   CREATININE 1.07 06/14/2013 1112   CALCIUM 9.7 11/05/2020 0428   PROT 7.3 11/05/2020 0428   ALBUMIN 3.1 (L) 11/05/2020 0428   AST 24 11/05/2020 0428   ALT 46 (H) 11/05/2020 0428   ALKPHOS 145 (H) 11/05/2020 0428   BILITOT 1.5 (H) 11/05/2020 0428   GFRNONAA >60 11/05/2020 0428   Lipase     Component Value Date/Time   LIPASE 25 11/05/2020 0428       Studies/Results: DG ERCP BILIARY & PANCREATIC DUCTS  Result Date: 11/04/2020 CLINICAL DATA:  Choledocholithiasis Sphincterotomy and balloon sweep EXAM: ERCP TECHNIQUE: Multiple spot images obtained with the fluoroscopic device and submitted for interpretation post-procedure. FLUOROSCOPY TIME:  Fluoroscopy Time:  45 seconds Radiation Exposure Index (if provided by the fluoroscopic device): Not provided Number of Acquired Spot Images: 9 COMPARISON:  MRI/MRCP 11/02/2020 FINDINGS: Submitted intraoperative fluoroscopic images demonstrate cannulation and opacification of the intra and extrahepatic bile ducts. This is followed by a balloon sweep of the CBD. No filling defects identified within the common bile duct on the final provided image. Evaluation of the mid duct is limited due to overlying scope. IMPRESSION: Intraoperative fluoroscopic images of ERCP as above. These images were submitted for radiologic interpretation only. Please see the procedural report for the amount of contrast and the fluoroscopy time utilized. Electronically Signed   By: Danie Binder.D.  On: 11/04/2020 13:27    Anti-infectives: Anti-infectives (From admission, onward)    Start     Dose/Rate Route Frequency Ordered Stop   11/02/20 1000  piperacillin-tazobactam (ZOSYN) IVPB 3.375 g        3.375 g 12.5 mL/hr over 240 Minutes Intravenous Every 8 hours 11/02/20 0541     11/02/20 0330  cefTRIAXone (ROCEPHIN) 2 g in sodium chloride 0.9 % 100 mL IVPB  Status:  Discontinued        2 g 200 mL/hr over 30 Minutes Intravenous  Once 11/02/20 0319 11/02/20  0322   11/02/20 0330  piperacillin-tazobactam (ZOSYN) IVPB 3.375 g        3.375 g 100 mL/hr over 30 Minutes Intravenous  Once 11/02/20 0322 11/02/20 0404        Assessment/Plan  Gallstone pancreatitis - ERCP 8/18 Dr. Lyndel Safe: findings of 6 mm cbd stone and biliary sphincterotomy and balloon extraction performed - WBC 12.5 - T bili remains elevated at 1.5 but trending down   - plan for OR cholecystectomy today pending OR availability. Continue abx. Continue NPO for now   FEN:  NPO/IV fluids ID:  [email or web address] 8/16 > DVT: SCD's   Hypertension  - on a OTC supplement at home    LOS: 3 days    Winferd Humphrey, [facility] 11/05/2020, 8:46 AM Please see Amion for pager number during day hours 7:00am-4:30pm

## 2020-11-05 NOTE — Progress Notes (Signed)
Initial Nutrition Assessment  DOCUMENTATION CODES:   Obesity unspecified  INTERVENTION:   -Will provide diet handouts in discharge instructions -Will follow-up with patient regarding education, if still admitted next week.  NUTRITION DIAGNOSIS:   Increased nutrient needs related to acute illness as evidenced by estimated needs.  GOAL:   Patient will meet greater than or equal to 90% of their needs  MONITOR:   PO intake, Supplement acceptance, Diet advancement, Labs, Weight trends, I & O's  REASON FOR ASSESSMENT:   Consult Diet education  ASSESSMENT:   60 y.o. adult with history of hypertension presently not on any medication presents to the ER with complaints of persistent abdominal pain.  Patient has been having abdominal pain for the last few months but over the last 1 week it became more persistent. CT abdomen pelvis showed features concerning for acute pancreatitis.  8/18 s/p ERCP  Patient currently NPO and in OR for cholecystectomy. Pt has been NPO since admission for various procedures. Pt has reported feeling hungry.  RD was consulted to provide diet education for life goals. Will provide healthy nutrition and pancreatitis diet information. Will attempt to follow-up with patient next week when available and diet is advanced.  Per weight records, pt has lost 5 lbs since May 2022, insignificant for time frame.  Medications: Lactated ringers  Labs reviewed: CBGs: 95-140   NUTRITION - FOCUSED PHYSICAL EXAM:  Unable to complete  Diet Order:   Diet Order             Diet NPO time specified  Diet effective midnight                   EDUCATION NEEDS:   Not appropriate for education at this time  Skin:  Skin Assessment: Reviewed RN Assessment  Last BM:  8/18  Height:   Ht Readings from Last 1 Encounters:  11/04/20 '5\' 11"'$  (1.803 m)    Weight:   Wt Readings from Last 1 Encounters:  11/05/20 113.9 kg    BMI:  Body mass index is 35.02  kg/m.  Estimated Nutritional Needs:   Kcal:  2150-2350  Protein:  95-110g  Fluid:  2.1L/day  Clayton Bibles, MS, RD, LDN Inpatient Clinical Dietitian Contact information available via Amion

## 2020-11-05 NOTE — Op Note (Signed)
11/05/2020  PATIENT:  Sean Valencia  60 y.o. adult  Patient Care Team: Patient, No Pcp Per (Inactive) as PCP - General (General Practice)  PRE-OPERATIVE DIAGNOSIS:    Acute on Chronic Calculus Cholecystitis Gallstone pancreatitis. Choledocholithiasis status post ERCP.  POST-OPERATIVE DIAGNOSIS:   Acute on Chronic Calculus Cholecystitis Gallstone pancreatitis. Choledocholithiasis status post ERCP.   PROCEDURE:  Laparoscopic cholecystectomy (CPT code [phone])  SURGEON:  Adin Hector, MD, FACS.  ASSISTANT: OR Staff   ANESTHESIA:    General with endotracheal intubation Local anesthetic as a field block  EBL:  (See Anesthesia Intraoperative Record) Total I/O In: 1255.9 [I.V.:1107.5; IV Piggyback:148.4] Out: 25 [Blood:25]  Delay start of Pharmacological VTE agent (>24hrs) due to surgical blood loss or risk of bleeding:  no  DRAINS: 19 Fr Blake closed bulb drain in the RUQ   SPECIMEN: Gallbladder    DISPOSITION OF SPECIMEN:  PATHOLOGY  COUNTS:  YES  PLAN OF CARE: Admit to inpatient   PATIENT DISPOSITION:  PACU - hemodynamically stable.  INDICATION: Gentleman with abdominal pain and found to have cholecystitis with choledocholithiasis and gallstone pancreatitis.  Stabilized.  ERCP with evacuation of common bile duct stone.  Interval cholecystectomy recommended day after ERCP.  The anatomy & physiology of hepatobiliary & pancreatic function was discussed.  The pathophysiology of gallbladder dysfunction was discussed.  Natural history risks without surgery was discussed.   I feel the risks of no intervention will lead to serious problems that outweigh the operative risks; therefore, I recommended cholecystectomy to remove the pathology.  I explained laparoscopic techniques with possible need for an open approach.  Probable cholangiogram to evaluate the bilary tract was explained as well.    Risks such as bleeding, infection, abscess, leak, injury to other organs, need for  further treatment, heart attack, death, and other risks were discussed.  I noted a good likelihood this will help address the problem.  Possibility that this will not correct all abdominal symptoms was explained.  Goals of post-operative recovery were discussed as well.  We will work to minimize complications.  An educational handout further explaining the pathology and treatment options was given as well.  Questions were answered.  The patient expresses understanding & wishes to proceed with surgery.  OR FINDINGS: Contracted edematous gallbladder consistent with acute on chronic cholecystitis.  Very fibrotic infundibulum and extremely short cystic duct.  Dome down cholecystectomy with Endoloop ligation of cystic duct and infundibulum done.  Drain placed given short tenuous cystic duct.  Liver: normal  DESCRIPTION:   The patient was identified & brought in the operating room. The patient was positioned supine with arms tucked. SCDs were active during the entire case. The patient underwent general anesthesia without any difficulty.  The abdomen was prepped and draped in a sterile fashion. A Surgical Timeout confirmed our plan.  I made a transverse curvilinear incision through the superior umbilical fold.  I placed a 56m long port through the supraumbilical fascia using a modified Hassan cutdown technique with umbilical stalk fascial countertraction. I began carbon dioxide insufflation.  No change in end tidal CO2 measurement.   Camera inspection revealed no injury. There were no adhesions to the anterior abdominal wall supraumbilically.  I proceeded to continue with single site technique. I placed a #5 port in left upper aspect of the wound. I placed a 5 mm atraumatic grasper in the right inferior aspect of the wound.  I turned attention to the right upper quadrant.  Gallbladder wall had some thickening consistent  with acute on chronic cholecystitis with some edema.  The gallbladder fundus was elevated  cephalad. I freed adhesions to the ventral surface of the gallbladder off carefully.  I freed the peritoneal coverings between the gallbladder and the liver on the posteriolateral and anteriomedial walls. I alternated between Harmonic & blunt Maryland dissection to help get a good critical view of the cystic artery and cystic duct.  Some of the tissues are somewhat ischemic.  In trying to dissect around his infundibulum which was very thickened with poor planes I did have a breach into the gallbladder just proximal to the infundibulum with spillage of a few stones.  I placed another 5 mm port in the right mid abdomen since I suspected I was going to need to leave a drain and needed help with dissection.  I decided to transition to a dome down technique to free the gallbladder for its remaining attachments to the liver.  I then worked to [employer] around the infundibulum.  Seem like he had a rather short 15 mm broad cystic duct.  I was able to skeletonize some lymphatic and anterior posterior cystic duct branches.  Skeletonized down to the level of the common bile duct.  Cystic duct was too thickened to tolerate clip so I lassoed it using a 0 PDS Endoloop around the gallbladder down to the cystic duct/common bile duct junction and ligated.  I did another ligation at the infundibulum/cystic duct junction.  I then transected the infundibulum of the gallbladder off the very foreshortened cystic duct.   I placed the gallbladder and the few spilled stones inside the [employer].  I did irrigation and an meticulous inspection to ensure there were no other stones.  I ensured hemostasis on the gallbladder fossa of the liver and elsewhere. I inspected the rest of the abdomen & detected no injury nor bleeding elsewhere.  PDS Endoloops ties were intact on the cystic duct stump.  I do not see any injury to the common bile duct.  No bile on the hepatic fossa.  I do not see any exposed extrahepatic right or left ducts.  However,  because it was thickened inflamed with a shortened duct with ligation of the stump I decided to leave a 72 [place] Blake drain through a right paramedian port site.  I removed the gallbladder out the supraumbilical fascia. 2cm fascial defect needed since largest stone was 2cm.  I closed the fascia transversely using #1 PDS interrupted stitches. I closed the skin using 4-0 monocryl stitch.  Sterile dressing was applied. The patient was extubated & arrived in the PACU in stable condition.  I suspect he can have antibiotics just overnight but probably should leave the drain for 10 days to rule out the risk of a delayed leak given his poor tissues and very shortened & thickened cystic duct.  I had discussed postoperative care with the patient in the holding area. I made an attempt to locate & reach the desired patient contact to discuss the patient's overall status and my recommendations.  I got his wife's voicemail but the voicemail box was full.  No one is available at this time.  My plans & instructions have been written in the chart.  Adin Hector, M.D., F.A.C.S. Gastrointestinal and Minimally Invasive Surgery [facility] [address] [place], [place] [address] [phone] Main / Paging  11/05/2020 5:16 PM

## 2020-11-05 NOTE — Anesthesia Preprocedure Evaluation (Addendum)
Anesthesia Evaluation  Patient identified by MRN, date of birth, ID band Patient awake    Reviewed: Allergy & Precautions, NPO status , Patient's Chart, lab work & pertinent test results  Airway Mallampati: II  TM Distance: >3 FB Neck ROM: Full    Dental  (+) Teeth Intact   Pulmonary neg pulmonary ROS,    Pulmonary exam normal        Cardiovascular hypertension,  Rhythm:Regular Rate:Normal     Neuro/Psych negative neurological ROS  negative psych ROS   GI/Hepatic Neg liver ROS, Gallstone pancreatitis    Endo/Other  negative endocrine ROS  Renal/GU History of stones  negative genitourinary   Musculoskeletal  (+) Arthritis , Osteoarthritis,    Abdominal (+)  Abdomen: soft. Bowel sounds: normal.  Peds  Hematology negative hematology ROS (+)   Anesthesia Other Findings   Reproductive/Obstetrics                             Anesthesia Physical Anesthesia Plan  ASA: 2  Anesthesia Plan: General   Post-op Pain Management:    Induction: Intravenous  PONV Risk Score and Plan: 2 and Ondansetron, Dexamethasone, Midazolam and Treatment may vary due to age or medical condition  Airway Management Planned: Mask and Oral ETT  Additional Equipment: None  Intra-op Plan:   Post-operative Plan: Extubation in OR  Informed Consent: I have reviewed the patients History and Physical, chart, labs and discussed the procedure including the risks, benefits and alternatives for the proposed anesthesia with the patient or authorized representative who has indicated his/her understanding and acceptance.     Dental advisory given  Plan Discussed with: CRNA  Anesthesia Plan Comments: (Lab Results      Component                Value               Date                      WBC                      12.5 (H)            11/05/2020                HGB                      13.7                11/05/2020                 HCT                      41.6                11/05/2020                MCV                      88.1                11/05/2020                PLT                      331  11/05/2020           Lab Results      Component                Value               Date                      NA                       140                 11/05/2020                K                        4.8                 11/05/2020                CO2                      25                  11/05/2020                GLUCOSE                  108 (H)             11/05/2020                BUN                      10                  11/05/2020                CREATININE               0.96                11/05/2020                CALCIUM                  9.7                 11/05/2020                GFRNONAA                 >60                 11/05/2020           Lab Results      Component                Value               Date                      ALT                      46 (H)              11/05/2020  AST                      24                  11/05/2020                ALKPHOS                  145 (H)             11/05/2020                BILITOT                  1.5 (H)             11/05/2020          )        Anesthesia Quick Evaluation

## 2020-11-05 NOTE — Anesthesia Postprocedure Evaluation (Signed)
Anesthesia Post Note  Patient: Sean Valencia  Procedure(s) Performed: LAPAROSCOPIC CHOLECYSTECTOMY SINGLE SITE (Abdomen)     Patient location during evaluation: PACU Anesthesia Type: General Level of consciousness: awake and alert and oriented Pain management: pain level controlled Vital Signs Assessment: post-procedure vital signs reviewed and stable Respiratory status: spontaneous breathing, nonlabored ventilation and respiratory function stable Cardiovascular status: blood pressure returned to baseline Postop Assessment: no apparent nausea or vomiting Anesthetic complications: no   No notable events documented.  Last Vitals:  Vitals:   11/05/20 1745 11/05/20 1800  BP: (!) 142/67 137/79  Pulse: 61 (!) 58  Resp: 19 15  Temp:    SpO2: 97% 96%    Last Pain:  Vitals:   11/05/20 1800  TempSrc:   PainSc: Asleep                 Marthenia Rolling

## 2020-11-06 ENCOUNTER — Encounter (HOSPITAL_COMMUNITY): Payer: Self-pay | Admitting: Surgery

## 2020-11-06 LAB — CBC WITH DIFFERENTIAL/PLATELET
Abs Immature Granulocytes: 0.21 10*3/uL — ABNORMAL HIGH (ref 0.00–0.07)
Basophils Absolute: 0 10*3/uL (ref 0.0–0.1)
Basophils Relative: 0 %
Eosinophils Absolute: 0 10*3/uL (ref 0.0–0.5)
Eosinophils Relative: 0 %
HCT: 41.9 % (ref 39.0–52.0)
Hemoglobin: 13.6 g/dL (ref 13.0–17.0)
Immature Granulocytes: 1 %
Lymphocytes Relative: 8 %
Lymphs Abs: 1.4 10*3/uL (ref 0.7–4.0)
MCH: 28.9 pg (ref 26.0–34.0)
MCHC: 32.5 g/dL (ref 30.0–36.0)
MCV: 89 fL (ref 80.0–100.0)
Monocytes Absolute: 0.5 10*3/uL (ref 0.1–1.0)
Monocytes Relative: 3 %
Neutro Abs: 15.8 10*3/uL — ABNORMAL HIGH (ref 1.7–7.7)
Neutrophils Relative %: 88 %
Platelets: 362 10*3/uL (ref 150–400)
RBC: 4.71 MIL/uL (ref 4.22–5.81)
RDW: 13.8 % (ref 11.5–15.5)
WBC: 18 10*3/uL — ABNORMAL HIGH (ref 4.0–10.5)
nRBC: 0 % (ref 0.0–0.2)

## 2020-11-06 LAB — COMPREHENSIVE METABOLIC PANEL
ALT: 87 U/L — ABNORMAL HIGH (ref 0–44)
AST: 80 U/L — ABNORMAL HIGH (ref 15–41)
Albumin: 3.2 g/dL — ABNORMAL LOW (ref 3.5–5.0)
Alkaline Phosphatase: 135 U/L — ABNORMAL HIGH (ref 38–126)
Anion gap: 10 (ref 5–15)
BUN: 13 mg/dL (ref 6–20)
CO2: 26 mmol/L (ref 22–32)
Calcium: 9.4 mg/dL (ref 8.9–10.3)
Chloride: 103 mmol/L (ref 98–111)
Creatinine, Ser: 0.98 mg/dL (ref 0.61–1.24)
GFR, Estimated: >60 mL/min (ref >60)
Glucose, Bld: 105 mg/dL — ABNORMAL HIGH (ref 70–99)
Potassium: 4.6 mmol/L (ref 3.5–5.1)
Sodium: 139 mmol/L (ref 135–145)
Total Bilirubin: 1.5 mg/dL — ABNORMAL HIGH (ref 0.3–1.2)
Total Protein: 7.5 g/dL (ref 6.5–8.1)

## 2020-11-06 LAB — GLUCOSE, CAPILLARY
Glucose-Capillary: 84 mg/dL (ref 70–99)
Glucose-Capillary: 146 mg/dL — ABNORMAL HIGH (ref 70–99)
Glucose-Capillary: 108 mg/dL — ABNORMAL HIGH (ref 70–99)

## 2020-11-06 MED ORDER — OXYCODONE-ACETAMINOPHEN 5-325 MG PO TABS
1.0000 | ORAL_TABLET | ORAL | Status: DC | PRN
  Administered 2020-11-06 (×2): 1 via ORAL
  Filled 2020-11-06 (×2): qty 1

## 2020-11-06 NOTE — Progress Notes (Signed)
Clarified diet with Dr. Dema Severin. MD ok to have pt continue on heart healthy as long as pt's not NV.

## 2020-11-06 NOTE — Progress Notes (Signed)
PROGRESS NOTE   Sean Valencia  [id number] DOB: [birth year] DOA: 11/01/2020 PCP: Patient, No Pcp Per (Inactive)  Brief Narrative:  60 year old black male HTN not on meds, prior diverticulosis with small tubular adenomas Several months persistent abdominal pain right upper quadrant epigastric area Presented initially 8/14 lipase is 67 LFTs slightly elevated CT abdomen showed acute pancreatitis but patient had to go home-represented 8/16 Found to have acute pancreatitis GI consulted MRCP showed moderate pancreatitis borderline CBD dilatation  ERCP done on 8/18 cholecystectomy 8/19-has developed postop ileus-has a drain in place-watching white count-if continues to do well can probably discharge in 24 to 48 hours  Hospital-Problem based course  Acute gallstone pancreatitis with likely past CBD stone No complicating features ERCP 8/18 showed multiple stones lap chole performed 8/19 Has leukocytosis today but without fever and a drain in place-watch for fever--has been on Zosyn since 8/16 need to de-escalate per general surgery Okay for regular diet per general surgery Continue pain control-transitioning from Dilaudid to Percocet ?  Postop ileus Watch for bowel function-get x-ray a.m. of abdomen to clarify HTN Poorly controlled amlodipine 10 mg   DVT prophylaxis: Lovenox Code Status: Full Family Communication: None Disposition:  Status is: Inpatient  Remains inpatient appropriate because:Hemodynamically unstable and Unsafe d/c plan  Dispo: The patient is from: Home              Anticipated d/c is to: Home              Patient currently is not medically stable to d/c.   Difficult to place patient No    Consultants:  General surgery GI  Procedures:   Antimicrobials:     Subjective:  Moderate abdominal pain Tolerating diet however no nausea no vomiting No chest pain  Objective: Vitals:   11/06/20 0150 11/06/20 0428 11/06/20 0933 11/06/20 1333  BP: (!) 157/96  (!) 170/76 (!) 171/87 (!) 178/82  Pulse: 70 65 62 67  Resp: '16 18 18 18  '$ Temp: 98.1 F (36.7 C) 99.1 F (37.3 C) 98.6 F (37 C) 98.6 F (37 C)  TempSrc: Oral Oral Oral Oral  SpO2: 97% 99% 97% 100%  Weight:      Height:        Intake/Output Summary (Last 24 hours) at 11/06/2020 1530 Last data filed at 11/06/2020 1400 Gross per 24 hour  Intake 1939.96 ml  Output 1415 ml  Net 524.96 ml    Filed Weights   11/03/20 0500 11/04/20 1135 11/05/20 0622  Weight: 116.3 kg 116.3 kg 113.9 kg    Examination:  Pleasant coherent no distress Chest clear no rales rhonchi Abdomen distended no rebound no guarding postop scars noted Neurologically intact no focal deficit  Data Reviewed: personally reviewed   CBC    Component Value Date/Time   WBC 18.0 (H) 11/06/2020 0447   RBC 4.71 11/06/2020 0447   HGB 13.6 11/06/2020 0447   HCT 41.9 11/06/2020 0447   PLT 362 11/06/2020 0447   MCV 89.0 11/06/2020 0447   MCV 88.5 06/14/2013 1113   MCH 28.9 11/06/2020 0447   MCHC 32.5 11/06/2020 0447   RDW 13.8 11/06/2020 0447   LYMPHSABS 1.4 11/06/2020 0447   MONOABS 0.5 11/06/2020 0447   EOSABS 0.0 11/06/2020 0447   BASOSABS 0.0 11/06/2020 0447   CMP Latest Ref Rng & Units 11/06/2020 11/05/2020 11/04/2020  Glucose 70 - 99 mg/dL 105(H) 108(H) 119(H)  BUN 6 - 20 mg/dL '13 10 6  '$ Creatinine 0.61 - 1.24 mg/dL 0.98 0.96 0.98  Sodium 135 - 145 mmol/L 139 140 140  Potassium 3.5 - 5.1 mmol/L 4.6 4.8 3.9  Chloride 98 - 111 mmol/L 103 107 107  CO2 22 - 32 mmol/L '26 25 26  '$ Calcium 8.9 - 10.3 mg/dL 9.4 9.7 9.2  Total Protein 6.5 - 8.1 g/dL 7.5 7.3 6.5  Total Bilirubin 0.3 - 1.2 mg/dL 1.5(H) 1.5(H) 1.7(H)  Alkaline Phos 38 - 126 U/L 135(H) 145(H) 140(H)  AST 15 - 41 U/L 80(H) 24 19  ALT 0 - 44 U/L 87(H) 46(H) 46(H)     Radiology Studies: No results found.   Scheduled Meds:  acetaminophen  1,000 mg Oral Q6H   amLODipine  10 mg Oral Daily   lip balm  1 application Topical BID   polycarbophil  625  mg Oral BID   sodium chloride flush  3 mL Intravenous Q12H   Continuous Infusions:  sodium chloride Stopped (11/04/20 1112)   sodium chloride     lactated ringers     methocarbamol (ROBAXIN) IV     piperacillin-tazobactam (ZOSYN)  IV 3.375 g (11/06/20 0859)     LOS: 4 days   Time spent: 49  Nita Sells, MD [facility] To contact the attending provider between 7A-7P or the covering provider during after hours 7P-7A, please log into the web site [email or web address] and access using universal [place] password for that web site. If you do not have the password, please call the hospital operator.  11/06/2020, 3:30 PM

## 2020-11-06 NOTE — Progress Notes (Signed)
1 Day Post-Op   Subjective/Chief Complaint: Complains of pain. No flatus yet   Objective: Vital signs in last 24 hours: Temp:  [97.5 F (36.4 C)-99.1 F (37.3 C)] 98.6 F (37 C) (08/20 0933) Pulse Rate:  [58-79] 62 (08/20 0933) Resp:  [15-20] 18 (08/20 0933) BP: (120-171)/(62-96) 171/87 (08/20 0933) SpO2:  [96 %-99 %] 97 % (08/20 0933) Last BM Date: 11/04/20  Intake/Output from previous day: 08/19 0701 - 08/20 0700 In: 1495.9 [P.O.:240; I.V.:1107.5; IV Piggyback:148.4] Out: 755 [Urine:600; Drains:130; Blood:25] Intake/Output this shift: No intake/output data recorded.  General appearance: alert and cooperative Resp: clear to auscultation bilaterally Cardio: regular rate and rhythm GI: soft, moderate tenderness. Incisions ok  Lab Results:  Recent Labs    11/05/20 0428 11/06/20 0447  WBC 12.5* 18.0*  HGB 13.7 13.6  HCT 41.6 41.9  PLT 331 362   BMET Recent Labs    11/05/20 0428 11/06/20 0447  NA 140 139  K 4.8 4.6  CL 107 103  CO2 25 26  GLUCOSE 108* 105*  BUN 10 13  CREATININE 0.96 0.98  CALCIUM 9.7 9.4   PT/INR No results for input(s): LABPROT, INR in the last 72 hours. ABG No results for input(s): PHART, HCO3 in the last 72 hours.  Invalid input(s): PCO2, PO2  Studies/Results: DG ERCP BILIARY & PANCREATIC DUCTS  Result Date: 11/04/2020 CLINICAL DATA:  Choledocholithiasis Sphincterotomy and balloon sweep EXAM: ERCP TECHNIQUE: Multiple spot images obtained with the fluoroscopic device and submitted for interpretation post-procedure. FLUOROSCOPY TIME:  Fluoroscopy Time:  45 seconds Radiation Exposure Index (if provided by the fluoroscopic device): Not provided Number of Acquired Spot Images: 9 COMPARISON:  MRI/MRCP 11/02/2020 FINDINGS: Submitted intraoperative fluoroscopic images demonstrate cannulation and opacification of the intra and extrahepatic bile ducts. This is followed by a balloon sweep of the CBD. No filling defects identified within the common  bile duct on the final provided image. Evaluation of the mid duct is limited due to overlying scope. IMPRESSION: Intraoperative fluoroscopic images of ERCP as above. These images were submitted for radiologic interpretation only. Please see the procedural report for the amount of contrast and the fluoroscopy time utilized. Electronically Signed   By: Miachel Roux M.D.   On: 11/04/2020 13:27    Anti-infectives: Anti-infectives (From admission, onward)    Start     Dose/Rate Route Frequency Ordered Stop   11/02/20 1000  piperacillin-tazobactam (ZOSYN) IVPB 3.375 g        3.375 g 12.5 mL/hr over 240 Minutes Intravenous Every 8 hours 11/02/20 0541     11/02/20 0330  cefTRIAXone (ROCEPHIN) 2 g in sodium chloride 0.9 % 100 mL IVPB  Status:  Discontinued        2 g 200 mL/hr over 30 Minutes Intravenous  Once 11/02/20 0319 11/02/20 0322   11/02/20 0330  piperacillin-tazobactam (ZOSYN) IVPB 3.375 g        3.375 g 100 mL/hr over 30 Minutes Intravenous  Once 11/02/20 0322 11/02/20 0404       Assessment/Plan: s/p Procedure(s): LAPAROSCOPIC CHOLECYSTECTOMY SINGLE SITE (N/A) Postop ileus. Will continue clears until bowel function returns POD 1 ambulate  LOS: 4 days    Autumn Messing III 11/06/2020

## 2020-11-07 ENCOUNTER — Inpatient Hospital Stay (HOSPITAL_COMMUNITY): Payer: BC Managed Care – PPO

## 2020-11-07 LAB — CBC WITH DIFFERENTIAL/PLATELET
Abs Immature Granulocytes: 0.13 10*3/uL — ABNORMAL HIGH (ref 0.00–0.07)
Basophils Absolute: 0 10*3/uL (ref 0.0–0.1)
Basophils Relative: 0 %
Eosinophils Absolute: 0.2 10*3/uL (ref 0.0–0.5)
Eosinophils Relative: 2 %
HCT: 40.3 % (ref 39.0–52.0)
Hemoglobin: 13.3 g/dL (ref 13.0–17.0)
Immature Granulocytes: 1 %
Lymphocytes Relative: 18 %
Lymphs Abs: 2 10*3/uL (ref 0.7–4.0)
MCH: 29.4 pg (ref 26.0–34.0)
MCHC: 33 g/dL (ref 30.0–36.0)
MCV: 89 fL (ref 80.0–100.0)
Monocytes Absolute: 0.8 10*3/uL (ref 0.1–1.0)
Monocytes Relative: 7 %
Neutro Abs: 7.8 10*3/uL — ABNORMAL HIGH (ref 1.7–7.7)
Neutrophils Relative %: 72 %
Platelets: 327 10*3/uL (ref 150–400)
RBC: 4.53 MIL/uL (ref 4.22–5.81)
RDW: 13.7 % (ref 11.5–15.5)
WBC: 10.9 10*3/uL — ABNORMAL HIGH (ref 4.0–10.5)
nRBC: 0 % (ref 0.0–0.2)

## 2020-11-07 LAB — GLUCOSE, CAPILLARY
Glucose-Capillary: 101 mg/dL — ABNORMAL HIGH (ref 70–99)
Glucose-Capillary: 165 mg/dL — ABNORMAL HIGH (ref 70–99)
Glucose-Capillary: 103 mg/dL — ABNORMAL HIGH (ref 70–99)

## 2020-11-07 IMAGING — DX DG ABDOMEN 2V
3 series · 3 of 3 positions shown · non-contrast
Comparison: [DATE].

CLINICAL DATA: Status post cholecystectomy on [DATE]. Evaluate
for ileus.

EXAM:
ABDOMEN - 2 VIEW

[abdomen erect]
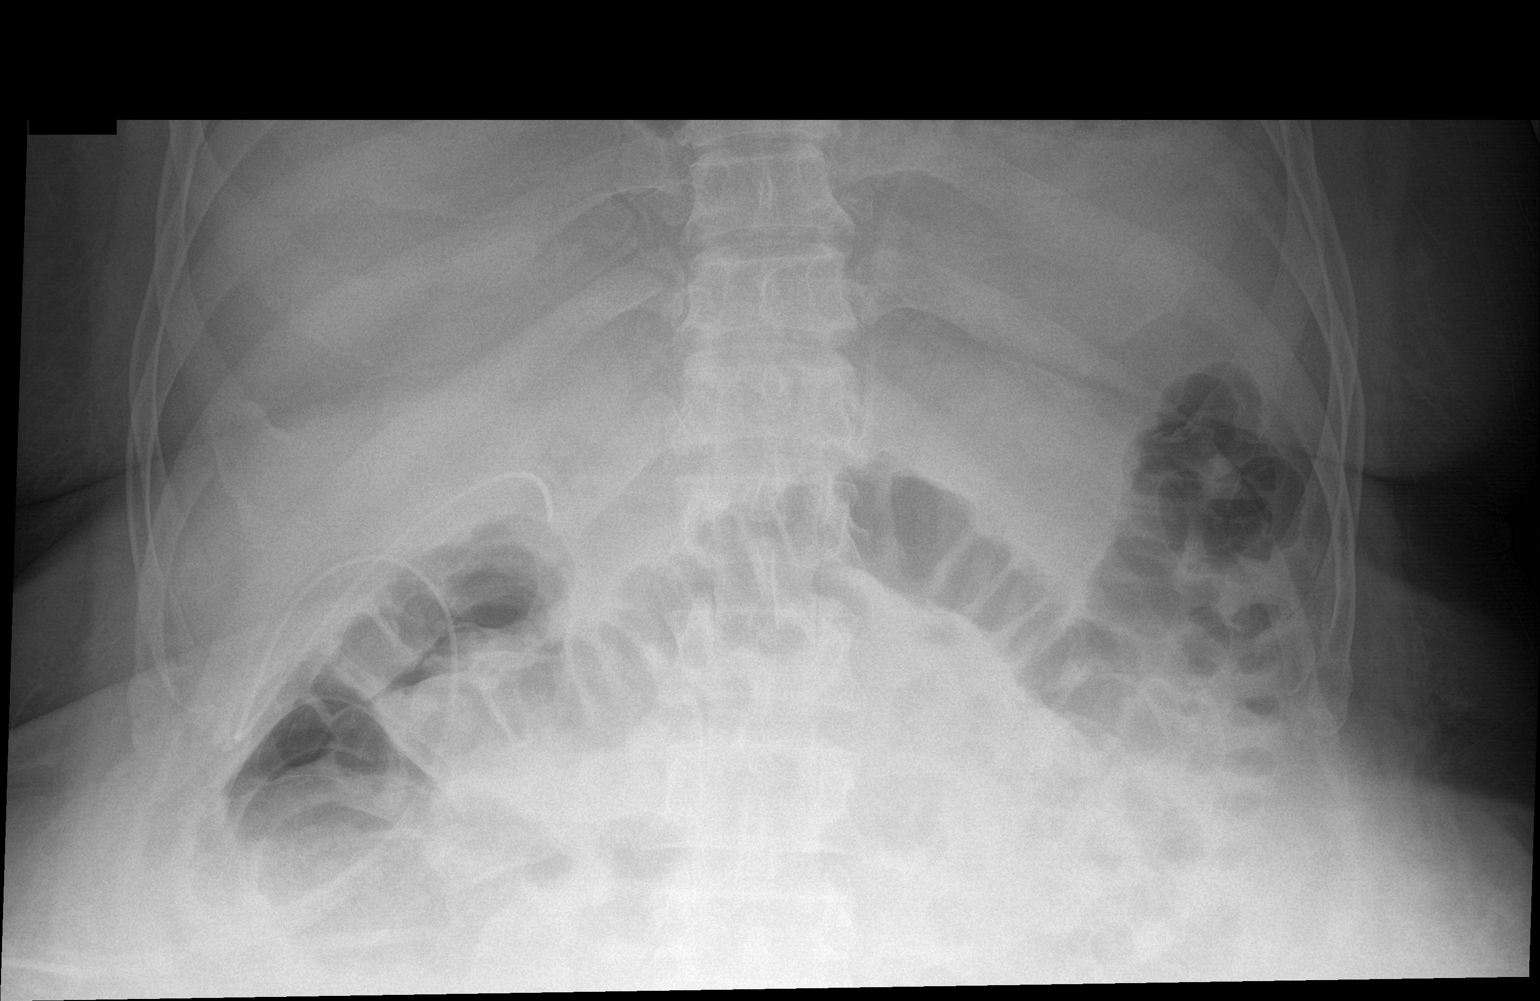

[abdomen supine (1 of 2)]
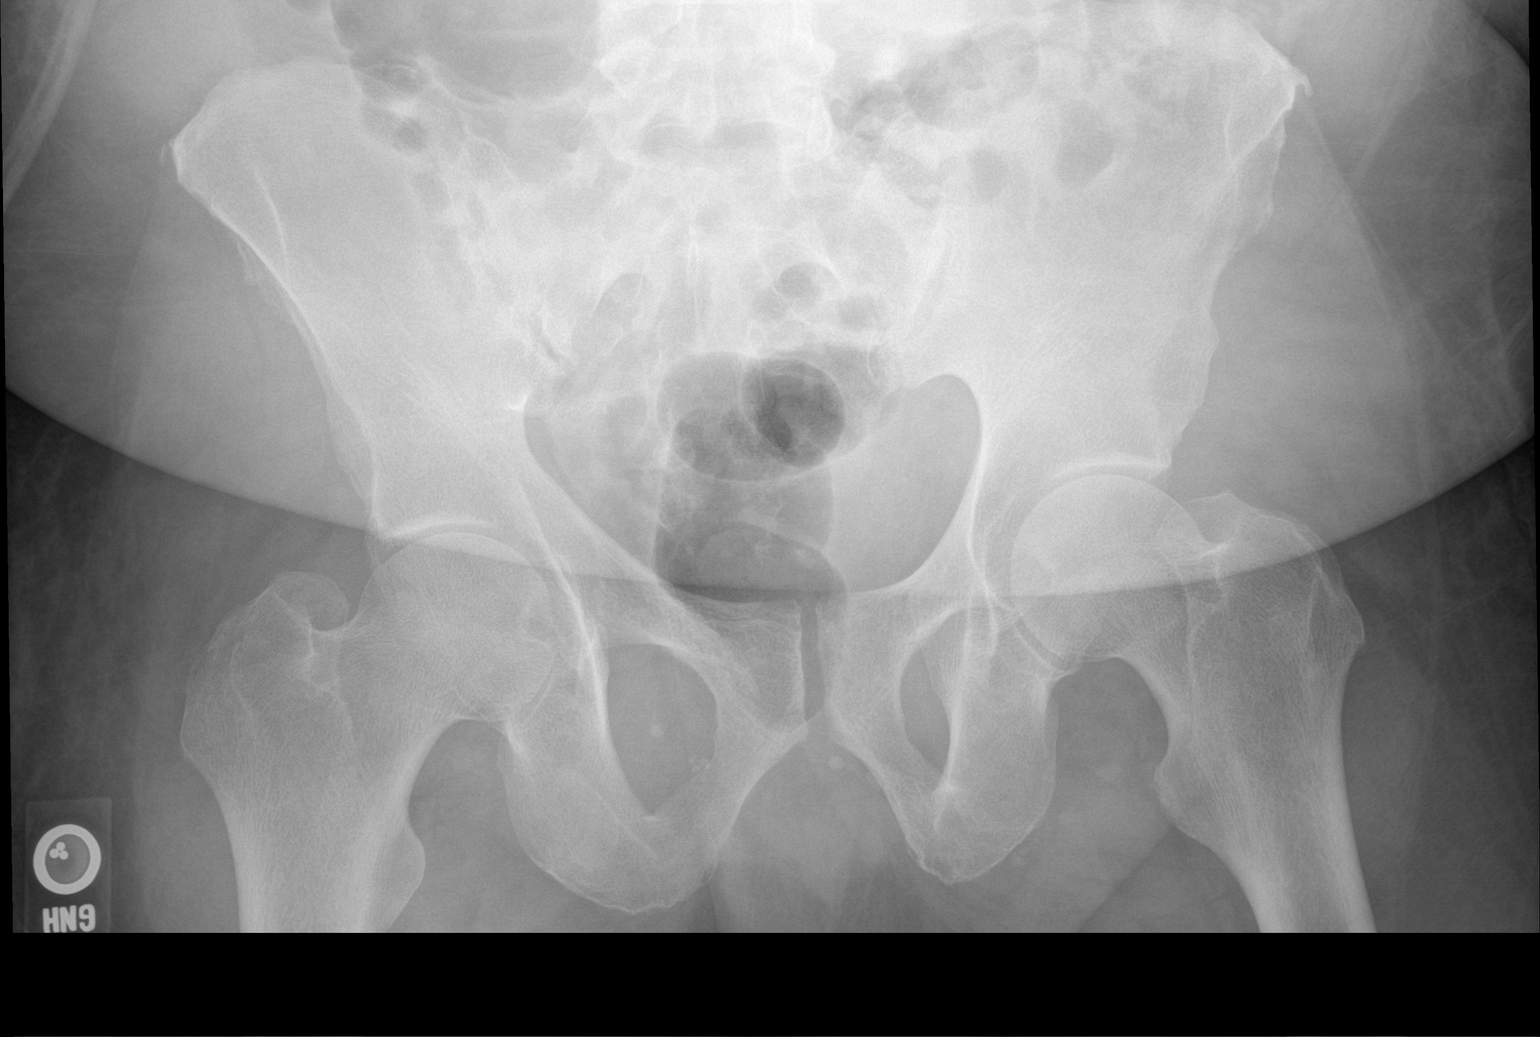

[abdomen supine (2 of 2)]
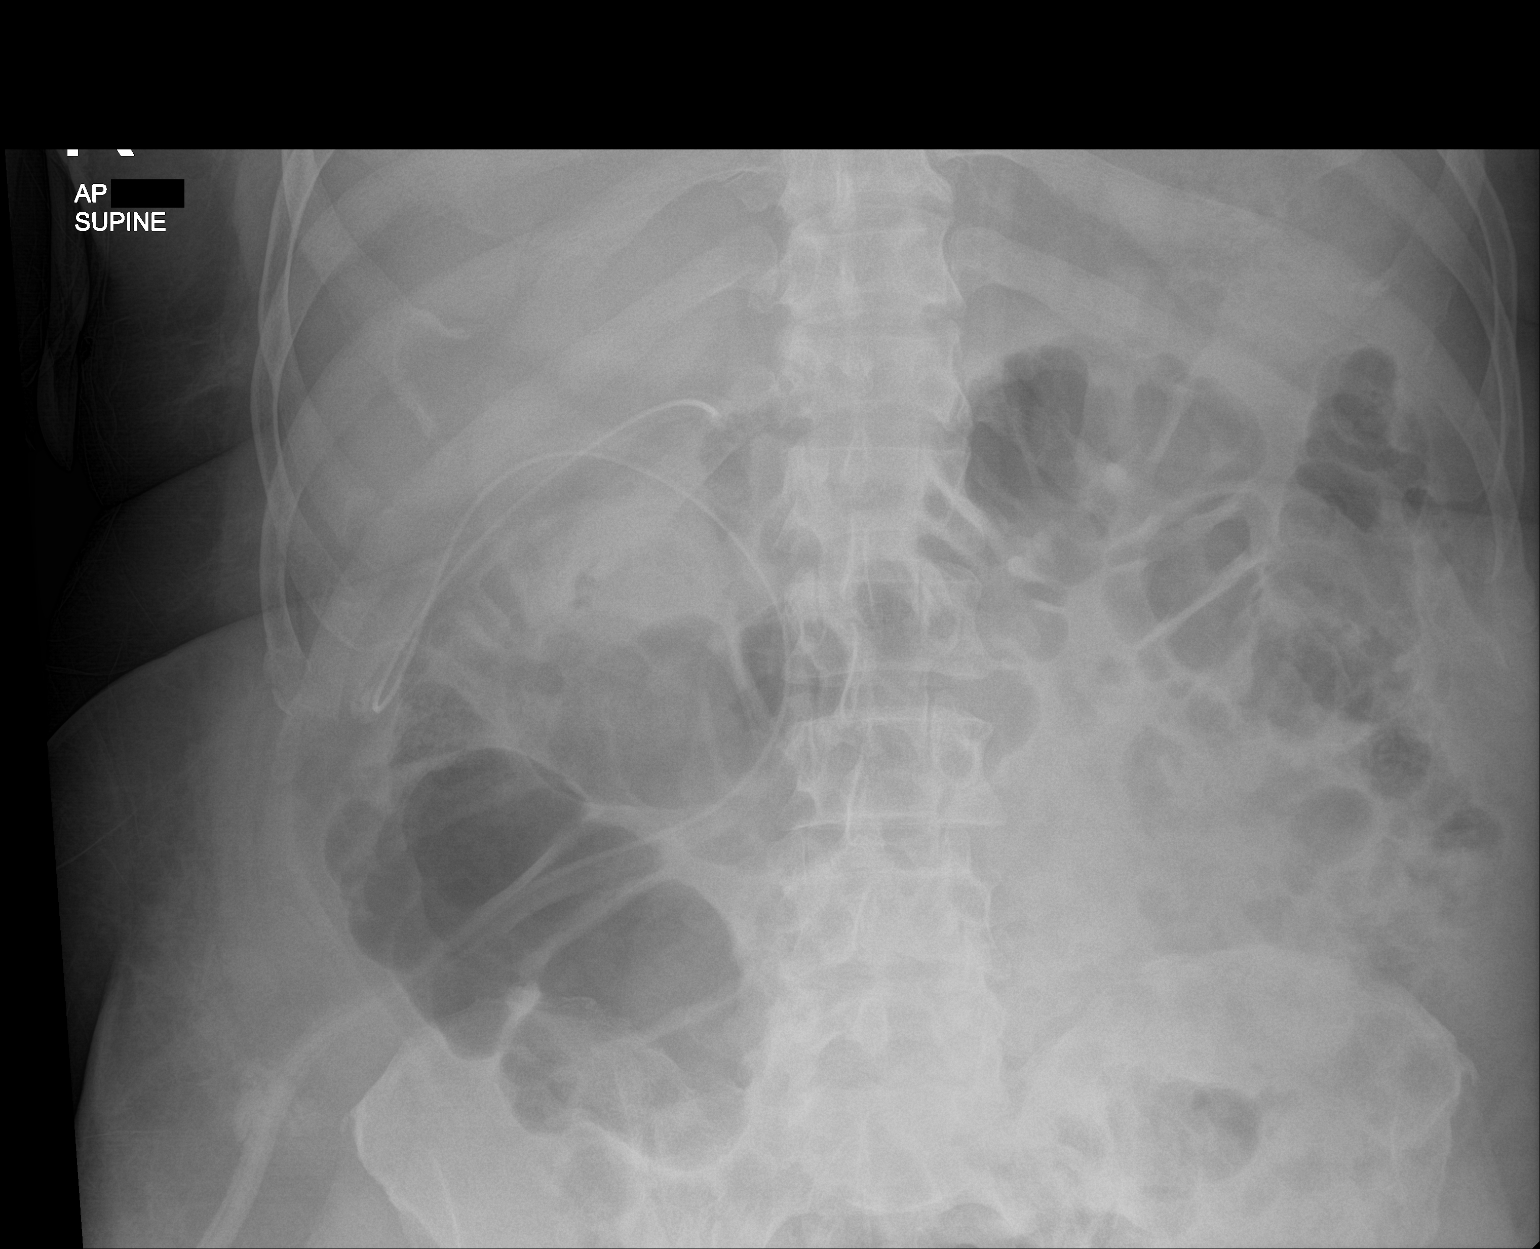

[3 of 3 positions shown; findings below may reference images not displayed]

FINDINGS: There is no bowel dilation to suggest obstruction or significant
adynamic ileus. No free air.

Surgical drainage catheter projects in the right upper quadrant.
Soft tissues are otherwise unremarkable.
IMPRESSION: 1. No evidence of bowel obstruction or significant adynamic ileus.
No free air.

## 2020-11-07 MED ORDER — AMOXICILLIN-POT CLAVULANATE 875-125 MG PO TABS
1.0000 | ORAL_TABLET | Freq: Two times a day (BID) | ORAL | Status: DC
  Administered 2020-11-07 – 2020-11-08 (×2): 1 via ORAL
  Filled 2020-11-07 (×2): qty 1

## 2020-11-07 NOTE — Progress Notes (Signed)
PROGRESS NOTE   Sean Valencia  [id number] DOB: 03-16-1961 DOA: 11/01/2020 PCP: Patient, No Pcp Per (Inactive)  Brief Narrative:  60 year old black male HTN not on meds, prior diverticulosis with small tubular adenomas Several months persistent abdominal pain right upper quadrant epigastric area Presented initially 8/14 lipase is 67 LFTs slightly elevated CT abdomen showed acute pancreatitis but patient had to go home-represented 8/16 Found to have acute pancreatitis GI consulted MRCP showed moderate pancreatitis borderline CBD dilatation  ERCP done on 8/18 cholecystectomy 8/19-has developed postop ileus-has a drain in place-watching white count-if continues to do well can probably discharge in 24 to 48 hours  Hospital-Problem based course  Acute gallstone pancreatitis with likely past CBD stone No complicating features ERCP 8/18 showed multiple stones lap chole performed 8/19 Has received 6 days of IV Zosyn will de-escalate to Augmentin until 10 days regular diet per general surgery pain control-transitioning from Dilaudid to Percocet ?  Postop ileus Watch for bowel function-abdominal x-ray negative for ileus HTN Poorly controlled amlodipine 10 mg   DVT prophylaxis: Lovenox Code Status: Full Family Communication: None Disposition:  Status is: Inpatient  Remains inpatient appropriate because:Hemodynamically unstable and Unsafe d/c plan  Dispo: The patient is from: Home              Anticipated d/c is to: Home              Patient currently is not medically stable to d/c.   Difficult to place patient No    Consultants:  General surgery GI  Procedures:   Antimicrobials:     Subjective:  overall much improved however eating and drinking No chest pain no fever no nausea no vomiting Still no stool but passing gas well  Objective: Vitals:   11/06/20 1333 11/06/20 2129 11/07/20 0542 11/07/20 1414  BP: (!) 178/82 (!) 163/85 (!) 173/80 (!) 158/89  Pulse: 67  63 67 79  Resp: '18 18 18 17  '$ Temp: 98.6 F (37 C) 98.7 F (37.1 C) 98.7 F (37.1 C) 99.1 F (37.3 C)  TempSrc: Oral Oral Oral Oral  SpO2: 100% 100% 100% 100%  Weight:      Height:        Intake/Output Summary (Last 24 hours) at 11/07/2020 1443 Last data filed at 11/07/2020 1400 Gross per 24 hour  Intake 1760 ml  Output 920 ml  Net 840 ml    Filed Weights   11/03/20 0500 11/04/20 1135 11/05/20 0622  Weight: 116.3 kg 116.3 kg 113.9 kg    Examination:  Pleasant coherent no distress Chest clear no rales rhonchi Abdomen less distended no rebound no guarding postop scars noted Moving all 4 limbs equally power 5/5  Data Reviewed: personally reviewed   CBC    Component Value Date/Time   WBC 10.9 (H) 11/07/2020 0359   RBC 4.53 11/07/2020 0359   HGB 13.3 11/07/2020 0359   HCT 40.3 11/07/2020 0359   PLT 327 11/07/2020 0359   MCV 89.0 11/07/2020 0359   MCV 88.5 06/14/2013 1113   MCH 29.4 11/07/2020 0359   MCHC 33.0 11/07/2020 0359   RDW 13.7 11/07/2020 0359   LYMPHSABS 2.0 11/07/2020 0359   MONOABS 0.8 11/07/2020 0359   EOSABS 0.2 11/07/2020 0359   BASOSABS 0.0 11/07/2020 0359   CMP Latest Ref Rng & Units 11/06/2020 11/05/2020 11/04/2020  Glucose 70 - 99 mg/dL 105(H) 108(H) 119(H)  BUN 6 - 20 mg/dL '13 10 6  '$ Creatinine 0.61 - 1.24 mg/dL 0.98 0.96 0.98  Sodium  135 - 145 mmol/L 139 140 140  Potassium 3.5 - 5.1 mmol/L 4.6 4.8 3.9  Chloride 98 - 111 mmol/L 103 107 107  CO2 22 - 32 mmol/L '26 25 26  '$ Calcium 8.9 - 10.3 mg/dL 9.4 9.7 9.2  Total Protein 6.5 - 8.1 g/dL 7.5 7.3 6.5  Total Bilirubin 0.3 - 1.2 mg/dL 1.5(H) 1.5(H) 1.7(H)  Alkaline Phos 38 - 126 U/L 135(H) 145(H) 140(H)  AST 15 - 41 U/L 80(H) 24 19  ALT 0 - 44 U/L 87(H) 46(H) 46(H)     Radiology Studies: DG Abd 2 Views  Result Date: 11/07/2020 CLINICAL DATA:  Status post cholecystectomy on 11/05/2020. Evaluate for ileus. EXAM: ABDOMEN - 2 VIEW COMPARISON:  06/14/2013. FINDINGS: There is no bowel dilation to  suggest obstruction or significant adynamic ileus. No free air. Surgical drainage catheter projects in the right upper quadrant. Soft tissues are otherwise unremarkable. IMPRESSION: 1. No evidence of bowel obstruction or significant adynamic ileus. No free air. Electronically Signed   By: Lajean Manes M.D.   On: 11/07/2020 12:12     Scheduled Meds:  acetaminophen  1,000 mg Oral Q6H   amLODipine  10 mg Oral Daily   lip balm  1 application Topical BID   polycarbophil  625 mg Oral BID   sodium chloride flush  3 mL Intravenous Q12H   Continuous Infusions:  sodium chloride Stopped (11/04/20 1112)   sodium chloride     methocarbamol (ROBAXIN) IV     piperacillin-tazobactam (ZOSYN)  IV 3.375 g (11/07/20 1047)     LOS: 5 days   Time spent: [place], MD [facility] To contact the attending provider between 7A-7P or the covering provider during after hours 7P-7A, please log into the web site [email or web address] and access using universal [place] password for that web site. If you do not have the password, please call the hospital operator.  11/07/2020, 2:43 PM

## 2020-11-07 NOTE — Progress Notes (Signed)
2 Days Post-Op   Subjective/Chief Complaint: Feeling a little better. Still hasn't had flatus or bm   Objective: Vital signs in last 24 hours: Temp:  [98.6 F (37 C)-98.7 F (37.1 C)] 98.7 F (37.1 C) (08/21 0542) Pulse Rate:  [62-67] 67 (08/21 0542) Resp:  [18] 18 (08/21 0542) BP: (163-178)/(80-87) 173/80 (08/21 0542) SpO2:  [97 %-100 %] 100 % (08/21 0542) Last BM Date: 11/04/20  Intake/Output from previous day: 08/20 0701 - 08/21 0700 In: 1420 [P.O.:1320; IV Piggyback:100] Out: 1170 [Urine:1100; Drains:70] Intake/Output this shift: Total I/O In: 0  Out: 400 [Urine:400]  General appearance: alert and cooperative Resp: clear to auscultation bilaterally Cardio: regular rate and rhythm GI: soft, moderate tenderness. Good bs. Drain output serosanguinous  Lab Results:  Recent Labs    11/06/20 0447 11/07/20 0359  WBC 18.0* 10.9*  HGB 13.6 13.3  HCT 41.9 40.3  PLT 362 327   BMET Recent Labs    11/05/20 0428 11/06/20 0447  NA 140 139  K 4.8 4.6  CL 107 103  CO2 25 26  GLUCOSE 108* 105*  BUN 10 13  CREATININE 0.96 0.98  CALCIUM 9.7 9.4   PT/INR No results for input(s): LABPROT, INR in the last 72 hours. ABG No results for input(s): PHART, HCO3 in the last 72 hours.  Invalid input(s): PCO2, PO2  Studies/Results: No results found.  Anti-infectives: Anti-infectives (From admission, onward)    Start     Dose/Rate Route Frequency Ordered Stop   11/02/20 1000  piperacillin-tazobactam (ZOSYN) IVPB 3.375 g        3.375 g 12.5 mL/hr over 240 Minutes Intravenous Every 8 hours 11/02/20 0541     11/02/20 0330  cefTRIAXone (ROCEPHIN) 2 g in sodium chloride 0.9 % 100 mL IVPB  Status:  Discontinued        2 g 200 mL/hr over 30 Minutes Intravenous  Once 11/02/20 0319 11/02/20 0322   11/02/20 0330  piperacillin-tazobactam (ZOSYN) IVPB 3.375 g        3.375 g 100 mL/hr over 30 Minutes Intravenous  Once 11/02/20 0322 11/02/20 0404       Assessment/Plan: s/p  Procedure(s): LAPAROSCOPIC CHOLECYSTECTOMY SINGLE SITE (N/A) Advance diet Ambulate Pod 2 Plan for discharge once bowel function returns  LOS: 5 days    Autumn Messing III 11/07/2020

## 2020-11-08 LAB — GLUCOSE, CAPILLARY: Glucose-Capillary: 109 mg/dL — ABNORMAL HIGH (ref 70–99)

## 2020-11-08 MED ORDER — AMLODIPINE BESYLATE 10 MG PO TABS: 10.0000 mg | ORAL_TABLET | Freq: Every day | ORAL | 0 refills | Status: AC

## 2020-11-08 MED ORDER — OXYCODONE-ACETAMINOPHEN 5-325 MG PO TABS: 1.0000 | ORAL_TABLET | ORAL | 0 refills | Status: DC | PRN

## 2020-11-08 NOTE — Progress Notes (Signed)
Patient was given discharge instructions, and all questions were answered.  Patient was stable for discharge and was taken to the main exit by wheelchair. 

## 2020-11-08 NOTE — Discharge Summary (Signed)
Physician Discharge Summary  Sean Valencia [id number] DOB: 1960/11/22 DOA: 11/01/2020  PCP: Patient, No Pcp Per (Inactive)  Admit date: 11/01/2020 Discharge date: 11/08/2020  Time spent: 26 minutes  Recommendations for Outpatient Follow-up:  Routine postop care as per general surgery in addition to drain management New prescription Percocet, amlodipine this admission  Discharge Diagnoses:  MAIN problem for hospitalization   Choledocholithiasis with likely pancreatitis status post multiple procedures this hospital stay improved  Please see below for itemized issues addressed in [place]- refer to other progress notes for clarity if needed  Discharge Condition: Improved  Diet recommendation: Fat restricted heart healthy  Filed Weights   11/04/20 1135 11/05/20 0622 11/08/20 0500  Weight: 116.3 kg 113.9 kg 115.3 kg    History of present illness:  60 year old black male HTN not on meds, prior diverticulosis with small tubular adenomas Several months persistent abdominal pain right upper quadrant epigastric area Presented initially 8/14 lipase is 67 LFTs slightly elevated CT abdomen showed acute pancreatitis but patient had to go home-represented 8/16 Found to have acute pancreatitis GI consulted MRCP showed moderate pancreatitis borderline CBD dilatation   ERCP done on 8/18 cholecystectomy 8/19-has developed postop ileus-has a drain in place-watching white count-if continues to do well can probably discharge in 24 to 74 hours  Hospital Course:  Acute gallstone pancreatitis with likely past CBD stone No complicating features ERCP 8/18 showed multiple stones lap chole performed 8/19 Drain will be managed in the outpatient setting by general surgery Has received 6 days of IV Zosyn --de-escalated to Augmentin and discontinued per surgery on discharge was using IV pain control-transitioning from Dilaudid to Percocet---> may only need Tylenol. ?  Postop ileus Eating and  drinking passing gas stool HTN Poorly controlled amlodipine 10 mg started this admission  Discharge Exam: Vitals:   11/07/20 2149 11/08/20 0524  BP: (!) 164/84 (!) 160/82  Pulse: 88 85  Resp: 18 18  Temp: 98.7 F (37.1 C) 98.9 F (37.2 C)  SpO2: 100% 98%    Subj on day of d/c   Awake coherent pleasant no distress eating drinking no complaints wonders about drain no other issues at this time  General Exam on discharge  Alert coherent pleasant EOMI NCAT no icterus no pallor CTA B no rales rhonchi Abdomen no rebound no guarding Drain in place ROM intact    Discharge Instructions   Discharge Instructions     Diet - low sodium heart healthy   Complete by: As directed    Discharge instructions   Complete by: As directed    Drain management postop instructions and activity postprocedure etc. as per general surgery-they will contact you for an outpatient follow-up appointment Limited prescription Percocet given can use Tylenol first choice and do not use Percocet if you do not have severe pain We have prescribed amlodipine for blood pressure control you will need follow-up in the outpatient setting with physician regarding the same   Increase activity slowly   Complete by: As directed    No wound care   Complete by: As directed       Allergies as of 11/08/2020   No Known Allergies      Medication List     TAKE these medications    amLODipine 10 MG tablet Commonly known as: NORVASC Take 1 tablet (10 mg total) by mouth daily. Start taking on: November 09, 2020   oxyCODONE-acetaminophen 5-325 MG tablet Commonly known as: PERCOCET/ROXICET Take 1 tablet by mouth every 4 (four) hours as  needed for moderate pain.       No Known Allergies  Follow-up Information     [facility], [facility] [place] Follow up on 11/23/2020.   Specialty: General Surgery Why: Follow-up on 11/23/20 at 10:25 AM.  Please arrive 30 minutes early to complete the check-in process and bring photo  ID and insurance card Contact information: [place] [address] [phone]                  The results of significant diagnostics from this hospitalization (including imaging, microbiology, ancillary and laboratory) are listed below for reference.    Significant Diagnostic Studies: CT Abdomen Pelvis W Contrast  Result Date: 10/31/2020 CLINICAL DATA:  Abdominal distension, abdominal pain. EXAM: CT ABDOMEN AND PELVIS WITH CONTRAST TECHNIQUE: Multidetector CT imaging of the abdomen and pelvis was performed using the standard protocol following bolus administration of intravenous contrast. CONTRAST:  64m OMNIPAQUE IOHEXOL 350 MG/ML SOLN COMPARISON:  None. FINDINGS: Lower chest: Mild bibasilar scarring/atelectasis. Hepatobiliary: No focal liver abnormality is seen. Gallbladder walls appear thickened but there is no pericholecystic fluid seen. Pancreas: Ill-defined fluid/edema about the pancreatic head/proximal body. Pancreatic tail appears normal. Spleen: Normal in size without focal abnormality. Adrenals/Urinary Tract: Adrenal glands appear normal. Kidneys are unremarkable without mass, stone or hydronephrosis. No ureteral or bladder calculi are identified. Bladder is unremarkable, decompressed. Stomach/Bowel: No dilated large or small bowel loops. Appendix is normal. Diverticulosis within the upper sigmoid colon but no focal inflammatory change to suggest acute diverticulitis. Probable reactive thickening of the walls of the duodenum, given the proximity to the peripancreatic inflammation. Stomach is unremarkable, partially decompressed. Vascular/Lymphatic: Mild aortic atherosclerosis. No acute-appearing vascular abnormality. No enlarged lymph nodes seen in the abdomen or pelvis. Reproductive: Prostate is unremarkable. Other: No active hemorrhage or abscess collection. No free intraperitoneal air. Musculoskeletal: No acute or suspicious osseous abnormality. Small RIGHT  inguinal hernia which contains fat only. IMPRESSION: 1. Findings are consistent with acute pancreatitis. No evidence of pancreatic necrosis. 2. Gallbladder walls appear thickened but there is no pericholecystic fluid seen, likely reactive to the adjacent pancreatitis. 3. Similarly, suspect reactive thickening of the walls of the duodenum given the adjacent peripancreatic inflammation. 4. Colonic diverticulosis without evidence of acute diverticulitis. 5. Small RIGHT inguinal hernia which contains fat only. Aortic Atherosclerosis (ICD10-I70.0). Electronically Signed   By: SFranki CabotM.D.   On: 10/31/2020 20:11   MR ABDOMEN MRCP WO CONTRAST  Result Date: 11/02/2020 CLINICAL DATA:  Worsening right upper quadrant and epigastric abdominal pain. Cholelithiasis. Acute pancreatitis. EXAM: MRI ABDOMEN WITHOUT CONTRAST  (INCLUDING MRCP) TECHNIQUE: Multiplanar multisequence MR imaging of the abdomen was performed. Heavily T2-weighted images of the biliary and pancreatic ducts were obtained, and three-dimensional MRCP images were rendered by post processing. COMPARISON:  CT on 10/31/2020 FINDINGS: Lower chest: No acute findings. Hepatobiliary: No masses visualized on this unenhanced exam. Multiple gallstones are seen, largest measuring 2.4 cm. No evidence of gallbladder wall thickening or pericholecystic inflammatory changes. Borderline dilatation of common bile duct is seen measuring up to 6 mm. A few tiny calculi are seen in the distal common bile duct on images 24 - 28/series 12. Pancreas: Moderate edema is seen involving the pancreatic head and body with adjacent peripancreatic inflammatory changes, consistent with acute pancreatitis. Minimal peripancreatic fluid is seen in the retroperitoneum, however there is no evidence of pseudocyst. Spleen:  Within normal limits in size. Adrenals/Urinary tract: Unremarkable. No evidence of nephrolithiasis or hydronephrosis. Stomach/Bowel: Visualized portion unremarkable.  Vascular/Lymphatic:  No pathologically enlarged lymph nodes identified. No evidence of abdominal aortic aneurysm. Other:  None. Musculoskeletal:  No suspicious bone lesions identified. IMPRESSION: Moderate acute pancreatitis. No evidence of pseudocyst. Borderline dilatation of common bile duct, with tiny calculi in the distal common bile duct. Cholelithiasis. No radiographic evidence of cholecystitis. Electronically Signed   By: Marlaine Hind M.D.   On: 11/02/2020 08:10   MR 3D Recon At Scanner  Result Date: 11/02/2020 CLINICAL DATA:  Worsening right upper quadrant and epigastric abdominal pain. Cholelithiasis. Acute pancreatitis. EXAM: MRI ABDOMEN WITHOUT CONTRAST  (INCLUDING MRCP) TECHNIQUE: Multiplanar multisequence MR imaging of the abdomen was performed. Heavily T2-weighted images of the biliary and pancreatic ducts were obtained, and three-dimensional MRCP images were rendered by post processing. COMPARISON:  CT on 10/31/2020 FINDINGS: Lower chest: No acute findings. Hepatobiliary: No masses visualized on this unenhanced exam. Multiple gallstones are seen, largest measuring 2.4 cm. No evidence of gallbladder wall thickening or pericholecystic inflammatory changes. Borderline dilatation of common bile duct is seen measuring up to 6 mm. A few tiny calculi are seen in the distal common bile duct on images 24 - 28/series 12. Pancreas: Moderate edema is seen involving the pancreatic head and body with adjacent peripancreatic inflammatory changes, consistent with acute pancreatitis. Minimal peripancreatic fluid is seen in the retroperitoneum, however there is no evidence of pseudocyst. Spleen:  Within normal limits in size. Adrenals/Urinary tract: Unremarkable. No evidence of nephrolithiasis or hydronephrosis. Stomach/Bowel: Visualized portion unremarkable. Vascular/Lymphatic: No pathologically enlarged lymph nodes identified. No evidence of abdominal aortic aneurysm. Other:  None. Musculoskeletal:  No suspicious  bone lesions identified. IMPRESSION: Moderate acute pancreatitis. No evidence of pseudocyst. Borderline dilatation of common bile duct, with tiny calculi in the distal common bile duct. Cholelithiasis. No radiographic evidence of cholecystitis. Electronically Signed   By: Marlaine Hind M.D.   On: 11/02/2020 08:10   DG ERCP BILIARY & PANCREATIC DUCTS  Result Date: 11/04/2020 CLINICAL DATA:  Choledocholithiasis Sphincterotomy and balloon sweep EXAM: ERCP TECHNIQUE: Multiple spot images obtained with the fluoroscopic device and submitted for interpretation post-procedure. FLUOROSCOPY TIME:  Fluoroscopy Time:  45 seconds Radiation Exposure Index (if provided by the fluoroscopic device): Not provided Number of Acquired Spot Images: 9 COMPARISON:  MRI/MRCP 11/02/2020 FINDINGS: Submitted intraoperative fluoroscopic images demonstrate cannulation and opacification of the intra and extrahepatic bile ducts. This is followed by a balloon sweep of the CBD. No filling defects identified within the common bile duct on the final provided image. Evaluation of the mid duct is limited due to overlying scope. IMPRESSION: Intraoperative fluoroscopic images of ERCP as above. These images were submitted for radiologic interpretation only. Please see the procedural report for the amount of contrast and the fluoroscopy time utilized. Electronically Signed   By: Miachel Roux M.D.   On: 11/04/2020 13:27   DG Abd 2 Views  Result Date: 11/07/2020 CLINICAL DATA:  Status post cholecystectomy on 11/05/2020. Evaluate for ileus. EXAM: ABDOMEN - 2 VIEW COMPARISON:  06/14/2013. FINDINGS: There is no bowel dilation to suggest obstruction or significant adynamic ileus. No free air. Surgical drainage catheter projects in the right upper quadrant. Soft tissues are otherwise unremarkable. IMPRESSION: 1. No evidence of bowel obstruction or significant adynamic ileus. No free air. Electronically Signed   By: Lajean Manes M.D.   On: 11/07/2020 12:12    US Abdomen Limited RUQ (LIVER/GB)  Result Date: 11/02/2020 CLINICAL DATA:  Thickened gallbladder walls on recent CT examination EXAM: ULTRASOUND ABDOMEN LIMITED RIGHT UPPER QUADRANT COMPARISON:  CT from 10/31/2020  FINDINGS: Gallbladder: Gallbladder is well distended with evidence of cholelithiasis. Negative sonographic Murphy's sign is elicited. Mild wall thickening is noted likely related to incomplete distension. Common bile duct: Diameter: 3.7 mm. Liver: No focal lesion identified. Within normal limits in parenchymal echogenicity. Portal vein is patent on color Doppler imaging with normal direction of blood flow towards the liver. Other: None. IMPRESSION: Cholelithiasis without definitive complicating factors. Negative sonographic Murphy's sign was elicited. Electronically Signed   By: Inez Catalina M.D.   On: 11/02/2020 01:34   US Abdomen Limited RUQ (LIVER/GB)  Result Date: 10/31/2020 CLINICAL DATA:  Right upper quadrant pain for 5 days. EXAM: ULTRASOUND ABDOMEN LIMITED RIGHT UPPER QUADRANT COMPARISON:  None. FINDINGS: Gallbladder: Physiologically distended. Shadowing intraluminal gallstone measuring 2.3 cm. Gallbladder wall thickness is upper normal at 2 to 3 mm. No pericholecystic fluid. No sonographic Murphy sign noted by sonographer. Common bile duct: Diameter: 5 mm, normal Liver: No focal lesion identified. Within normal limits in parenchymal echogenicity. Portal vein is patent on color Doppler imaging with normal direction of blood flow towards the liver. Other: No right upper quadrant ascites. IMPRESSION: 1. Gallstone without sonographic findings of acute cholecystitis. 2. No biliary dilatation. Electronically Signed   By: Keith Rake M.D.   On: 10/31/2020 19:57    Microbiology: Recent Results (from the past 240 hour(s))  Urine Culture     Status: None   Collection Time: 10/31/20  5:32 PM   Specimen: Urine, Clean Catch  Result Value Ref Range Status   Specimen Description   Final     URINE, CLEAN CATCH Performed at [facility], [place] [address] [place], [place] [address]    Special Requests   Final    NONE Performed at [facility], [place] [address] [place], [place] [address]    Culture   Final    NO GROWTH Performed at [facility], [place] [address] [place], [place] [address]    Report Status 11/01/2020 FINAL  Final  Resp Panel by RT-PCR (Flu A&B, Covid) Nasopharyngeal Swab     Status: None   Collection Time: 11/02/20  5:10 AM   Specimen: Nasopharyngeal Swab; Nasopharyngeal(NP) swabs in vial transport medium  Result Value Ref Range Status   SARS Coronavirus 2 by RT PCR NEGATIVE NEGATIVE Final    Comment: (NOTE) SARS-CoV-2 target nucleic acids are NOT DETECTED.  The SARS-CoV-2 RNA is generally detectable in upper respiratory specimens during the acute phase of infection. The lowest concentration of SARS-CoV-2 viral copies this assay can detect is 138 copies/mL. A negative result does not preclude SARS-Cov-2 infection and should not be used as the sole basis for treatment or other patient management decisions. A negative result may occur with  improper specimen collection/handling, submission of specimen other than nasopharyngeal swab, presence of viral mutation(s) within the areas targeted by this assay, and inadequate number of viral copies(<138 copies/mL). A negative result must be combined with clinical observations, patient history, and epidemiological information. The expected result is Negative.  Fact Sheet for Patients:  EntrepreneurPulse.com.au  Fact Sheet for Healthcare Providers:  IncredibleEmployment.be  This test is no t yet approved or cleared by the [place] FDA and  has been authorized for detection and/or diagnosis of SARS-CoV-2 by FDA under an Emergency Use Authorization (EUA). This EUA will remain  in effect (meaning this test can be used) for the  duration of the COVID-19 declaration under Section 564(b)(1) of the Act, 21 U.S.C.section 360bbb-3(b)(1), unless the authorization is  terminated  or revoked sooner.       Influenza A by PCR NEGATIVE NEGATIVE Final   Influenza B by PCR NEGATIVE NEGATIVE Final    Comment: (NOTE) The Xpert Xpress SARS-CoV-2/FLU/RSV plus assay is intended as an aid in the diagnosis of influenza from Nasopharyngeal swab specimens and should not be used as a sole basis for treatment. Nasal washings and aspirates are unacceptable for Xpert Xpress SARS-CoV-2/FLU/RSV testing.  Fact Sheet for Patients: EntrepreneurPulse.com.au  Fact Sheet for Healthcare Providers: IncredibleEmployment.be  This test is not yet approved or cleared by the [place] FDA and has been authorized for detection and/or diagnosis of SARS-CoV-2 by FDA under an Emergency Use Authorization (EUA). This EUA will remain in effect (meaning this test can be used) for the duration of the COVID-19 declaration under Section 564(b)(1) of the Act, 21 U.S.C. section 360bbb-3(b)(1), unless the authorization is terminated or revoked.  Performed at [facility], [place] [address] [place], [place] [address]      Labs: Basic Metabolic Panel: Recent Labs  Lab 11/02/20 0541 11/03/20 0428 11/04/20 0539 11/05/20 0428 11/06/20 0447  NA 141 140 140 140 139  K 3.7 3.8 3.9 4.8 4.6  CL 106 105 107 107 103  CO2 '26 27 26 25 26  '$ GLUCOSE 111* 117* 119* 108* 105*  BUN '10 8 6 10 13  '$ CREATININE 0.97 0.87 0.98 0.96 0.98  CALCIUM 9.6 9.0 9.2 9.7 9.4   Liver Function Tests: Recent Labs  Lab 11/02/20 0541 11/03/20 0428 11/04/20 0539 11/05/20 0428 11/06/20 0447  AST '29 20 19 24 '$ 80*  ALT 90* 60* 46* 46* 87*  ALKPHOS 178* 151* 140* 145* 135*  BILITOT 2.5* 1.9* 1.7* 1.5* 1.5*  PROT 7.1 6.6 6.5 7.3 7.5  ALBUMIN 3.3* 2.7* 2.8* 3.1* 3.2*   Recent Labs  Lab 11/01/20 1952 11/03/20 0428  11/04/20 0539 11/05/20 0428  LIPASE 34 30 33 25   No results for input(s): AMMONIA in the last 168 hours. CBC: Recent Labs  Lab 11/02/20 0541 11/03/20 0428 11/04/20 0539 11/05/20 0428 11/06/20 0447 11/07/20 0359  WBC 17.4* 11.9* 9.8 12.5* 18.0* 10.9*  NEUTROABS 13.6*  --  6.7 10.1* 15.8* 7.8*  HGB 13.3 12.7* 12.8* 13.7 13.6 13.3  HCT 39.7 38.4* 38.4* 41.6 41.9 40.3  MCV 87.1 86.9 87.3 88.1 89.0 89.0  PLT 208 247 289 331 362 327   Cardiac Enzymes: No results for input(s): CKTOTAL, CKMB, CKMBINDEX, TROPONINI in the last 168 hours. BNP: BNP (last 3 results) No results for input(s): BNP in the last 8760 hours.  ProBNP (last 3 results) No results for input(s): PROBNP in the last 8760 hours.  CBG: Recent Labs  Lab 11/06/20 2322 11/07/20 0755 11/07/20 1618 11/07/20 2327 11/08/20 0718  GLUCAP 108* 101* 165* 103* 109*       Signed:  Nita Sells MD   [facility] 11/08/2020, 11:54 AM

## 2020-11-08 NOTE — Progress Notes (Signed)
3 Days Post-Op   Subjective/Chief Complaint: Feeling well this morning.  Ready to go home.  Tolerating diet without difficulty.   Objective: Vital signs in last 24 hours: Temp:  [98.7 F (37.1 C)-99.1 F (37.3 C)] 98.9 F (37.2 C) (08/22 0524) Pulse Rate:  [79-88] 85 (08/22 0524) Resp:  [17-18] 18 (08/22 0524) BP: (158-164)/(82-89) 160/82 (08/22 0524) SpO2:  [98 %-100 %] 98 % (08/22 0524) Weight:  [115.3 kg] 115.3 kg (08/22 0500) Last BM Date: 11/07/20  Intake/Output from previous day: 08/21 0701 - 08/22 0700 In: 2170 [P.O.:2170] Out: 1550 [Urine:1500; Drains:50] Intake/Output this shift: No intake/output data recorded.  General appearance: alert and cooperative Resp: Unlabored Cardio: regular rate and rhythm GI: soft, nondistended, minimal tenderness is appropriate. Good bs. Drain output serosanguinous  Lab Results:  Recent Labs    11/06/20 0447 11/07/20 0359  WBC 18.0* 10.9*  HGB 13.6 13.3  HCT 41.9 40.3  PLT 362 327    BMET Recent Labs    11/06/20 0447  NA 139  K 4.6  CL 103  CO2 26  GLUCOSE 105*  BUN 13  CREATININE 0.98  CALCIUM 9.4    PT/INR No results for input(s): LABPROT, INR in the last 72 hours. ABG No results for input(s): PHART, HCO3 in the last 72 hours.  Invalid input(s): PCO2, PO2  Studies/Results: DG Abd 2 Views  Result Date: 11/07/2020 CLINICAL DATA:  Status post cholecystectomy on 11/05/2020. Evaluate for ileus. EXAM: ABDOMEN - 2 VIEW COMPARISON:  06/14/2013. FINDINGS: There is no bowel dilation to suggest obstruction or significant adynamic ileus. No free air. Surgical drainage catheter projects in the right upper quadrant. Soft tissues are otherwise unremarkable. IMPRESSION: 1. No evidence of bowel obstruction or significant adynamic ileus. No free air. Electronically Signed   By: Lajean Manes M.D.   On: 11/07/2020 12:12    Anti-infectives: Anti-infectives (From admission, onward)    Start     Dose/Rate Route Frequency  Ordered Stop   11/07/20 2200  amoxicillin-clavulanate (AUGMENTIN) 875-125 MG per tablet 1 tablet        1 tablet Oral Every 12 hours 11/07/20 1447     11/02/20 1000  piperacillin-tazobactam (ZOSYN) IVPB 3.375 g  Status:  Discontinued        3.375 g 12.5 mL/hr over 240 Minutes Intravenous Every 8 hours 11/02/20 0541 11/07/20 1447   11/02/20 0330  cefTRIAXone (ROCEPHIN) 2 g in sodium chloride 0.9 % 100 mL IVPB  Status:  Discontinued        2 g 200 mL/hr over 30 Minutes Intravenous  Once 11/02/20 0319 11/02/20 0322   11/02/20 0330  piperacillin-tazobactam (ZOSYN) IVPB 3.375 g        3.375 g 100 mL/hr over 30 Minutes Intravenous  Once 11/02/20 0322 11/02/20 0404       Assessment/Plan: s/p Procedure(s): LAPAROSCOPIC CHOLECYSTECTOMY SINGLE SITE (N/A)  Okay for discharge home from surgery standpoint. Pain rx ordered, follow up in chart   LOS: 6 days    Clovis Riley 11/08/2020

## 2020-11-08 NOTE — Progress Notes (Signed)
Taught patient how to care for JP drain before discharge. All questions were answered.

## 2020-11-09 LAB — SURGICAL PATHOLOGY

## 2020-11-26 ENCOUNTER — Other Ambulatory Visit: Payer: Self-pay | Admitting: Student

## 2020-11-26 ENCOUNTER — Other Ambulatory Visit: Payer: Self-pay

## 2020-11-26 ENCOUNTER — Ambulatory Visit (HOSPITAL_BASED_OUTPATIENT_CLINIC_OR_DEPARTMENT_OTHER)
Admission: RE | Admit: 2020-11-26 | Discharge: 2020-11-26 | Disposition: A | Discharge status: Home / Self Care | Payer: BC Managed Care – PPO | Source: Ambulatory Visit | Attending: Student | Admitting: Student

## 2020-11-26 DIAGNOSIS — R109 Unspecified abdominal pain: Secondary | ICD-10-CM | POA: Insufficient documentation

## 2020-11-26 IMAGING — CT CT ABD-PELV W/ CM
2 of 5 series · 16 of 46 positions shown, 18 images · IV contrast (APPLIED)
Comparison: CT abdomen and pelvis dated [DATE]

CLINICAL DATA: Status post cholecystectomy.  Drain check.

EXAM:
CT ABDOMEN AND PELVIS WITH CONTRAST
TECHNIQUE: Multidetector CT imaging of the abdomen and pelvis was performed
using the standard protocol following bolus administration of
intravenous contrast.
CONTRAST:  75mL OMNIPAQUE IOHEXOL 350 MG/ML SOLN

[Series 2: abd pel w · axial · 0.85mm/px · z∈[+922,+1342]mm · 13 of 96 slices shown, 15 images]
[im 6/96  soft-tissue]
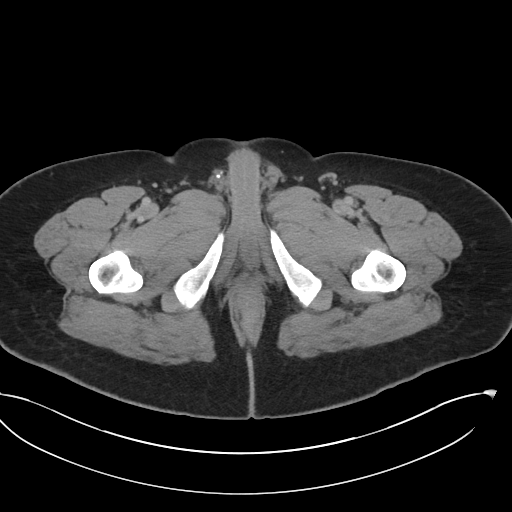
[im 6/96  bone]
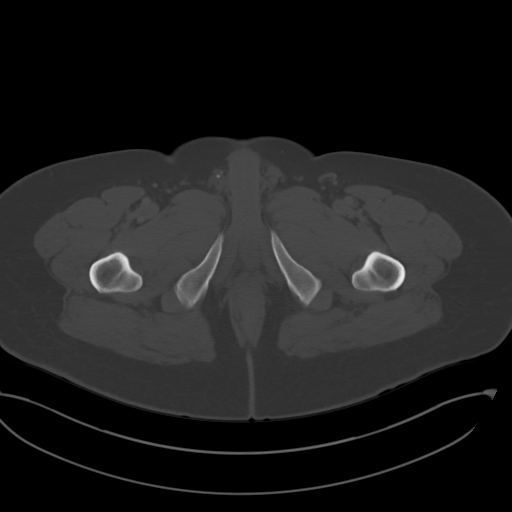
[im 11/96  soft-tissue]
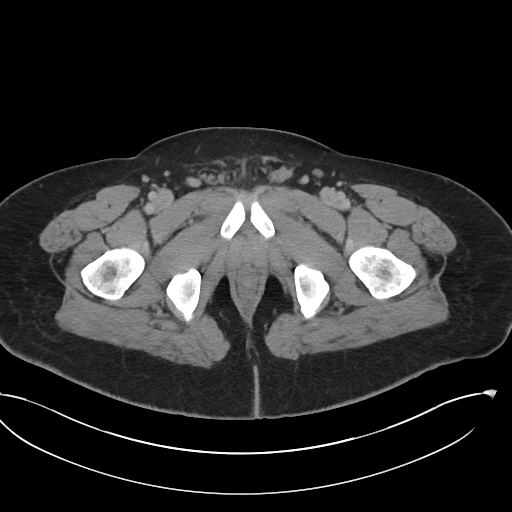
[im 22/96  soft-tissue]
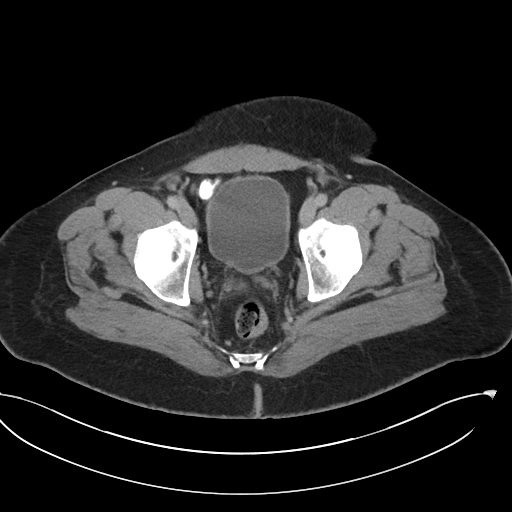
[im 27/96  soft-tissue]
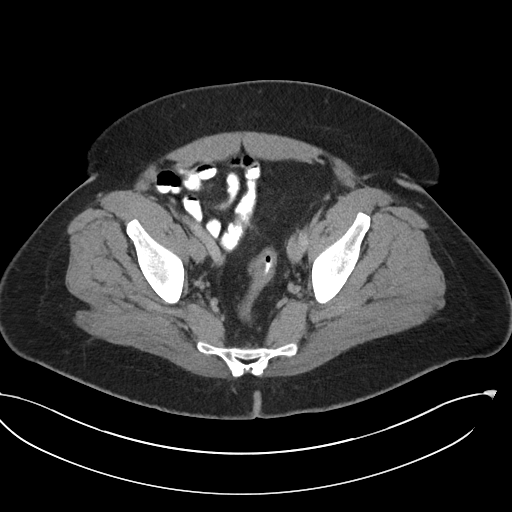
[im 32/96  soft-tissue]
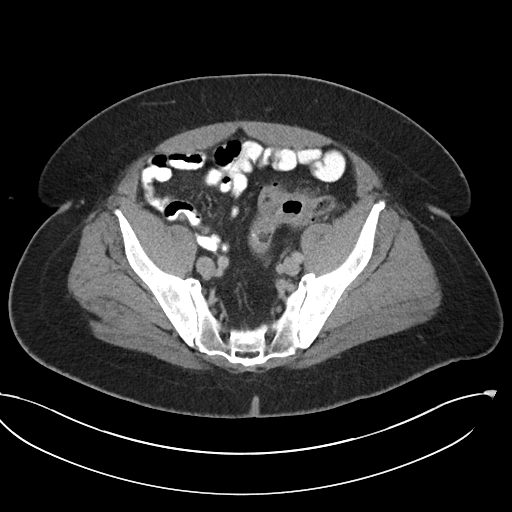
[im 43/96  soft-tissue]
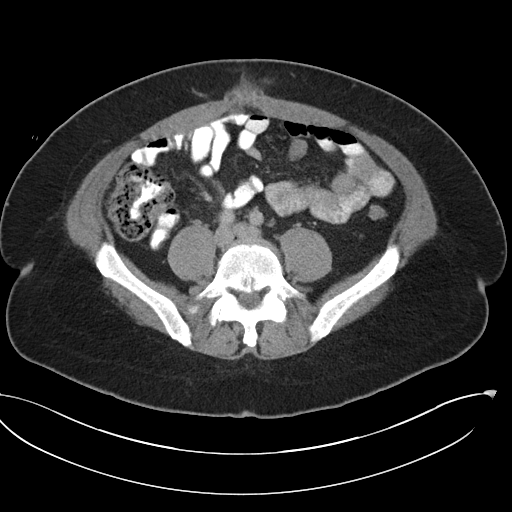
[im 48/96  soft-tissue]
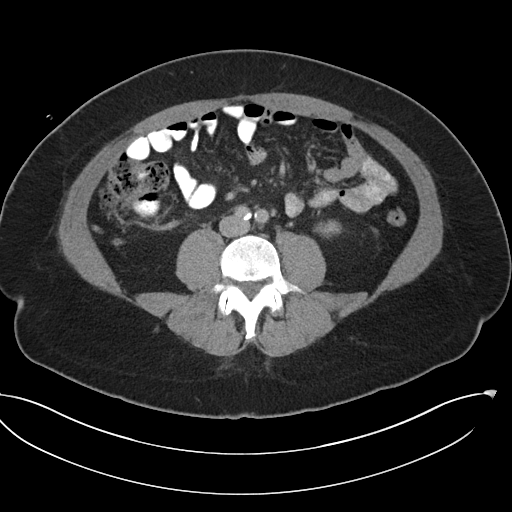
[im 53/96  soft-tissue]
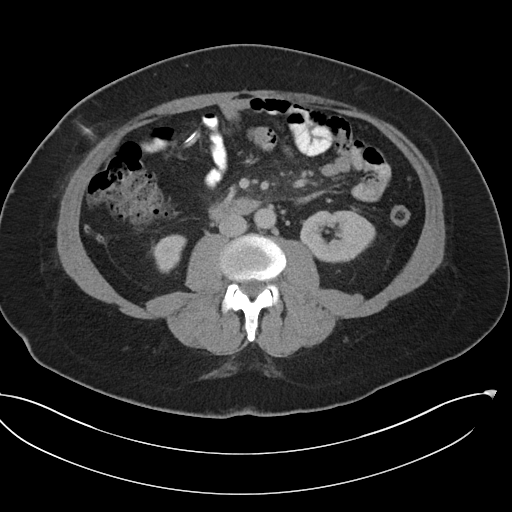
[im 64/96  soft-tissue]
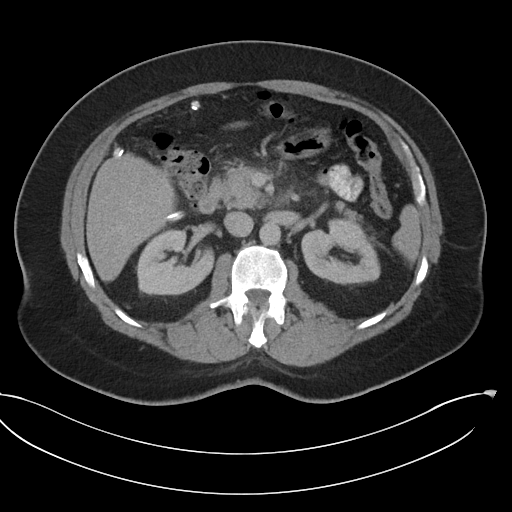
[im 64/96  bone]
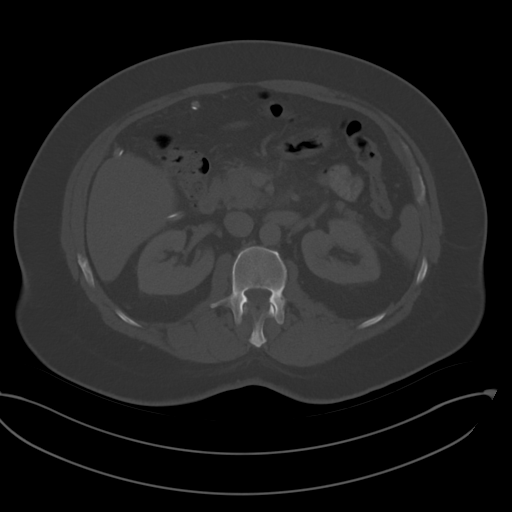
[im 69/96  soft-tissue]
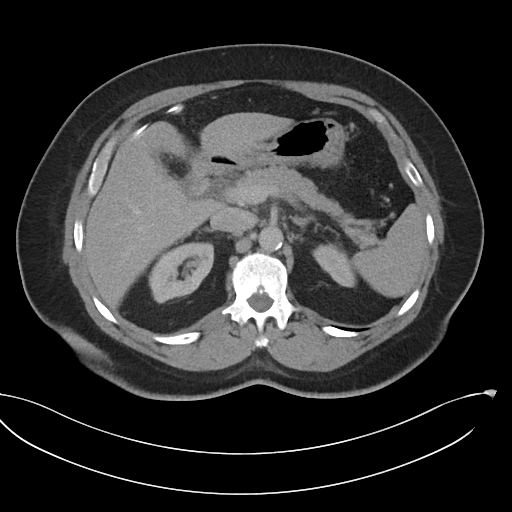
[im 74/96  soft-tissue]
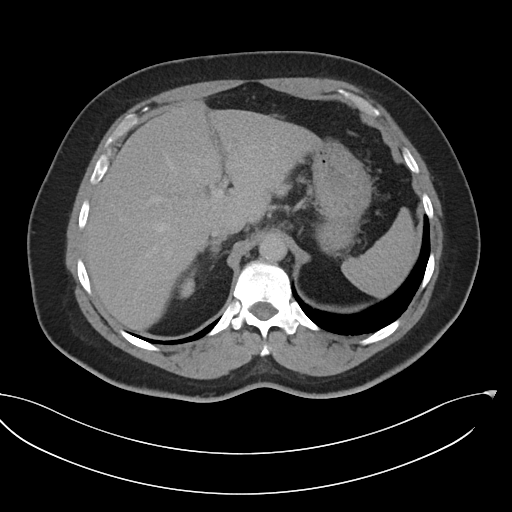
[im 85/96  soft-tissue]
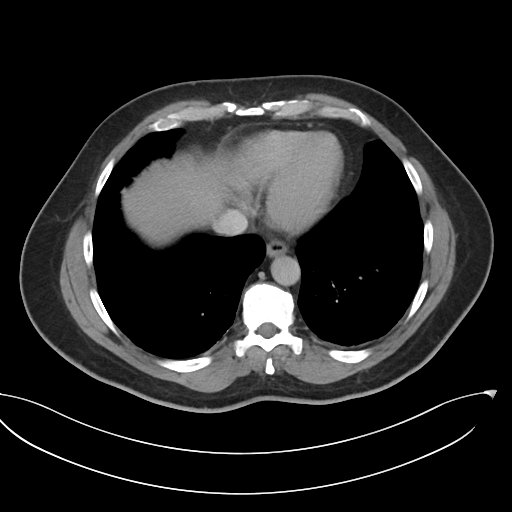
[im 90/96  soft-tissue]
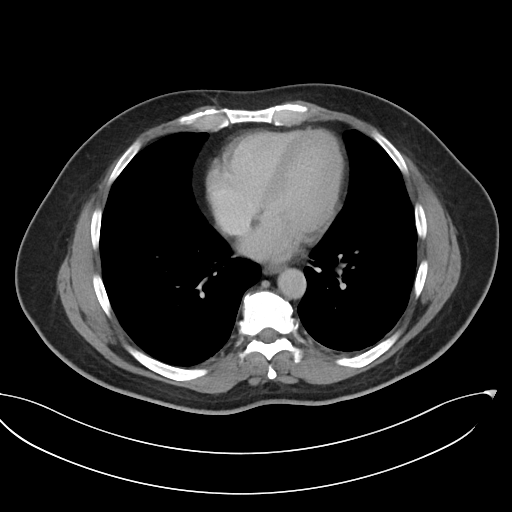

[Series 5: coronal · coronal · 0.95mm/px · 3 of 118 slices shown]
[im 40/118  soft-tissue]
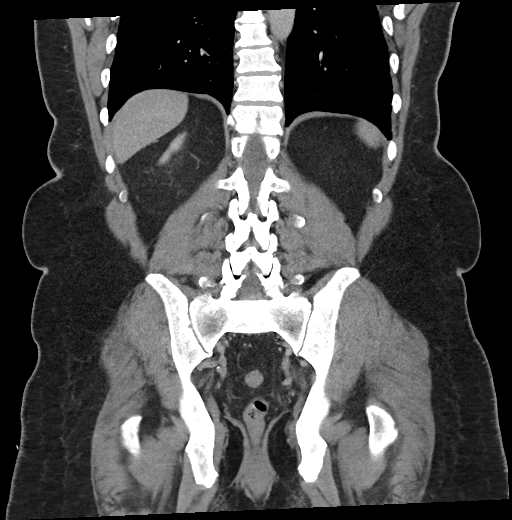
[im 53/118  soft-tissue]
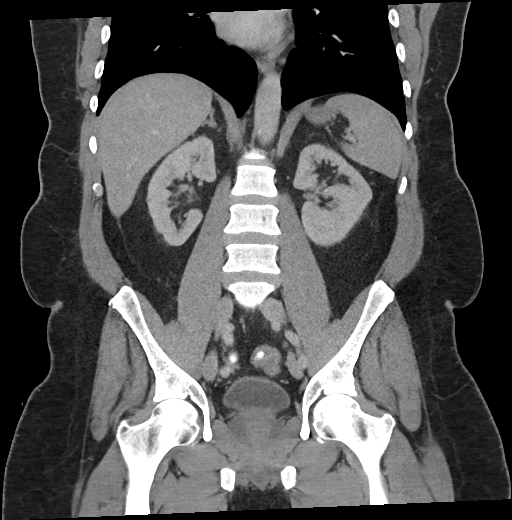
[im 66/118  soft-tissue]
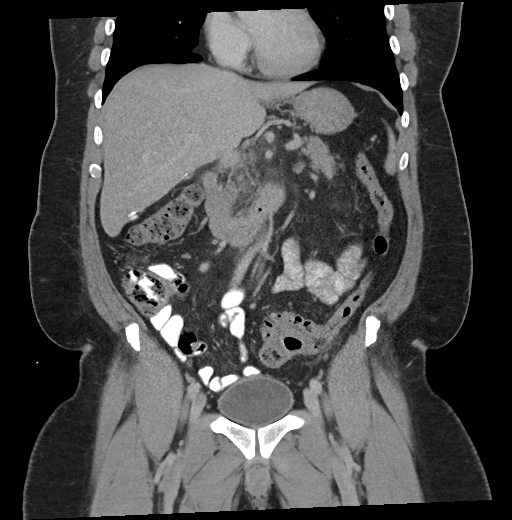

[16 of 46 positions shown; findings below may reference images not displayed]

FINDINGS: Lower chest: Numerous small nodules of the bilateral lower lobes.

Hepatobiliary: No focal liver lesions. Postsurgical findings of
interval cholecystectomy. Right upper quadrant drain place with tip
positioned under the right lobe of the liver. No fluid collections.
No biliary ductal dilation.

Pancreas: Fat stranding about the head of the pancreas, decreased
compared to prior exam.

Spleen: Normal in size without focal abnormality.

Adrenals/Urinary Tract: Adrenal glands are unremarkable. Kidneys are
normal, without renal calculi, focal lesion, or hydronephrosis.
Bladder is unremarkable.

Stomach/Bowel: Stomach is within normal limits. Appendix appears
normal. Sigmoid diverticulosis. Unchanged mild wall thickening of
the sigmoid colon, likely due to prior episodes of diverticulitis.
Incidental note is made of small duodenal diverticula. No evidence
of obstruction or inflammatory changes.

Vascular/Lymphatic: Aortic atherosclerosis. No enlarged abdominal or
pelvic lymph nodes.

Reproductive: Prostate is unremarkable.

Other: Small fat containing right inguinal hernia. No abdominopelvic
ascites.

Musculoskeletal: No acute or significant osseous findings.
IMPRESSION: Postsurgical changes of interval cholecystectomy with right upper
quadrant drain in place. No intra-abdominal fluid collections.

Decreased fat stranding about the head of the pancreas, compatible
with resolving pancreatitis.

Numerous small nodules of the bilateral lower lobes, likely due to
infection or aspiration.

Aortic Atherosclerosis ([91]-[91]).

## 2020-11-26 MED ORDER — IOHEXOL 350 MG/ML SOLN
100.0000 mL | Freq: Once | INTRAVENOUS | Status: AC | PRN
  Administered 2020-11-26: 75 mL via INTRAVENOUS

## 2021-09-20 ENCOUNTER — Ambulatory Visit (HOSPITAL_COMMUNITY)
Admission: EM | Admit: 2021-09-20 | Discharge: 2021-09-20 | Disposition: A | Discharge status: Home / Self Care | Payer: BC Managed Care – PPO | Attending: Internal Medicine | Admitting: Internal Medicine

## 2021-09-20 ENCOUNTER — Encounter (HOSPITAL_COMMUNITY): Payer: Self-pay

## 2021-09-20 DIAGNOSIS — H1032 Unspecified acute conjunctivitis, left eye: Secondary | ICD-10-CM

## 2021-09-20 MED ORDER — GENTAMICIN SULFATE 0.3 % OP SOLN: 2.0000 [drp] | OPHTHALMIC | 0 refills | Status: DC

## 2021-09-20 NOTE — ED Triage Notes (Signed)
Onset Saturday of eye redness, light sensitivity, and irritation in the left eye. Some drainage as well.   No known sick exposure. No one at home with symptoms.

## 2021-09-20 NOTE — Discharge Instructions (Addendum)
You have bacterial pinkeye.  Apply 2 drops of gentamicin eyedrops to both of your eyes every 4 hours for the next 7 days.  You may also use cool compresses to your eyes prior to applying the drops to decrease swelling around your eyes and provide comfort.  Wash your hands frequently to prevent spread of pinkeye.  Change your pillowcase after 2 to 3 days to prevent reinfection.  If you develop any new or worsening symptoms or do not improve in the next 2 to 3 days, please return.  If your symptoms are severe, please go to the emergency room.  Follow-up with your primary care provider for further evaluation and management of your symptoms as well as ongoing wellness visits.  I hope you feel better!

## 2021-09-20 NOTE — ED Provider Notes (Signed)
[facility]    CSN: [id number] Arrival date & time: 09/20/21  1119      History   Chief Complaint Chief Complaint  Patient presents with   Eye Pain    HPI Sean Valencia is a 61 y.o. adult.   Patient presents to urgent care for evaluation of left eye redness that started on Saturday, September 17, 2021 (3 days ago).  Patient has been applying Pataday eyedrops and attempt to decrease the itching and redness without success.  Denies foreign body/trauma to the left eye that he is aware of.  No blurry vision or visual acuity abnormalities.  Patient denies headache, eye pain, ear pain, sore throat, dizziness, nasal congestion, and neck pain. Denies sick contacts with similar symptoms. Denies contact lens use. Has not attempted use of warm compresses for symptoms. Denies pain to left eye, reports mild itch. No crusting upon waking reported. Drainage from left eye is "whiteish/clear".    Eye Pain   Past Medical History:  Diagnosis Date   Allergy    Anal fistula    Arthritis    History of kidney stones    HTN (hypertension)     Patient Active Problem List   Diagnosis Date Noted   Acute pancreatitis 11/02/2020   Elevated blood pressure reading 11/02/2020   Gallstones 11/02/2020   Gallstone pancreatitis 11/02/2020    Past Surgical History:  Procedure Laterality Date   CHOLECYSTECTOMY N/A 11/05/2020   Procedure: LAPAROSCOPIC CHOLECYSTECTOMY SINGLE SITE;  Surgeon: Michael Boston, MD;  Location: [facility];  Service: General;  Laterality: N/A;   ENDOSCOPIC RETROGRADE CHOLANGIOPANCREATOGRAPHY (ERCP) WITH PROPOFOL N/A 11/04/2020   Procedure: ENDOSCOPIC RETROGRADE CHOLANGIOPANCREATOGRAPHY (ERCP) WITH PROPOFOL;  Surgeon: Jackquline Denmark, MD;  Location: [facility];  Service: Endoscopy;  Laterality: N/A;   INGUINAL HERNIA REPAIR Bilateral 1992   REMOVAL OF STONES  11/04/2020   Procedure: REMOVAL OF STONES;  Surgeon: Jackquline Denmark, MD;  Location: [facility];  Service: Endoscopy;;    SPHINCTEROTOMY  11/04/2020   Procedure: Joan Mayans;  Surgeon: Jackquline Denmark, MD;  Location: [facility];  Service: Endoscopy;;    OB History   No obstetric history on file.      Home Medications    Prior to Admission medications   Medication Sig Start Date End Date Taking? Authorizing Provider  amLODipine (NORVASC) 10 MG tablet Take 1 tablet (10 mg total) by mouth daily. 11/09/20  Yes Nita Sells, MD  gentamicin (GARAMYCIN) 0.3 % ophthalmic solution Place 2 drops into both eyes every 4 (four) hours. 09/20/21  Yes Talbot Grumbling, FNP  oxyCODONE-acetaminophen (PERCOCET/ROXICET) 5-325 MG tablet Take 1 tablet by mouth every 4 (four) hours as needed for moderate pain. 11/08/20   Clovis Riley, MD    Family History Family History  Problem Relation Age of Onset   Diabetes Father    Hypertension Father    Bone cancer Maternal Grandfather    Diabetes Paternal Grandmother    Diabetes Paternal Grandfather    Leukemia Paternal Grandfather    Hypercalcemia Brother    Colon cancer Neg Hx    Esophageal cancer Neg Hx    Stomach cancer Neg Hx    Rectal cancer Neg Hx     Social History Social History   Tobacco Use   Smoking status: Never   Smokeless tobacco: Never  Vaping Use   Vaping Use: Never used  Substance Use Topics   Alcohol use: Yes    Comment: 4 per year   Drug use: No  Allergies   Patient has no known allergies.   Review of Systems Review of Systems  Eyes:  Positive for pain.   Per HPI  Physical Exam Triage Vital Signs ED Triage Vitals  Enc Vitals Group     BP 09/20/21 1149 137/81     Pulse Rate 09/20/21 1149 81     Resp 09/20/21 1149 16     Temp 09/20/21 1149 98.9 F (37.2 C)     Temp Source 09/20/21 1149 Oral     SpO2 09/20/21 1149 96 %     Weight 09/20/21 1150 250 lb (113.4 kg)     Height 09/20/21 1150 '5\' 11"'$  (1.803 m)     Head Circumference --      Peak Flow --      Pain Score 09/20/21 1150 2     Pain Loc --      Pain  Edu? --      Excl. in [place]? --    No data found.  Updated Vital Signs BP 137/81 (BP Location: Left Arm)   Pulse 81   Temp 98.9 F (37.2 C) (Oral)   Resp 16   Ht '5\' 11"'$  (1.803 m)   Wt 250 lb (113.4 kg)   SpO2 96%   BMI 34.87 kg/m   Visual Acuity Right Eye Distance:   Left Eye Distance:   Bilateral Distance:    Right Eye Near:   Left Eye Near:    Bilateral Near:     Physical Exam Vitals and nursing note reviewed.  Constitutional:      Appearance: Normal appearance. He is not ill-appearing or toxic-appearing.     Comments: Very pleasant patient sitting on exam in position of comfort table in no acute distress.   HENT:     Head: Normocephalic and atraumatic.     Right Ear: Hearing and external ear normal.     Left Ear: Hearing and external ear normal.     Nose: Nose normal.     Mouth/Throat:     Lips: Pink.     Mouth: Mucous membranes are moist.  Eyes:     General: Lids are normal. Vision grossly intact. Gaze aligned appropriately. No visual field deficit.    Extraocular Movements: Extraocular movements intact.     Conjunctiva/sclera:     Left eye: Left conjunctiva is injected. Exudate present.  Pulmonary:     Effort: Pulmonary effort is normal.  Abdominal:     Palpations: Abdomen is soft.  Musculoskeletal:     Cervical back: Neck supple.  Skin:    General: Skin is warm and dry.     Capillary Refill: Capillary refill takes less than 2 seconds.     Findings: No rash.  Neurological:     General: No focal deficit present.     Mental Status: He is alert and oriented to person, place, and time. Mental status is at baseline.     Cranial Nerves: No dysarthria or facial asymmetry.     Gait: Gait is intact.  Psychiatric:        Mood and Affect: Mood normal.        Speech: Speech normal.        Behavior: Behavior normal.        Thought Content: Thought content normal.        Judgment: Judgment normal.      UC Treatments / Results  Labs (all labs ordered are  listed, but only abnormal results are displayed) Labs Reviewed - No  data to display  EKG   Radiology No results found.  Procedures Procedures (including critical care time)  Medications Ordered in UC Medications - No data to display  Initial Impression / Assessment and Plan / UC Course  I have reviewed the triage vital signs and the nursing notes.  Pertinent labs & imaging results that were available during my care of the patient were reviewed by me and considered in my medical decision making (see chart for details).  Acute bacterial conjunctivitis of left eye Gentamicin eyedrops 2 drops into both eyes every 4 hours for the next 7 days to be used 2 treat bacterial conjunctivitis.  Advised increase handwashing and patient to change his pillowcase after 2 to 3 days to prevent reinfection.  Patient may use cool compresses to the eyes prior to applying eyedrops to decrease swelling, itch, exudate, and pain.  He may take Tylenol 1000 mg every 6 hours as needed over-the-counter for pain.  Visual acuity is grossly intact.  No clinical indication for further evaluation or advanced imaging in the emergency department at this time.  Neurologic physical exam is stable.  Discussed physical exam and available lab work findings in clinic with patient.  Counseled patient regarding appropriate use of medications and potential side effects for all medications recommended or prescribed today. Discussed red flag signs and symptoms of worsening condition,when to call the PCP office, return to urgent care, and when to seek higher level of care in the emergency department. Patient verbalizes understanding and agreement with plan. All questions answered. Patient discharged in stable condition.  Final Clinical Impressions(s) / UC Diagnoses   Final diagnoses:  Acute bacterial conjunctivitis of left eye     Discharge Instructions      You have bacterial pinkeye.  Apply 2 drops of gentamicin eyedrops to  both of your eyes every 4 hours for the next 7 days.  You may also use cool compresses to your eyes prior to applying the drops to decrease swelling around your eyes and provide comfort.  Wash your hands frequently to prevent spread of pinkeye.  Change your pillowcase after 2 to 3 days to prevent reinfection.  If you develop any new or worsening symptoms or do not improve in the next 2 to 3 days, please return.  If your symptoms are severe, please go to the emergency room.  Follow-up with your primary care provider for further evaluation and management of your symptoms as well as ongoing wellness visits.  I hope you feel better!     ED Prescriptions     Medication Sig Dispense Auth. Provider   gentamicin (GARAMYCIN) 0.3 % ophthalmic solution Place 2 drops into both eyes every 4 (four) hours. 5 mL Talbot Grumbling, FNP      PDMP not reviewed this encounter.   Talbot Grumbling, [place] 09/20/21 1253

## 2022-01-09 ENCOUNTER — Ambulatory Visit
Admission: EM | Admit: 2022-01-09 | Discharge: 2022-01-09 | Disposition: A | Discharge status: Home / Self Care | Payer: BC Managed Care – PPO

## 2022-01-09 DIAGNOSIS — J0100 Acute maxillary sinusitis, unspecified: Secondary | ICD-10-CM | POA: Insufficient documentation

## 2022-01-09 DIAGNOSIS — B309 Viral conjunctivitis, unspecified: Secondary | ICD-10-CM | POA: Diagnosis not present

## 2022-01-09 DIAGNOSIS — K5909 Other constipation: Secondary | ICD-10-CM | POA: Insufficient documentation

## 2022-01-09 DIAGNOSIS — K611 Rectal abscess: Secondary | ICD-10-CM | POA: Insufficient documentation

## 2022-01-09 DIAGNOSIS — N390 Urinary tract infection, site not specified: Secondary | ICD-10-CM | POA: Insufficient documentation

## 2022-01-09 MED ORDER — OLOPATADINE HCL 0.1 % OP SOLN: 1.0000 [drp] | Freq: Two times a day (BID) | OPHTHALMIC | 0 refills | Status: DC

## 2022-01-09 MED ORDER — OLOPATADINE HCL 0.1 % OP SOLN: 1.0000 [drp] | Freq: Two times a day (BID) | OPHTHALMIC | 12 refills | Status: DC

## 2022-01-09 NOTE — Discharge Instructions (Addendum)
Amended use of OTC TheraTears as moisturizer for your left eye.  This may relieve some of the itchy symptoms.  You may also wish to try the prescribed antihistamine eyedrops.  Follow up here or with your primary care provider if your symptoms are worsening or not improving.

## 2022-01-09 NOTE — ED Provider Notes (Signed)
[name]    CSN: [id number] Arrival date & time: 01/09/22  1022      History   Chief Complaint Chief Complaint  Patient presents with   Eye Problem    HPI Sean Valencia is a 61 y.o. adult.    Eye Problem   Dents to UC with complaint of left eye redness and discomfort since Friday.  He states this is a recurrent issue and using eyedrops to treat himself.  Patient endorses only redness and watery left eye.  Denies a.m. crusting or production of purulent discharge.  Past Medical History:  Diagnosis Date   Allergy    Anal fistula    Arthritis    History of kidney stones    HTN (hypertension)     Patient Active Problem List   Diagnosis Date Noted   Acute maxillary sinusitis 01/09/2022   Chronic constipation 01/09/2022   Perirectal abscess 01/09/2022   Urinary tract infectious disease 01/09/2022   Acute pancreatitis 11/02/2020   Elevated blood pressure reading 11/02/2020   Gallstones 11/02/2020   Gallstone pancreatitis 11/02/2020    Past Surgical History:  Procedure Laterality Date   CHOLECYSTECTOMY N/A 11/05/2020   Procedure: LAPAROSCOPIC CHOLECYSTECTOMY SINGLE SITE;  Surgeon: Michael Boston, MD;  Location: [facility];  Service: General;  Laterality: N/A;   ENDOSCOPIC RETROGRADE CHOLANGIOPANCREATOGRAPHY (ERCP) WITH PROPOFOL N/A 11/04/2020   Procedure: ENDOSCOPIC RETROGRADE CHOLANGIOPANCREATOGRAPHY (ERCP) WITH PROPOFOL;  Surgeon: Jackquline Denmark, MD;  Location: [facility];  Service: Endoscopy;  Laterality: N/A;   INGUINAL HERNIA REPAIR Bilateral 1992   REMOVAL OF STONES  11/04/2020   Procedure: REMOVAL OF STONES;  Surgeon: Jackquline Denmark, MD;  Location: [facility];  Service: Endoscopy;;   SPHINCTEROTOMY  11/04/2020   Procedure: Joan Mayans;  Surgeon: Jackquline Denmark, MD;  [facility]: [facility];  Service: Endoscopy;;    OB History   No obstetric history on file.      Home Medications    Prior to Admission medications   Medication Sig Start  Date End Date Taking? Authorizing Provider  amLODipine (NORVASC) 10 MG tablet Take 1 tablet by mouth daily. 08/18/21  Yes [provider]  amoxicillin-clavulanate (AUGMENTIN) 875-125 MG tablet Take 1 tablet by mouth every 12 (twelve) hours. 03/21/20  Yes [provider]  acetaminophen (TYLENOL) 500 MG tablet Take by mouth.    [provider]  amLODipine (NORVASC) 10 MG tablet Take 1 tablet (10 mg total) by mouth daily. 11/09/20   Nita Sells, MD  azithromycin (ZITHROMAX) 250 MG tablet Take by mouth. 11/05/21   [provider]  gentamicin (GARAMYCIN) 0.3 % ophthalmic solution Place 2 drops into both eyes every 4 (four) hours. 09/20/21   Talbot Grumbling, FNP  LINZESS 145 MCG CAPS capsule Take 145 mcg by mouth daily. 11/02/21   [provider]  oxyCODONE-acetaminophen (PERCOCET/ROXICET) 5-325 MG tablet Take 1 tablet by mouth every 4 (four) hours as needed for moderate pain. 11/08/20   Clovis Riley, MD  tobramycin (TOBREX) 0.3 % ophthalmic solution SMARTSIG:In Eye(s) 11/05/21   [provider]    Family History Family History  Problem Relation Age of Onset   Diabetes Father    Hypertension Father    Bone cancer Maternal Grandfather    Diabetes Paternal Grandmother    Diabetes Paternal Grandfather    Leukemia Paternal Grandfather    Hypercalcemia Brother    Colon cancer Neg Hx    Esophageal cancer Neg Hx    Stomach cancer Neg Hx    Rectal  cancer Neg Hx     Social History Social History   Tobacco Use   Smoking status: Never   Smokeless tobacco: Never  Vaping Use   Vaping Use: Never used  Substance Use Topics   Alcohol use: Yes    Comment: 4 per year   Drug use: No     Allergies   Patient has no known allergies.   Review of Systems Review of Systems   Physical Exam Triage Vital Signs ED Triage Vitals  Enc Vitals Group     BP 01/09/22 1033 (!) 145/87     Pulse Rate 01/09/22 1033 74     Resp 01/09/22 1033  14     Temp 01/09/22 1033 97.8 F (36.6 C)     Temp src --      SpO2 01/09/22 1033 98 %     Weight --      Height --      Head Circumference --      Peak Flow --      Pain Score 01/09/22 1034 3     Pain Loc --      Pain Edu? --      Excl. in [place]? --    No data found.  Updated Vital Signs BP (!) 145/87   Pulse 74   Temp 97.8 F (36.6 C)   Resp 14   SpO2 98%   Visual Acuity Right Eye Distance:   Left Eye Distance:   Bilateral Distance:    Right Eye Near:   Left Eye Near:    Bilateral Near:     Physical Exam Vitals reviewed.  Constitutional:      Appearance: Normal appearance.  Eyes:     General:        Left eye: Discharge present.    Conjunctiva/sclera:     Left eye: Left conjunctiva is injected. No exudate.    Comments: Clear watery discharge from left eye.  Skin:    General: Skin is warm and dry.  Neurological:     General: No focal deficit present.     Mental Status: He is alert and oriented to person, place, and time.  Psychiatric:        Mood and Affect: Mood normal.        Behavior: Behavior normal.      UC Treatments / Results  Labs (all labs ordered are listed, but only abnormal results are displayed) Labs Reviewed - No data to display  EKG   Radiology No results found.  Procedures Procedures (including critical care time)  Medications Ordered in UC Medications - No data to display  Initial Impression / Assessment and Plan / UC Course  I have reviewed the triage vital signs and the nursing notes.  Pertinent labs & imaging results that were available during my care of the patient were reviewed by me and considered in my medical decision making (see chart for details).   Likely viral conjunctivitis of the left eye.  Warned patient to avoid touching his eye and then the other to avoid spreading.  Encouraged him to wash his hands to avoid spreading to others.  Will prescribe Pataday antihistamine drops for comfort and symptom relief.  Also  recommended use of TheraTears.   Final Clinical Impressions(s) / UC Diagnoses   Final diagnoses:  None   Discharge Instructions   None    ED Prescriptions   None    PDMP not reviewed this encounter.   Rose Phi, [place] 01/09/22 1045

## 2022-01-09 NOTE — ED Triage Notes (Signed)
Pt. Presets to UC w/ c/o left eye redness and pain since Friday. Pt. States this is a recurrent problem and has been using eye drops to treat himself.

## 2022-08-21 ENCOUNTER — Encounter (HOSPITAL_COMMUNITY): Payer: Self-pay | Admitting: Emergency Medicine

## 2022-08-21 ENCOUNTER — Ambulatory Visit (HOSPITAL_COMMUNITY)
Admission: EM | Admit: 2022-08-21 | Discharge: 2022-08-21 | Disposition: A | Discharge status: Home / Self Care | Payer: BC Managed Care – PPO | Attending: Internal Medicine | Admitting: Internal Medicine

## 2022-08-21 ENCOUNTER — Ambulatory Visit (INDEPENDENT_AMBULATORY_CARE_PROVIDER_SITE_OTHER): Payer: BC Managed Care – PPO

## 2022-08-21 DIAGNOSIS — R051 Acute cough: Secondary | ICD-10-CM | POA: Diagnosis not present

## 2022-08-21 DIAGNOSIS — J209 Acute bronchitis, unspecified: Secondary | ICD-10-CM

## 2022-08-21 DIAGNOSIS — S0502XA Injury of conjunctiva and corneal abrasion without foreign body, left eye, initial encounter: Secondary | ICD-10-CM

## 2022-08-21 MED ORDER — ERYTHROMYCIN 5 MG/GM OP OINT: 1.0000 | TOPICAL_OINTMENT | Freq: Four times a day (QID) | OPHTHALMIC | 0 refills | Status: DC

## 2022-08-21 MED ORDER — DOXYCYCLINE HYCLATE 100 MG PO CAPS: 100.0000 mg | ORAL_CAPSULE | Freq: Two times a day (BID) | ORAL | 0 refills | Status: AC

## 2022-08-21 NOTE — ED Provider Notes (Signed)
[facility]   Note:  This document was prepared using Dragon voice recognition software and may include unintentional dictation errors.  MRN: [id number] DOB: Dec 14, 1960 DATE: 08/21/22   Subjective:  Chief Complaint:  Chief Complaint  Patient presents with   Eye Problem     HPI: Sean Valencia is a 62 y.o. adult presenting for left eye irritation for 4 days and dry cough for 3 weeks.  Patient states on Friday he noticed that his left eye felt scratchy.  He has not had any discharge or watering from that eye.  But has had slight light sensitivity.  Today he started using over-the-counter saline drops which has helped some with the irritation.  He denies any pain or vision changes.  He wears glasses and does not wear any contacts.  He states he is due for his annual eye exam.  Patient also presents for dry cough for the past 3 weeks.  He states he feels as if he has some chest congestion.  Denies any fevers.  He has not taken anything over-the-counter for symptoms.  Denies fever, nausea/vomiting, abdominal pain, headache, vision changes, vision loss. Endorses eye irritation, eye redness, dry cough, congestion. Presents NAD.  Prior to Admission medications   Medication Sig Start Date End Date Taking? Authorizing Provider  doxycycline (VIBRAMYCIN) 100 MG capsule Take 1 capsule (100 mg total) by mouth 2 (two) times daily for 10 days. 08/21/22 08/31/22 Yes Lillieann Pavlich P, PA-C  erythromycin ophthalmic ointment Place 1 Application into the left eye 4 (four) times daily for 7 days. 08/21/22 08/28/22 Yes Jnaya Butrick P, PA-C  acetaminophen (TYLENOL) 500 MG tablet Take by mouth.    [provider]  amLODipine (NORVASC) 10 MG tablet Take 1 tablet (10 mg total) by mouth daily. 11/09/20   Rhetta Mura, MD  amLODipine (NORVASC) 10 MG tablet Take 1 tablet by mouth daily. 08/18/21   [provider]  azithromycin (ZITHROMAX) 250 MG tablet Take by mouth. 11/05/21   [provider]  gentamicin (GARAMYCIN) 0.3 % ophthalmic solution Place 2 drops into both eyes every 4 (four) hours. 09/20/21   Carlisle Beers, FNP  LINZESS 145 MCG CAPS capsule Take 145 mcg by mouth daily. 11/02/21   [provider]  olopatadine (PATANOL) 0.1 % ophthalmic solution Place 1 drop into the left eye 2 (two) times daily. 01/09/22   Immordino, Jeannett Senior, FNP  oxyCODONE-acetaminophen (PERCOCET/ROXICET) 5-325 MG tablet Take 1 tablet by mouth every 4 (four) hours as needed for moderate pain. 11/08/20   Berna Bue, MD  tobramycin (TOBREX) 0.3 % ophthalmic solution SMARTSIG:In Eye(s) 11/05/21   [provider]     No Known Allergies  History:   Past Medical History:  Diagnosis Date   Allergy    Anal fistula    Arthritis    History of kidney stones    HTN (hypertension)      Past Surgical History:  Procedure Laterality Date   CHOLECYSTECTOMY N/A 11/05/2020   Procedure: LAPAROSCOPIC CHOLECYSTECTOMY SINGLE SITE;  Surgeon: Karie Soda, MD;  Location: [facility];  Service: General;  Laterality: N/A;   ENDOSCOPIC RETROGRADE CHOLANGIOPANCREATOGRAPHY (ERCP) WITH PROPOFOL N/A 11/04/2020   Procedure: ENDOSCOPIC RETROGRADE CHOLANGIOPANCREATOGRAPHY (ERCP) WITH PROPOFOL;  Surgeon: Lynann Bologna, MD;  Location: [facility];  Service: Endoscopy;  Laterality: N/A;   INGUINAL HERNIA REPAIR Bilateral 1992   REMOVAL OF STONES  11/04/2020   Procedure: REMOVAL OF STONES;  Surgeon: Lynann Bologna, MD;  Location: [facility];  Service: Endoscopy;;  SPHINCTEROTOMY  11/04/2020   Procedure: SPHINCTEROTOMY;  Surgeon: Lynann Bologna, MD;  Location: [facility];  Service: Endoscopy;;    Family History  Problem Relation Age of Onset   Diabetes Father    Hypertension Father    Bone cancer Maternal Grandfather    Diabetes Paternal Grandmother    Diabetes Paternal Grandfather    Leukemia Paternal Grandfather    Hypercalcemia Brother    Colon cancer Neg Hx    Esophageal cancer Neg  Hx    Stomach cancer Neg Hx    Rectal cancer Neg Hx     Social History   Tobacco Use   Smoking status: Never   Smokeless tobacco: Never  Vaping Use   Vaping Use: Never used  Substance Use Topics   Alcohol use: Yes    Comment: 4 per year   Drug use: No    Review of Systems  Constitutional:  Negative for fever.  HENT:  Positive for congestion. Negative for rhinorrhea and sore throat.   Eyes:  Positive for photophobia, redness and itching. Negative for pain, discharge and visual disturbance.  Respiratory:  Positive for cough.   Gastrointestinal:  Negative for abdominal pain, nausea and vomiting.  Neurological:  Negative for headaches.     Objective:   Vitals: BP (!) 160/91 (BP Location: Left Arm)   Pulse 76   Temp 98.4 F (36.9 C) (Oral)   Resp 18   SpO2 97%   Physical Exam Constitutional:      General: He is not in acute distress.    Appearance: Normal appearance. He is well-developed. He is obese. He is not ill-appearing or toxic-appearing.     Comments: Patient presents with a cane  HENT:     Head: Normocephalic and atraumatic.  Eyes:     General: Lids are normal.        Right eye: No discharge.        Left eye: No discharge.     Extraocular Movements: Extraocular movements intact.     Conjunctiva/sclera:     Right eye: Right conjunctiva is not injected.     Left eye: Left conjunctiva is injected.     Pupils: Pupils are equal, round, and reactive to light.     Left eye: Fluorescein uptake present.     Comments: Left conjunctive injected.  Small abrasion noted on fluorescein at left lower eye laterally.  No discharge.  Cardiovascular:     Rate and Rhythm: Normal rate and regular rhythm.     Heart sounds: Normal heart sounds.  Pulmonary:     Effort: Pulmonary effort is normal.     Breath sounds: Normal breath sounds.     Comments: Clear to auscultation bilaterally  Abdominal:     General: Bowel sounds are normal.     Palpations: Abdomen is soft.      Tenderness: There is no abdominal tenderness.  Skin:    General: Skin is warm and dry.  Neurological:     General: No focal deficit present.     Mental Status: He is alert.  Psychiatric:        Mood and Affect: Mood and affect normal.     Results:  Labs: No results found for this or any previous visit (from the past 24 hour(s)).  Radiology: DG Chest 2 View  Result Date: 08/21/2022 CLINICAL DATA:  Cough for several days, initial encounter EXAM: CHEST - 2 VIEW COMPARISON:  None Available. FINDINGS: The heart size and mediastinal contours are within  normal limits. Both lungs are clear. The visualized skeletal structures are unremarkable. IMPRESSION: No active cardiopulmonary disease. Electronically Signed   By: Alcide Clever M.D.   On: 08/21/2022 20:00     UC Course/Treatments:  Procedures: Procedures   Medications Ordered in UC: Medications - No data to display   Assessment and Plan :     ICD-10-CM   1. Acute bronchitis, unspecified organism  J20.9     2. Acute cough  R05.1     3. Abrasion of left cornea, initial encounter  S05.02XA      Acute bronchitis, unspecified organism Afebrile, nontoxic-appearing, NAD. VSS. DDX includes but not limited to: Pneumonia, bronchitis, COPD, asthma, CHF Imaging was unremarkable today in office. Doxycycline 100mg  BID was prescribed. Strict ED precautions were given and patient verbalized understanding.  Acute cough See notes above  Abrasion of left cornea, initial encounter Afebrile, nontoxic-appearing, NAD. VSS. DDX includes but not limited to: Conjunctivitis, abrasion, ulceration On fluorescein stain small amount of uptake noted on left lower eye laterally. Erythromycin ointment 4 times QD was prescribed.  Recommend follow-up with ophthalmologist strict ED precautions were given and patient verbalized understanding.   ED Discharge Orders          Ordered    doxycycline (VIBRAMYCIN) 100 MG capsule  2 times daily        08/21/22  2007    erythromycin ophthalmic ointment  4 times daily        08/21/22 2007             PDMP not reviewed this encounter.     Cynda Ac[place], PA-C 08/21/22 2022

## 2022-08-21 NOTE — ED Triage Notes (Signed)
Since Friday left eye was scratchy. Took some saline drops this morning that helped some. Denies drainage.  Pt also c/o dry cough for 3 weeks. Taking honey and cough syrup.

## 2022-08-21 NOTE — Discharge Instructions (Signed)
You have been prescribed Erythromycin for your eyes. Take the drops as directed. Recommend you follow up with an ophthalmologist as soon as possible.  Our office is limited in the tests and imaging we can perform at our facility.  Therefore, it is very important for you to pay attention to any new symptoms or worsening of your current condition.   Please go directly to the Emergency Department immediately should you begin to feel worse in any way or have any of the following symptoms: increased pain, increased discharge, vision changes, loss of vision, nausea/vomiting or headache.   A prescription was sent for doxycycline. This is an antibiotic used to treat upper respiratory symptoms. Take as directed.   Return in 2 to 3 days if no improvement. It is very important for you to pay attention to any new symptoms or worsening of your current condition.  Please go directly to the Emergency Department immediately should you begin to have any of the following symptoms: shortness of breath, chest pain or difficulty breathing.

## 2022-08-24 ENCOUNTER — Ambulatory Visit
Admission: EM | Admit: 2022-08-24 | Discharge: 2022-08-24 | Disposition: A | Discharge status: Home / Self Care | Payer: BC Managed Care – PPO | Attending: Emergency Medicine | Admitting: Emergency Medicine

## 2022-08-24 DIAGNOSIS — H1032 Unspecified acute conjunctivitis, left eye: Secondary | ICD-10-CM | POA: Diagnosis not present

## 2022-08-24 DIAGNOSIS — R051 Acute cough: Secondary | ICD-10-CM

## 2022-08-24 MED ORDER — POLYMYXIN B-TRIMETHOPRIM 10000-0.1 UNIT/ML-% OP SOLN: 1.0000 [drp] | Freq: Four times a day (QID) | OPHTHALMIC | 0 refills | Status: AC

## 2022-08-24 NOTE — Discharge Instructions (Signed)
Use the antibiotic eyedrops as prescribed.    Follow-up with your primary care provider.    Go to the emergency department if you have acute eye pain, changes in your vision, or other concerning symptoms.

## 2022-08-24 NOTE — ED Triage Notes (Addendum)
Patient to Urgent Care with complaints of headaches/ sinus pressure and left sided eye redness and swelling. Tender to the touch w/ light sensitivity. Reports symptoms started seven days ago. Denies any fevers.   Patient was seen at [facility] three days ago and prescribed doxycycline and erythromycin ointment. Reports his symptoms have not improved/ feels as though he has a sinus infection. Also using saline drops in his eyes.

## 2022-08-24 NOTE — ED Provider Notes (Signed)
Sean Valencia    CSN: [id number] Arrival date & time: 08/24/22  1245      History   Chief Complaint Chief Complaint  Patient presents with   Eye Problem   Headache    HPI Sean Valencia is a 62 y.o. adult.  Patient presents with left eye redness and irritation x 1 week.  He also has sinus congestion and cough.  No fever, eye drainage, sore throat, shortness of breath, or other symptoms.  Patient was seen at [facility] urgent care in [place] on 08/21/2022; diagnosed with acute bronchitis, cough, left corneal abrasion; treated with oral doxycycline and erythromycin eye ointment.  His cough has improved but he still has redness of his left eye.    The history is provided by the patient and medical records.    Past Medical History:  Diagnosis Date   Allergy    Anal fistula    Arthritis    History of kidney stones    HTN (hypertension)     Patient Active Problem List   Diagnosis Date Noted   Acute maxillary sinusitis 01/09/2022   Chronic constipation 01/09/2022   Perirectal abscess 01/09/2022   Urinary tract infectious disease 01/09/2022   Acute pancreatitis 11/02/2020   Elevated blood pressure reading 11/02/2020   Gallstones 11/02/2020   Gallstone pancreatitis 11/02/2020    Past Surgical History:  Procedure Laterality Date   CHOLECYSTECTOMY N/A 11/05/2020   Procedure: LAPAROSCOPIC CHOLECYSTECTOMY SINGLE SITE;  Surgeon: Karie Soda, MD;  Location: [facility];  Service: General;  Laterality: N/A;   ENDOSCOPIC RETROGRADE CHOLANGIOPANCREATOGRAPHY (ERCP) WITH PROPOFOL N/A 11/04/2020   Procedure: ENDOSCOPIC RETROGRADE CHOLANGIOPANCREATOGRAPHY (ERCP) WITH PROPOFOL;  Surgeon: Lynann Bologna, MD;  [facility]: [facility];  Service: Endoscopy;  Laterality: N/A;   INGUINAL HERNIA REPAIR Bilateral 1992   REMOVAL OF STONES  11/04/2020   Procedure: REMOVAL OF STONES;  Surgeon: Lynann Bologna, MD;  Location: [facility];  Service: Endoscopy;;   SPHINCTEROTOMY  11/04/2020   Procedure:  Dennison Mascot;  Surgeon: Lynann Bologna, MD;  [facility]: [facility];  Service: Endoscopy;;    OB History   No obstetric history on file.      Home Medications    Prior to Admission medications   Medication Sig Start Date End Date Taking? Authorizing Provider  trimethoprim-polymyxin b (POLYTRIM) ophthalmic solution Place 1 drop into both eyes 4 (four) times daily for 7 days. 08/24/22 08/31/22 Yes Mickie Bail, NP  acetaminophen (TYLENOL) 500 MG tablet Take by mouth.    [provider]  amLODipine (NORVASC) 10 MG tablet Take 1 tablet (10 mg total) by mouth daily. 11/09/20   Rhetta Mura, MD  amLODipine (NORVASC) 10 MG tablet Take 1 tablet by mouth daily. 08/18/21   [provider]  azithromycin (ZITHROMAX) 250 MG tablet Take by mouth. Patient not taking: Reported on 08/24/2022 11/05/21   [provider]  doxycycline (VIBRAMYCIN) 100 MG capsule Take 1 capsule (100 mg total) by mouth 2 (two) times daily for 10 days. 08/21/22 08/31/22  Hermanns, Ashlee P, PA-C  LINZESS 145 MCG CAPS capsule Take 145 mcg by mouth daily. 11/02/21   [provider]  olopatadine (PATANOL) 0.1 % ophthalmic solution Place 1 drop into the left eye 2 (two) times daily. Patient not taking: Reported on 08/24/2022 01/09/22   Immordino, Jeannett Senior, FNP  oxyCODONE-acetaminophen (PERCOCET/ROXICET) 5-325 MG tablet Take 1 tablet by mouth every 4 (four) hours as needed for moderate pain. Patient not taking: Reported on 08/24/2022 11/08/20   Berna Bue, MD  Family History Family History  Problem Relation Age of Onset   Diabetes Father    Hypertension Father    Bone cancer Maternal Grandfather    Diabetes Paternal Grandmother    Diabetes Paternal Grandfather    Leukemia Paternal Grandfather    Hypercalcemia Brother    Colon cancer Neg Hx    Esophageal cancer Neg Hx    Stomach cancer Neg Hx    Rectal cancer Neg Hx     Social History Social History   Tobacco Use   Smoking status:  Never   Smokeless tobacco: Never  Vaping Use   Vaping Use: Never used  Substance Use Topics   Alcohol use: Yes    Comment: 4 per year   Drug use: No     Allergies   Patient has no known allergies.   Review of Systems Review of Systems  Constitutional:  Negative for chills and fever.  HENT:  Positive for congestion. Negative for ear pain and sore throat.   Eyes:  Positive for redness. Negative for pain, discharge and visual disturbance.  Respiratory:  Positive for cough. Negative for shortness of breath.   Cardiovascular:  Negative for chest pain and palpitations.  Skin:  Negative for color change and rash.     Physical Exam Triage Vital Signs ED Triage Vitals  Enc Vitals Group     BP      Pulse      Resp      Temp      Temp src      SpO2      Weight      Height      Head Circumference      Peak Flow      Pain Score      Pain Loc      Pain Edu?      Excl. in GC?    No data found.  Updated Vital Signs BP 126/79   Pulse 82   Temp 98 F (36.7 C)   Resp 17   SpO2 96%   Visual Acuity Right Eye Distance:   Left Eye Distance:   Bilateral Distance:    Right Eye Near:   Left Eye Near:    Bilateral Near:     Physical Exam Constitutional:      General: He is not in acute distress. HENT:     Right Ear: Tympanic membrane normal.     Left Ear: Tympanic membrane normal.     Nose: Nose normal.     Mouth/Throat:     Mouth: Mucous membranes are moist.     Pharynx: Oropharynx is clear.  Eyes:     General: Lids are normal. Vision grossly intact.     Extraocular Movements: Extraocular movements intact.     Conjunctiva/sclera:     Right eye: Right conjunctiva is not injected.     Left eye: Left conjunctiva is injected.     Pupils: Pupils are equal, round, and reactive to light.  Cardiovascular:     Rate and Rhythm: Normal rate and regular rhythm.     Heart sounds: Normal heart sounds.  Pulmonary:     Effort: Pulmonary effort is normal. No respiratory  distress.     Breath sounds: Normal breath sounds.  Skin:    General: Skin is warm and dry.     Findings: No erythema.  Neurological:     Mental Status: He is alert.  Psychiatric:        Mood and  Affect: Mood normal.        Behavior: Behavior normal.      UC Treatments / Results  Labs (all labs ordered are listed, but only abnormal results are displayed) Labs Reviewed - No data to display  EKG   Radiology No results found.  Procedures Procedures (including critical care time)  Medications Ordered in UC Medications - No data to display  Initial Impression / Assessment and Plan / UC Course  I have reviewed the triage vital signs and the nursing notes.  Pertinent labs & imaging results that were available during my care of the patient were reviewed by me and considered in my medical decision making (see chart for details).    Left eye conjunctivitis, cough.  Lungs are clear and O2 sat is 96% on room air.  His cough is improving.  No fever.  Patient's left eye is injected.  Because his eye symptoms are not improving, changing to Polytrim eyedrops.  Instructed patient to schedule an appointment with his eye care provider.  ED precautions given.  Education provided on conjunctivitis.  He agrees to plan of care.  Final Clinical Impressions(s) / UC Diagnoses   Final diagnoses:  Acute conjunctivitis of left eye, unspecified acute conjunctivitis type  Acute cough     Discharge Instructions      Use the antibiotic eyedrops as prescribed.    Follow-up with your primary care provider.    Go to the emergency department if you have acute eye pain, changes in your vision, or other concerning symptoms.        ED Prescriptions     Medication Sig Dispense Auth. Provider   trimethoprim-polymyxin b (POLYTRIM) ophthalmic solution Place 1 drop into both eyes 4 (four) times daily for 7 days. 10 mL Mickie Bail, NP      PDMP not reviewed this encounter.   Mickie Bail,  NP 08/24/22 1321

## 2022-11-07 ENCOUNTER — Encounter (HOSPITAL_COMMUNITY): Payer: Self-pay

## 2022-11-07 ENCOUNTER — Ambulatory Visit (HOSPITAL_COMMUNITY)
Admission: EM | Admit: 2022-11-07 | Discharge: 2022-11-07 | Disposition: A | Discharge status: Home / Self Care | Payer: BC Managed Care – PPO | Attending: Emergency Medicine | Admitting: Emergency Medicine

## 2022-11-07 DIAGNOSIS — H1032 Unspecified acute conjunctivitis, left eye: Secondary | ICD-10-CM | POA: Diagnosis not present

## 2022-11-07 DIAGNOSIS — J0140 Acute pansinusitis, unspecified: Secondary | ICD-10-CM | POA: Diagnosis not present

## 2022-11-07 MED ORDER — AMOXICILLIN-POT CLAVULANATE 875-125 MG PO TABS: 1.0000 | ORAL_TABLET | Freq: Two times a day (BID) | ORAL | 0 refills | Status: DC

## 2022-11-07 MED ORDER — FLUTICASONE PROPIONATE 50 MCG/ACT NA SUSP: 2.0000 | Freq: Every day | NASAL | 0 refills | Status: AC

## 2022-11-07 NOTE — Discharge Instructions (Signed)
Finish the Augmentin, even if you feel better.  Start Flonase.  Use a NeilMed sinus rinse with distilled water as often as you want to to reduce nasal congestion. Follow the directions on the box.  Continue the steroid eyedrops as directed by ophthalmology.  Go to www.goodrx.com to look up your medications. This will give you a list of where you can find your prescriptions at the most affordable prices. Or you can ask the pharmacist what the cash price is. This is frequently cheaper than going through insurance.

## 2022-11-07 NOTE — ED Provider Notes (Signed)
HPI  SUBJECTIVE:  Sean Valencia is a 62 y.o. adult who presents with left frontal and maxillary sinus pain and pressure, mucoid rhinorrhea, left eye redness for the past week.  He reports mild photophobia that has since resolved and lateral eye pain with palpation and blinking only.  No nasal congestion, facial swelling, upper dental pain, fevers, visual changes, discharge, itching.  No foreign body sensation, headaches, halos around lights, eye pain worse in the dark, periorbital erythema, edema.  No antibiotics in the past month.  No antipyretic in the past 6 hours.  States that he gets a red eye like this when he gets sinus infections.  He tried heat, saline spray with improvement in his nasal congestion.  He has also tried steroid eyedrops with improvement in his eye redness.  There are no aggravating factors.  With a past medical history of frequent sinusitis accompanied with a red eye, hypertension.  No history of glaucoma, diabetes.  PCP: Tried in urgent care.  Ophthalmology: [facility].    Past Medical History:  Diagnosis Date   Allergy    Anal fistula    Arthritis    History of kidney stones    HTN (hypertension)     Past Surgical History:  Procedure Laterality Date   CHOLECYSTECTOMY N/A 11/05/2020   Procedure: LAPAROSCOPIC CHOLECYSTECTOMY SINGLE SITE;  Surgeon: Karie Soda, MD;  [facility];  Service: General;  Laterality: N/A;   ENDOSCOPIC RETROGRADE CHOLANGIOPANCREATOGRAPHY (ERCP) WITH PROPOFOL N/A 11/04/2020   Procedure: ENDOSCOPIC RETROGRADE CHOLANGIOPANCREATOGRAPHY (ERCP) WITH PROPOFOL;  Surgeon: Lynann Bologna, MD;  [facility];  Service: Endoscopy;  Laterality: N/A;   INGUINAL HERNIA REPAIR Bilateral 1992   REMOVAL OF STONES  11/04/2020   Procedure: REMOVAL OF STONES;  Surgeon: Lynann Bologna, MD;  [facility];  Service: Endoscopy;;   SPHINCTEROTOMY  11/04/2020   Procedure: Dennison Mascot;  Surgeon: Lynann Bologna, MD;  Location:  [facility];  Service: Endoscopy;;    Family History  Problem Relation Age of Onset   Diabetes Father    Hypertension Father    Bone cancer Maternal Grandfather    Diabetes Paternal Grandmother    Diabetes Paternal Grandfather    Leukemia Paternal Grandfather    Hypercalcemia Brother    Colon cancer Neg Hx    Esophageal cancer Neg Hx    Stomach cancer Neg Hx    Rectal cancer Neg Hx     Social History   Tobacco Use   Smoking status: Never   Smokeless tobacco: Never  Vaping Use   Vaping status: Never Used  Substance Use Topics   Alcohol use: Yes    Alcohol/week: 1.0 standard drink of alcohol    Types: 1 Glasses of wine per week    Comment: 4 per year   Drug use: No    No current facility-administered medications for this encounter.  Current Outpatient Medications:    amoxicillin-clavulanate (AUGMENTIN) 875-125 MG tablet, Take 1 tablet by mouth every 12 (twelve) hours., Disp: 14 tablet, Rfl: 0   fluticasone (FLONASE) 50 MCG/ACT nasal spray, Place 2 sprays into both nostrils daily., Disp: 16 g, Rfl: 0   acetaminophen (TYLENOL) 500 MG tablet, Take by mouth., Disp: , Rfl:    amLODipine (NORVASC) 10 MG tablet, Take 1 tablet (10 mg total) by mouth daily., Disp: 30 tablet, Rfl: 0   amLODipine (NORVASC) 10 MG tablet, Take 1 tablet by mouth daily., Disp: , Rfl:    LINZESS 145 MCG CAPS capsule, Take 145 mcg  by mouth daily., Disp: , Rfl:   No Known Allergies   ROS  As noted in HPI.   Physical Exam  BP 130/82 (BP Location: Right Arm)   Pulse 82   Temp 98.4 F (36.9 C) (Oral)   Ht 5\' 11"  (1.803 m)   Wt 117.9 kg   SpO2 98%   BMI 36.26 kg/m   Constitutional: Well developed, well nourished, no acute distress Eyes: PERRLA EOMI, diffuse left conjunctival injection.  No direct or consensual photophobia.  No pain with EOMs.  No periorbital erythema, edema, tenderness.  Cornea clear.  No hyphema.  No corneal abrasion seen on fluorescein exam IOP left 19 IOP right  22   Visual Acuity  Right Eye Distance: 20/20 Left Eye Distance: 20/25 Bilateral Distance: 20/20  Right Eye Near:   Left Eye Near:    Bilateral Near:          HENT: Normocephalic, atraumatic,mucus membranes moist.  Positive nasal congestion.  No maxillary, frontal sinus tenderness. Respiratory: Normal inspiratory effort Cardiovascular: Normal rate GI: nondistended skin: No rash, skin intact Musculoskeletal: no deformities Neurologic: Alert & oriented x 3, no focal neuro deficits Psychiatric: Speech and behavior appropriate   ED Course   Medications - No data to display  Orders Placed This Encounter  Procedures   Visual acuity screening    Standing Status:   Standing    Number of Occurrences:   1    No results found for this or any previous visit (from the past 24 hour(s)). No results found.  ED Clinical Impression  1. Acute non-recurrent pansinusitis   2. Acute conjunctivitis of left eye, unspecified acute conjunctivitis type      ED Assessment/Plan     Does not appear to be iritis, glaucoma, infectious conjunctivitis, foreign body.  Patient says this happens whenever he gets sinus infections.  Will send him home on Augmentin for a week, Flonase, saline nasal irrigation.  I do not think that he needs antibiotic eyedrops because this is not an infectious conjunctivitis.  He can continue the steroid drops per ophthalmology's recommendations.  He will follow-up with ophthalmology if no improvement.  ER return precautions given.  Discussed MDM, treatment plan, and plan for follow-up with patient. Discussed sn/sx that should prompt return to the ED. patient agrees with plan.   Meds ordered this encounter  Medications   amoxicillin-clavulanate (AUGMENTIN) 875-125 MG tablet    Sig: Take 1 tablet by mouth every 12 (twelve) hours.    Dispense:  14 tablet    Refill:  0   fluticasone (FLONASE) 50 MCG/ACT nasal spray    Sig: Place 2 sprays into both nostrils daily.     Dispense:  16 g    Refill:  0      *This clinic note was created using Scientist, clinical (histocompatibility and immunogenetics). Therefore, there may be occasional mistakes despite careful proofreading.  ?    Domenick Gong, MD 11/08/22 [phone]

## 2022-11-07 NOTE — ED Triage Notes (Addendum)
Patient presents with red, agitated right eye, states it feels like a sinus infection. Tx with saline spray and steroid drops. Pt also states it feels like he have a kidney stone on left side.

## 2023-04-05 ENCOUNTER — Ambulatory Visit (HOSPITAL_COMMUNITY)
Admission: EM | Admit: 2023-04-05 | Discharge: 2023-04-05 | Disposition: A | Discharge status: Home / Self Care | Payer: 59

## 2023-04-05 ENCOUNTER — Encounter (HOSPITAL_COMMUNITY): Payer: Self-pay

## 2023-04-05 DIAGNOSIS — M25562 Pain in left knee: Secondary | ICD-10-CM

## 2023-04-05 NOTE — Discharge Instructions (Signed)
Continue the Tylenol since it has helped, and follow with [facility] tomorrow

## 2023-04-05 NOTE — ED Provider Notes (Signed)
[facility]    CSN: [id number] Arrival date & time: 04/05/23  1901      History   Chief Complaint Chief Complaint  Patient presents with   Knee Pain    HPI Sean Valencia is a 63 y.o. adult who presents with L posterior knee since he went dancing 3 weeks ago. States he did twist it, but did not feel or hear a pop. 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 His last shot was November last year. He normally goes to [facility], but came here tonight since it was closer.     Past Medical History:  Diagnosis Date   Allergy    Anal fistula    Arthritis    History of kidney stones    HTN (hypertension)     Patient Active Problem List   Diagnosis Date Noted   Acute maxillary sinusitis 01/09/2022   Chronic constipation 01/09/2022   Perirectal abscess 01/09/2022   Urinary tract infectious disease 01/09/2022   Acute pancreatitis 11/02/2020   Elevated blood pressure reading 11/02/2020   Gallstones 11/02/2020   Gallstone pancreatitis 11/02/2020    Past Surgical History:  Procedure Laterality Date   CHOLECYSTECTOMY N/A 11/05/2020   Procedure: LAPAROSCOPIC CHOLECYSTECTOMY SINGLE SITE;  Surgeon: Karie Soda, MD;  [facility];  Service: General;  Laterality: N/A;   ENDOSCOPIC RETROGRADE CHOLANGIOPANCREATOGRAPHY (ERCP) WITH PROPOFOL N/A 11/04/2020   Procedure: ENDOSCOPIC RETROGRADE CHOLANGIOPANCREATOGRAPHY (ERCP) WITH PROPOFOL;  Surgeon: Lynann Bologna, MD;  [facility];  Service: Endoscopy;  Laterality: N/A;   INGUINAL HERNIA REPAIR Bilateral 1992   REMOVAL OF STONES  11/04/2020   Procedure: REMOVAL OF STONES;  Surgeon: Lynann Bologna, MD;  [facility];  Service: Endoscopy;;   SPHINCTEROTOMY  11/04/2020   Procedure: Dennison Mascot;  Surgeon: Lynann Bologna, MD;  [facility];  Service: Endoscopy;;    OB History   No obstetric history on file.      Home Medications    Prior to Admission medications    Medication Sig Start Date End Date Taking? Authorizing Provider  acetaminophen (TYLENOL) 500 MG tablet Take by mouth.    [provider]  amLODipine (NORVASC) 10 MG tablet Take 1 tablet (10 mg total) by mouth daily. 11/09/20   Rhetta Mura, MD  fluticasone (FLONASE) 50 MCG/ACT nasal spray Place 2 sprays into both nostrils daily. 11/07/22   Domenick Gong, MD  LINZESS 145 MCG CAPS capsule Take 145 mcg by mouth daily. 11/02/21   [provider]    Family History Family History  Problem Relation Age of Onset   Diabetes Father    Hypertension Father    Bone cancer Maternal Grandfather    Diabetes Paternal Grandmother    Diabetes Paternal Grandfather    Leukemia Paternal Grandfather    Hypercalcemia Brother    Colon cancer Neg Hx    Esophageal cancer Neg Hx    Stomach cancer Neg Hx    Rectal cancer Neg Hx     Social History Social History   Tobacco Use   Smoking status: Never   Smokeless tobacco: Never  Vaping Use   Vaping status: Never Used  Substance Use Topics   Alcohol use: Yes    Alcohol/week: 1.0 standard drink of alcohol    Types: 1 Glasses of wine per week    Comment: 4 per year   Drug use: No     Allergies   Patient has no known allergies.   Review of  Systems Review of Systems As noted in HPI  Physical Exam Triage Vital Signs ED Triage Vitals [04/05/23 1923]  Encounter Vitals Group     BP (!) 147/83     Systolic BP Percentile      Diastolic BP Percentile      Pulse Rate 75     Resp 18     Temp 97.8 F (36.6 C)     Temp Source Oral     SpO2 99 %     Weight      Height      Head Circumference      Peak Flow      Pain Score 3     Pain Loc      Pain Education      Exclude from Growth Chart    No data found.  Updated Vital Signs BP (!) 147/83 (BP Location: Right Arm)   Pulse 75   Temp 97.8 F (36.6 C) (Oral)   Resp 18   SpO2 99%   Visual Acuity Right Eye Distance:   Left Eye Distance:   Bilateral Distance:     Right Eye Near:   Left Eye Near:    Bilateral Near:     Physical Exam Vitals and nursing note reviewed.  Constitutional:      Appearance: He is obese.     Comments: Limping and using crutches  HENT:     Right Ear: External ear normal.     Left Ear: External ear normal.  Eyes:     Conjunctiva/sclera: Conjunctivae normal.  Pulmonary:     Effort: Pulmonary effort is normal.  Musculoskeletal:     Comments: L KNEE- no effusion noted, ROM is normal, but straightening it and flexing it > 90 degrees causes pain and is tight. The posterior knee has fullness compared to the L and is tender.   Skin:    General: Skin is warm and dry.     Findings: No bruising or rash.  Neurological:     Mental Status: He is alert and oriented to person, place, and time.     Gait: Gait abnormal.  Psychiatric:        Mood and Affect: Mood normal.        Behavior: Behavior normal.        Thought Content: Thought content normal.        Judgment: Judgment normal.      UC Treatments / Results  Labs (all labs ordered are listed, but only abnormal results are displayed) Labs Reviewed - No data to display  EKG   Radiology No results found.  Procedures Procedures (including critical care time)  Medications Ordered in UC Medications - No data to display  Initial Impression / Assessment and Plan / UC Course  I have reviewed the triage vital signs and the nursing notes.  Posterior L knee pain, possibly Baker's cyst  He will continue Tylenol  since it helps and declined anything else stronger Will FU with his ortho tomorrow.     Final Clinical Impressions(s) / UC Diagnoses   Final diagnoses:  Acute pain of left knee     Discharge Instructions      Continue the Tylenol since it has helped, and follow with [facility] tomorrow     ED Prescriptions   None    I have reviewed the PDMP during this encounter.   Garey Ham, [place] 04/05/23 1944

## 2023-04-05 NOTE — ED Triage Notes (Signed)
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 States worse at night. Took tylenol with relief. Walking with a cane d/t pain.

## 2023-10-10 ENCOUNTER — Encounter: Payer: Self-pay | Admitting: Gastroenterology

## 2023-12-03 ENCOUNTER — Ambulatory Visit (AMBULATORY_SURGERY_CENTER)

## 2023-12-03 ENCOUNTER — Encounter: Payer: Self-pay | Admitting: Gastroenterology

## 2023-12-03 VITALS — Ht 70.0 in | Wt 260.0 lb

## 2023-12-03 DIAGNOSIS — Z860100 Personal history of colon polyps, unspecified: Secondary | ICD-10-CM

## 2023-12-03 MED ORDER — NA SULFATE-K SULFATE-MG SULF 17.5-3.13-1.6 GM/177ML PO SOLN: 1.0000 | Freq: Once | ORAL | 0 refills | Status: AC

## 2023-12-03 NOTE — Progress Notes (Signed)
 No egg or soy allergy known to patient  No issues known to pt with past sedation with any surgeries or procedures Patient denies ever being told they had issues or difficulty with intubation  No FH of Malignant Hyperthermia Pt is not on diet pills Pt is not on  home 02  Pt is not on blood thinners  Pt denies issues with constipation  No A fib or A flutter Have any cardiac testing pending-- no  LOA: independent  Prep: suprep   Patient's chart reviewed by Norleen Schillings CNRA prior to previsit and patient appropriate for the [facility].  Previsit completed and red dot placed by patient's name on their procedure day (on provider's schedule).     PV completed with patient. Prep instructions sent via home address.

## 2023-12-24 ENCOUNTER — Encounter: Payer: Self-pay | Admitting: Gastroenterology

## 2023-12-24 ENCOUNTER — Ambulatory Visit: Admitting: Gastroenterology

## 2023-12-24 VITALS — BP 136/75 | HR 70 | Temp 97.8°F | Resp 10 | Ht 71.0 in | Wt 255.0 lb

## 2023-12-24 DIAGNOSIS — Z860101 Personal history of adenomatous and serrated colon polyps: Secondary | ICD-10-CM

## 2023-12-24 DIAGNOSIS — K648 Other hemorrhoids: Secondary | ICD-10-CM

## 2023-12-24 DIAGNOSIS — K5730 Diverticulosis of large intestine without perforation or abscess without bleeding: Secondary | ICD-10-CM | POA: Diagnosis not present

## 2023-12-24 DIAGNOSIS — D122 Benign neoplasm of ascending colon: Secondary | ICD-10-CM

## 2023-12-24 DIAGNOSIS — K6389 Other specified diseases of intestine: Secondary | ICD-10-CM

## 2023-12-24 DIAGNOSIS — Z860100 Personal history of colon polyps, unspecified: Secondary | ICD-10-CM

## 2023-12-24 DIAGNOSIS — D123 Benign neoplasm of transverse colon: Secondary | ICD-10-CM

## 2023-12-24 DIAGNOSIS — Z1211 Encounter for screening for malignant neoplasm of colon: Secondary | ICD-10-CM

## 2023-12-24 NOTE — Op Note (Signed)
 [facility] Patient Name: Sean Valencia Procedure Date: 12/24/2023 9:15 AM MRN: [id number] Endoscopist: Gustav ALONSO Mcgee , MD, [id number] Age: 63 Referring MD:  Date of Birth: October 01, 1960 Gender: Male Account #: [id number] Procedure:                Colonoscopy Indications:              High risk colon cancer surveillance: Personal                            history of colonic polyps, High risk colon cancer                            surveillance: Personal history of adenoma (10 mm or                            greater in size), High risk colon cancer                            surveillance: Personal history of multiple (3 or                            more) adenomas Medicines:                Monitored Anesthesia Care Procedure:                Pre-Anesthesia Assessment:                           - Prior to the procedure, a History and Physical                            was performed, and patient medications and                            allergies were reviewed. The patient's tolerance of                            previous anesthesia was also reviewed. The risks                            and benefits of the procedure and the sedation                            options and risks were discussed with the patient.                            All questions were answered, and informed consent                            was obtained. Prior Anticoagulants: The patient has                            taken no anticoagulant or antiplatelet agents. ASA  Grade Assessment: II - A patient with mild systemic                            disease. After reviewing the risks and benefits,                            the patient was deemed in satisfactory condition to                            undergo the procedure.                           After obtaining informed consent, the colonoscope                            was passed under direct vision. Throughout the                             procedure, the patient's blood pressure, pulse, and                            oxygen saturations were monitored continuously. The                            PCF-HQ190L Colonoscope [id number] was introduced                            through the anus and advanced to the the cecum,                            identified by appendiceal orifice and ileocecal                            valve. The colonoscopy was performed without                            difficulty. The patient tolerated the procedure                            well. The quality of the bowel preparation was                            good. The ileocecal valve, appendiceal orifice, and                            rectum were photographed. Scope In: 9:25:20 AM Scope Out: 9:42:29 AM Scope Withdrawal Time: 0 hours 13 minutes 38 seconds  Total Procedure Duration: 0 hours 17 minutes 9 seconds  Findings:                 The perianal and digital rectal examinations were                            normal.  A 2 mm polyp was found in the ascending colon. The                            polyp was sessile. The polyp was removed with a                            cold biopsy forceps. Resection and retrieval were                            complete.                           Two sessile polyps were found in the transverse                            colon. The polyps were 4 to 5 mm in size. These                            polyps were removed with a cold snare. Resection                            and retrieval were complete.                           Scattered medium-mouthed and small-mouthed                            diverticula were found in the sigmoid colon and                            descending colon. There was narrowing of the colon                            in association with the diverticular opening. There                            was evidence of diverticular spasm.                             Peri-diverticular erythema was seen. There was                            evidence of an impacted diverticulum.                           Non-bleeding external and internal hemorrhoids were                            found during retroflexion. The hemorrhoids were                            medium-sized. Complications:            No immediate complications. Estimated Blood Loss:     Estimated blood loss was minimal. Impression:               -  One 2 mm polyp in the ascending colon, removed                            with a cold biopsy forceps. Resected and retrieved.                           - Two 4 to 5 mm polyps in the transverse colon,                            removed with a cold snare. Resected and retrieved.                           - Moderate diverticulosis in the sigmoid colon and                            in the descending colon. There was narrowing of the                            colon in association with the diverticular opening.                            There was evidence of diverticular spasm.                            Peri-diverticular erythema was seen. There was                            evidence of an impacted diverticulum.                           - Non-bleeding external and internal hemorrhoids. Recommendation:           - Patient has a contact number available for                            emergencies. The signs and symptoms of potential                            delayed complications were discussed with the                            patient. Return to normal activities tomorrow.                            Written discharge instructions were provided to the                            patient.                           - Resume previous diet.                           - Continue present medications.                           -  Await pathology results.                           - Repeat colonoscopy in 3 - 5 years for                             surveillance based on pathology results. Anira Senegal V. Camber Ninh, MD 12/24/2023 9:52:04 AM This report has been signed electronically.

## 2023-12-24 NOTE — Progress Notes (Signed)
 Report given to PACU, vss

## 2023-12-24 NOTE — Progress Notes (Signed)
 Called to room to assist during endoscopic procedure.  Patient ID and intended procedure confirmed with present staff. Received instructions for my participation in the procedure from the performing physician.

## 2023-12-24 NOTE — Patient Instructions (Signed)

## 2023-12-24 NOTE — Progress Notes (Signed)
 [facility] History and Physical   Primary Care Physician:  [facility], [place]   Reason for Procedure:  History of adenomatous colon polyps  Plan:    Surveillance colonoscopy with possible interventions as needed     HPI: Sean Valencia is a very pleasant 63 y.o. adult here for surveillance colonoscopy. Denies any nausea, vomiting, abdominal pain, melena or bright red blood per rectum  The risks and benefits as well as alternatives of endoscopic procedure(s) have been discussed and reviewed. All questions answered. The patient agrees to proceed.    Past Medical History:  Diagnosis Date   Allergy    Anal fistula    Arthritis    History of kidney stones    HTN (hypertension)     Past Surgical History:  Procedure Laterality Date   CHOLECYSTECTOMY N/A 11/05/2020   Procedure: LAPAROSCOPIC CHOLECYSTECTOMY SINGLE SITE;  Surgeon: Sheldon Standing, MD;  Location: [facility];  Service: General;  Laterality: N/A;   ENDOSCOPIC RETROGRADE CHOLANGIOPANCREATOGRAPHY (ERCP) WITH PROPOFOL  N/A 11/04/2020   Procedure: ENDOSCOPIC RETROGRADE CHOLANGIOPANCREATOGRAPHY (ERCP) WITH PROPOFOL ;  Surgeon: Charlanne Groom, MD;  Location: [facility];  Service: Endoscopy;  Laterality: N/A;   INGUINAL HERNIA REPAIR Bilateral 1992   REMOVAL OF STONES  11/04/2020   Procedure: REMOVAL OF STONES;  Surgeon: Charlanne Groom, MD;  Location: [facility];  Service: Endoscopy;;   SPHINCTEROTOMY  11/04/2020   Procedure: ANNETT;  Surgeon: Charlanne Groom, MD;  Location: [facility];  Service: Endoscopy;;    Prior to Admission medications   Medication Sig Start Date End Date Taking? Authorizing Provider  acetaminophen  (TYLENOL ) 500 MG tablet Take by mouth. Patient taking differently: Take 500 mg by mouth as needed.   Yes [provider]  amLODipine  (NORVASC ) 10 MG tablet Take 1 tablet (10 mg total) by mouth daily. 11/09/20  Yes Samtani, Jai-Gurmukh, MD  fluticasone  (FLONASE ) 50 MCG/ACT  nasal spray Place 2 sprays into both nostrils daily. Patient taking differently: Place 2 sprays into both nostrils as needed. 11/07/22   Van Knee, MD  gentamicin  (GARAMYCIN ) 0.3 % ophthalmic solution Place 2 drops into both eyes as needed.    [provider]  LINZESS 145 MCG CAPS capsule Take 145 mcg by mouth daily. Patient not taking: No sig reported 11/02/21   [provider]  loratadine (CLARITIN) 10 MG tablet Take 10 mg by mouth daily as needed.    [provider]  naproxen (NAPROSYN) 500 MG tablet Take 500 mg by mouth 2 (two) times daily as needed. 03/11/22   [provider]  olopatadine  (PATANOL) 0.1 % ophthalmic solution Place 1 drop into both eyes as needed.    [provider]    Current Outpatient Medications  Medication Sig Dispense Refill   acetaminophen  (TYLENOL ) 500 MG tablet Take by mouth. (Patient taking differently: Take 500 mg by mouth as needed.)     amLODipine  (NORVASC ) 10 MG tablet Take 1 tablet (10 mg total) by mouth daily. 30 tablet 0   fluticasone  (FLONASE ) 50 MCG/ACT nasal spray Place 2 sprays into both nostrils daily. (Patient taking differently: Place 2 sprays into both nostrils as needed.) 16 g 0   gentamicin  (GARAMYCIN ) 0.3 % ophthalmic solution Place 2 drops into both eyes as needed.     LINZESS 145 MCG CAPS capsule Take 145 mcg by mouth daily. (Patient not taking: No sig reported)     loratadine (CLARITIN) 10 MG tablet Take 10 mg by mouth daily as needed.     naproxen (NAPROSYN) 500  MG tablet Take 500 mg by mouth 2 (two) times daily as needed.     olopatadine  (PATANOL) 0.1 % ophthalmic solution Place 1 drop into both eyes as needed.     No current facility-administered medications for this visit.    Allergies as of 12/24/2023   (No Known Allergies)    Family History  Problem Relation Age of Onset   Diabetes Father    Hypertension Father    Bone cancer Maternal Grandfather    Diabetes Paternal  Grandmother    Diabetes Paternal Grandfather    Leukemia Paternal Grandfather    Hypercalcemia Brother    Colon cancer Neg Hx    Esophageal cancer Neg Hx    Stomach cancer Neg Hx    Rectal cancer Neg Hx     Social History   Socioeconomic History   Marital status: Married    Spouse name: Not on file   Number of children: 5   Years of education: Not on file   Highest education level: Not on file  Occupational History   Occupation: Teacher  Tobacco Use   Smoking status: Never   Smokeless tobacco: Never  Vaping Use   Vaping status: Never Used  Substance and Sexual Activity   Alcohol use: Yes    Alcohol/week: 1.0 standard drink of alcohol    Types: 1 Glasses of wine per week    Comment: 4 per year   Drug use: No   Sexual activity: Yes    Birth control/protection: None  Other Topics Concern   Not on file  Social History Narrative   Not on file   Social Drivers of Health   Financial Resource Strain: Not on file  Food Insecurity: Not on file  Transportation Needs: Not on file  Physical Activity: Not on file  Stress: Not on file  Social Connections: Not on file  Intimate Partner Violence: Not on file    Review of Systems:  All other review of systems negative except as mentioned in the HPI.  Physical Exam: Vital signs in last 24 hours: BP 138/69   Pulse 73   Temp 97.8 F (36.6 C)   Ht 5' 11 (1.803 m)   Wt 255 lb (115.7 kg)   SpO2 99%   BMI 35.57 kg/m  General:   Alert, NAD Lungs:  Clear .   Heart:  Regular rate and rhythm Abdomen:  Soft, nontender and nondistended. Neuro/Psych:  Alert and cooperative. Normal mood and affect. A and O x 3  Reviewed labs, radiology imaging, old records and pertinent past GI work up  Patient is appropriate for planned procedure(s) and anesthesia in an ambulatory setting   K. Veena Devery Odwyer , MD [phone]

## 2023-12-24 NOTE — Progress Notes (Signed)
 Pt's states no medical or surgical changes since previsit or office visit.

## 2023-12-25 ENCOUNTER — Telehealth: Payer: Self-pay | Admitting: *Deleted

## 2023-12-25 NOTE — Telephone Encounter (Signed)
 Left message on f/u call

## 2023-12-26 ENCOUNTER — Ambulatory Visit: Payer: Self-pay | Admitting: Gastroenterology

## 2023-12-26 LAB — SURGICAL PATHOLOGY

## 2024-01-12 ENCOUNTER — Encounter: Payer: Self-pay | Admitting: Emergency Medicine

## 2024-01-12 ENCOUNTER — Ambulatory Visit
Admission: EM | Admit: 2024-01-12 | Discharge: 2024-01-12 | Disposition: A | Discharge status: Home / Self Care | Attending: Emergency Medicine | Admitting: Emergency Medicine

## 2024-01-12 DIAGNOSIS — J069 Acute upper respiratory infection, unspecified: Secondary | ICD-10-CM | POA: Diagnosis not present

## 2024-01-12 MED ORDER — BENZONATATE 100 MG PO CAPS: 100.0000 mg | ORAL_CAPSULE | Freq: Three times a day (TID) | ORAL | 0 refills | Status: AC

## 2024-01-12 MED ORDER — AZITHROMYCIN 250 MG PO TABS: 250.0000 mg | ORAL_TABLET | Freq: Every day | ORAL | 0 refills | Status: AC

## 2024-01-12 MED ORDER — PROMETHAZINE-DM 6.25-15 MG/5ML PO SYRP: 5.0000 mL | ORAL_SOLUTION | Freq: Every evening | ORAL | 0 refills | Status: AC | PRN

## 2024-01-12 MED ORDER — PREDNISONE 10 MG (21) PO TBPK: ORAL_TABLET | Freq: Every day | ORAL | 0 refills | Status: AC

## 2024-01-12 NOTE — ED Triage Notes (Signed)
 Patient reports sore throat, sinus pressure, cough with yellow mucus and chest congestion. Rates sore throat 3/10. Patient cough syrup with mild relief.

## 2024-01-12 NOTE — ED Provider Notes (Signed)
 Sean Valencia    CSN: [id number] Arrival date & time: 01/12/24  9096      History   Chief Complaint Chief Complaint  Patient presents with   Cough   Sore Throat   Facial Swelling    HPI Sean Valencia is a 63 y.o. adult.   Patient presents for evaluation of nasal congestion, rhinorrhea, productive cough with brown to yellow sputum, sinus pressure to the cheeks and forehead, bilateral ear pressure, intermittent wheezing present for 3 days.  Initially experiencing have resolved.  Tolerable to his back no known sick contact prior.  Home COVID testing negative x 1.  Has attempted use of garlic and turmeric, course NyQuil.  Rest.  Past Medical History:  Diagnosis Date   Allergy    Anal fistula    Arthritis    History of kidney stones    HTN (hypertension)     Patient Active Problem List   Diagnosis Date Noted   Acute maxillary sinusitis 01/09/2022   Chronic constipation 01/09/2022   Perirectal abscess 01/09/2022   Urinary tract infectious disease 01/09/2022   Acute pancreatitis 11/02/2020   Elevated blood pressure reading 11/02/2020   Gallstones 11/02/2020   Gallstone pancreatitis 11/02/2020    Past Surgical History:  Procedure Laterality Date   CHOLECYSTECTOMY N/A 11/05/2020   Procedure: LAPAROSCOPIC CHOLECYSTECTOMY SINGLE SITE;  Surgeon: Sheldon Standing, MD;  Location: [facility];  Service: General;  Laterality: N/A;   ENDOSCOPIC RETROGRADE CHOLANGIOPANCREATOGRAPHY (ERCP) WITH PROPOFOL  N/A 11/04/2020   Procedure: ENDOSCOPIC RETROGRADE CHOLANGIOPANCREATOGRAPHY (ERCP) WITH PROPOFOL ;  Surgeon: Charlanne Groom, MD;  Location: [facility];  Service: Endoscopy;  Laterality: N/A;   INGUINAL HERNIA REPAIR Bilateral 1992   REMOVAL OF STONES  11/04/2020   Procedure: REMOVAL OF STONES;  Surgeon: Charlanne Groom, MD;  Location: [facility];  Service: Endoscopy;;   SPHINCTEROTOMY  11/04/2020   Procedure: ANNETT;  Surgeon: Charlanne Groom, MD;  Location: [facility];   Service: Endoscopy;;    OB History   No obstetric history on file.      Home Medications    Prior to Admission medications   Medication Sig Start Date End Date Taking? Authorizing Provider  azithromycin (ZITHROMAX) 250 MG tablet Take 1 tablet (250 mg total) by mouth daily. Take first 2 tablets together, then 1 every day until finished. 01/16/24  Yes Merissa Renwick R, NP  benzonatate (TESSALON) 100 MG capsule Take 1 capsule (100 mg total) by mouth every 8 (eight) hours. 01/12/24  Yes Olivia Pavelko R, NP  predniSONE  (STERAPRED UNI-PAK 21 TAB) 10 MG (21) TBPK tablet Take by mouth daily. Take 6 tabs by mouth daily  for 1 days, then 5 tabs for 1 days, then 4 tabs for 1 days, then 3 tabs for 1 days, 2 tabs for 1 days, then 1 tab by mouth daily for 1 days 01/12/24  Yes Sejal Cofield R, NP  promethazine -dextromethorphan (PROMETHAZINE -DM) 6.25-15 MG/5ML syrup Take 5 mLs by mouth at bedtime as needed. 01/12/24  Yes Ryann Leavitt R, NP  acetaminophen  (TYLENOL ) 500 MG tablet Take by mouth. Patient taking differently: Take 500 mg by mouth as needed.    [provider]  amLODipine  (NORVASC ) 10 MG tablet Take 1 tablet (10 mg total) by mouth daily. 11/09/20   Samtani, Jai-Gurmukh, MD  fluticasone  (FLONASE ) 50 MCG/ACT nasal spray Place 2 sprays into both nostrils daily. Patient taking differently: Place 2 sprays into both nostrils as needed. 11/07/22   Van Knee, MD  gentamicin  (GARAMYCIN ) 0.3 % ophthalmic solution Place  2 drops into both eyes as needed.    [provider]  LINZESS 145 MCG CAPS capsule Take 145 mcg by mouth daily. Patient not taking: No sig reported 11/02/21   [provider]  loratadine (CLARITIN) 10 MG tablet Take 10 mg by mouth daily as needed.    [provider]  naproxen (NAPROSYN) 500 MG tablet Take 500 mg by mouth 2 (two) times daily as needed. 03/11/22   [provider]  olopatadine  (PATANOL) 0.1 % ophthalmic solution Place 1  drop into both eyes as needed.    [provider]    Family History Family History  Problem Relation Age of Onset   Diabetes Father    Hypertension Father    Bone cancer Maternal Grandfather    Diabetes Paternal Grandmother    Diabetes Paternal Grandfather    Leukemia Paternal Grandfather    Hypercalcemia Brother    Colon cancer Neg Hx    Esophageal cancer Neg Hx    Stomach cancer Neg Hx    Rectal cancer Neg Hx     Social History Social History   Tobacco Use   Smoking status: Never   Smokeless tobacco: Never  Vaping Use   Vaping status: Never Used  Substance Use Topics   Alcohol use: Yes    Alcohol/week: 1.0 standard drink of alcohol    Types: 1 Glasses of wine per week    Comment: 4 per year   Drug use: No     Allergies   Patient has no known allergies.   Review of Systems Review of Systems  Respiratory:  Positive for cough.      Physical Exam Triage Vital Signs ED Triage Vitals  Encounter Vitals Group     BP 01/12/24 1139 133/74     Girls Systolic BP Percentile --      Girls Diastolic BP Percentile --      Boys Systolic BP Percentile --      Boys Diastolic BP Percentile --      Pulse Rate 01/12/24 1139 92     Resp 01/12/24 1139 18     Temp 01/12/24 1139 98.9 F (37.2 C)     Temp Source 01/12/24 1139 Oral     SpO2 01/12/24 1139 97 %     Weight --      Height --      Head Circumference --      Peak Flow --      Pain Score 01/12/24 1137 3     Pain Loc --      Pain Education --      Exclude from Growth Chart --    No data found.  Updated Vital Signs BP 133/74 (BP Location: Left Arm)   Pulse 92   Temp 98.9 F (37.2 C) (Oral)   Resp 18   SpO2 97%   Visual Acuity Right Eye Distance:   Left Eye Distance:   Bilateral Distance:    Right Eye Near:   Left Eye Near:    Bilateral Near:     Physical Exam Constitutional:      Appearance: Normal appearance.  HENT:     Head: Normocephalic.     Right Ear: Tympanic membrane, ear  canal and external ear normal.     Left Ear: Tympanic membrane, ear canal and external ear normal.     Nose: Congestion present.     Right Sinus: No maxillary sinus tenderness or frontal sinus tenderness.     Left  Sinus: No maxillary sinus tenderness or frontal sinus tenderness.     Mouth/Throat:     Mouth: Mucous membranes are moist.     Pharynx: Oropharynx is clear. No oropharyngeal exudate or posterior oropharyngeal erythema.  Eyes:     Extraocular Movements: Extraocular movements intact.  Cardiovascular:     Rate and Rhythm: Normal rate and regular rhythm.     Pulses: Normal pulses.     Heart sounds: Normal heart sounds.  Pulmonary:     Effort: Pulmonary effort is normal.     Breath sounds: Normal breath sounds.  Musculoskeletal:     Cervical back: Normal range of motion and neck supple.  Neurological:     Mental Status: He is alert and oriented to person, place, and time. Mental status is at baseline.      UC Treatments / Results  Labs (all labs ordered are listed, but only abnormal results are displayed) Labs Reviewed - No data to display  EKG   Radiology No results found.  Procedures Procedures (including critical care time)  Medications Ordered in UC Medications - No data to display  Initial Impression / Assessment and Plan / UC Course  I have reviewed the triage vital signs and the nursing notes.  Pertinent labs & imaging results that were available during my care of the patient were reviewed by me and considered in my medical decision making (see chart for details).   Viral URI with cough  Patient is in no signs of distress nor toxic appearing.  Vital signs are stable.  Low suspicion for pneumonia, pneumothorax or bronchitis and therefore will defer imaging.  Prescribed prednisone , Tessalon and Promethazine  DM, watch and wait antibiotic placed at pharmacy. May use additional over-the-counter medications as needed for supportive care.  May follow-up with  urgent care as needed if symptoms persist or worsen.   Final Clinical Impressions(s) / UC Diagnoses   Final diagnoses:  Viral URI with cough     Discharge Instructions      Your symptoms today are most likely being caused by a virus and should steadily improve in time it can take up to 7 to 10 days before you truly start to see a turnaround however things will get better, if no improvement by Wednesday may pick up antibiotic which will be available at the pharmacy  To help with sinus pressure and wheezing begin prednisone  every morning with food as directed  You may use Tessalon pill every 8 hours as needed for cough and may use cough syrup at bedtime to allow for    You can take Tylenol  and/or Ibuprofen as needed for fever reduction and pain relief.   For cough: honey 1/2 to 1 teaspoon (you can dilute the honey in water or another fluid).  You can also use guaifenesin and dextromethorphan for cough. You can use a humidifier for chest congestion and cough.  If you don't have a humidifier, you can sit in the bathroom with the hot shower running.      For sore throat: try warm salt water gargles, cepacol lozenges, throat spray, warm tea or water with lemon/honey, popsicles or ice, or OTC cold relief medicine for throat discomfort.   For congestion: take a daily anti-histamine like Zyrtec, Claritin, and a oral decongestant, such as pseudoephedrine.  You can also use Flonase  1-2 sprays in each nostril daily.   It is important to stay hydrated: drink plenty of fluids (water, gatorade/powerade/pedialyte, juices, or teas) to keep your throat moisturized and  help further relieve irritation/discomfort.    ED Prescriptions     Medication Sig Dispense Auth. Provider   benzonatate (TESSALON) 100 MG capsule Take 1 capsule (100 mg total) by mouth every 8 (eight) hours. 21 capsule Campbell Kray R, NP   promethazine -dextromethorphan (PROMETHAZINE -DM) 6.25-15 MG/5ML syrup Take 5 mLs by mouth at  bedtime as needed. 118 mL Chandy Tarman R, NP   predniSONE  (STERAPRED UNI-PAK 21 TAB) 10 MG (21) TBPK tablet Take by mouth daily. Take 6 tabs by mouth daily  for 1 days, then 5 tabs for 1 days, then 4 tabs for 1 days, then 3 tabs for 1 days, 2 tabs for 1 days, then 1 tab by mouth daily for 1 days 21 tablet Gyasi Hazzard R, NP   azithromycin (ZITHROMAX) 250 MG tablet Take 1 tablet (250 mg total) by mouth daily. Take first 2 tablets together, then 1 every day until finished. 6 tablet Alfreddie Consalvo R, NP      PDMP not reviewed this encounter.   Teresa Shelba SAUNDERS, NP 01/12/24 1318

## 2024-01-12 NOTE — Discharge Instructions (Signed)
 Your symptoms today are most likely being caused by a virus and should steadily improve in time it can take up to 7 to 10 days before you truly start to see a turnaround however things will get better, if no improvement by Wednesday may pick up antibiotic which will be available at the pharmacy  To help with sinus pressure and wheezing begin prednisone  every morning with food as directed  You may use Tessalon pill every 8 hours as needed for cough and may use cough syrup at bedtime to allow for    You can take Tylenol  and/or Ibuprofen as needed for fever reduction and pain relief.   For cough: honey 1/2 to 1 teaspoon (you can dilute the honey in water or another fluid).  You can also use guaifenesin and dextromethorphan for cough. You can use a humidifier for chest congestion and cough.  If you don't have a humidifier, you can sit in the bathroom with the hot shower running.      For sore throat: try warm salt water gargles, cepacol lozenges, throat spray, warm tea or water with lemon/honey, popsicles or ice, or OTC cold relief medicine for throat discomfort.   For congestion: take a daily anti-histamine like Zyrtec, Claritin, and a oral decongestant, such as pseudoephedrine.  You can also use Flonase  1-2 sprays in each nostril daily.   It is important to stay hydrated: drink plenty of fluids (water, gatorade/powerade/pedialyte, juices, or teas) to keep your throat moisturized and help further relieve irritation/discomfort.
